# Patient Record
Sex: Female | Born: 1945 | Race: White | Hispanic: No | Marital: Married | Smoking: Former smoker
Health system: Southern US, Community
[De-identification: ages and names within clinical notes are randomized; demographics above are authoritative.]

## PROBLEM LIST (undated history)

## (undated) DIAGNOSIS — R945 Abnormal results of liver function studies: Secondary | ICD-10-CM

## (undated) DIAGNOSIS — H9190 Unspecified hearing loss, unspecified ear: Secondary | ICD-10-CM

## (undated) DIAGNOSIS — I10 Essential (primary) hypertension: Secondary | ICD-10-CM

## (undated) DIAGNOSIS — R739 Hyperglycemia, unspecified: Secondary | ICD-10-CM

## (undated) DIAGNOSIS — K449 Diaphragmatic hernia without obstruction or gangrene: Secondary | ICD-10-CM

## (undated) DIAGNOSIS — J189 Pneumonia, unspecified organism: Secondary | ICD-10-CM

## (undated) DIAGNOSIS — K635 Polyp of colon: Secondary | ICD-10-CM

## (undated) DIAGNOSIS — C801 Malignant (primary) neoplasm, unspecified: Secondary | ICD-10-CM

## (undated) DIAGNOSIS — D510 Vitamin B12 deficiency anemia due to intrinsic factor deficiency: Secondary | ICD-10-CM

## (undated) DIAGNOSIS — J449 Chronic obstructive pulmonary disease, unspecified: Secondary | ICD-10-CM

## (undated) DIAGNOSIS — Z86010 Personal history of colonic polyps: Secondary | ICD-10-CM

## (undated) DIAGNOSIS — R7989 Other specified abnormal findings of blood chemistry: Secondary | ICD-10-CM

## (undated) DIAGNOSIS — E785 Hyperlipidemia, unspecified: Secondary | ICD-10-CM

## (undated) DIAGNOSIS — K759 Inflammatory liver disease, unspecified: Secondary | ICD-10-CM

## (undated) DIAGNOSIS — H269 Unspecified cataract: Secondary | ICD-10-CM

## (undated) DIAGNOSIS — R0600 Dyspnea, unspecified: Secondary | ICD-10-CM

## (undated) HISTORY — PX: CATARACT EXTRACTION W/ INTRAOCULAR LENS  IMPLANT, BILATERAL: SHX1307

## (undated) HISTORY — DX: Abnormal results of liver function studies: R94.5

## (undated) HISTORY — DX: Hyperlipidemia, unspecified: E78.5

## (undated) HISTORY — DX: Hyperglycemia, unspecified: R73.9

## (undated) HISTORY — DX: Unspecified hearing loss, unspecified ear: H91.90

## (undated) HISTORY — PX: TOENAIL EXCISION: SUR558

## (undated) HISTORY — DX: Essential (primary) hypertension: I10

## (undated) HISTORY — DX: Vitamin B12 deficiency anemia due to intrinsic factor deficiency: D51.0

## (undated) HISTORY — DX: Diaphragmatic hernia without obstruction or gangrene: K44.9

## (undated) HISTORY — DX: Polyp of colon: K63.5

## (undated) HISTORY — DX: Unspecified cataract: H26.9

## (undated) HISTORY — DX: Other specified abnormal findings of blood chemistry: R79.89

## (undated) HISTORY — PX: COLONOSCOPY: SHX174

## (undated) HISTORY — DX: Personal history of colonic polyps: Z86.010

---

## 1966-01-16 HISTORY — PX: APPENDECTOMY: SHX54

## 1966-01-16 HISTORY — PX: OVARIAN CYST SURGERY: SHX726

## 1977-01-16 HISTORY — PX: TUBAL LIGATION: SHX77

## 2003-10-07 ENCOUNTER — Encounter: Payer: Self-pay | Admitting: Internal Medicine

## 2004-01-17 LAB — HM COLONOSCOPY

## 2004-06-06 ENCOUNTER — Ambulatory Visit: Payer: Self-pay | Admitting: Internal Medicine

## 2004-06-07 ENCOUNTER — Ambulatory Visit: Payer: Self-pay | Admitting: Internal Medicine

## 2004-06-07 ENCOUNTER — Ambulatory Visit: Payer: Self-pay | Admitting: Gastroenterology

## 2004-06-22 ENCOUNTER — Ambulatory Visit: Payer: Self-pay | Admitting: Gastroenterology

## 2004-07-04 ENCOUNTER — Encounter: Payer: Self-pay | Admitting: Internal Medicine

## 2004-07-04 ENCOUNTER — Ambulatory Visit: Payer: Self-pay | Admitting: Gastroenterology

## 2004-07-04 ENCOUNTER — Encounter (INDEPENDENT_AMBULATORY_CARE_PROVIDER_SITE_OTHER): Payer: Self-pay | Admitting: Specialist

## 2004-07-13 ENCOUNTER — Ambulatory Visit: Payer: Self-pay | Admitting: Internal Medicine

## 2004-11-14 ENCOUNTER — Other Ambulatory Visit: Admission: RE | Admit: 2004-11-14 | Discharge: 2004-11-14 | Payer: Self-pay | Admitting: Obstetrics and Gynecology

## 2004-12-20 ENCOUNTER — Ambulatory Visit: Payer: Self-pay | Admitting: Internal Medicine

## 2005-01-23 ENCOUNTER — Ambulatory Visit: Payer: Self-pay | Admitting: Internal Medicine

## 2005-07-24 ENCOUNTER — Ambulatory Visit: Payer: Self-pay | Admitting: Internal Medicine

## 2005-07-31 ENCOUNTER — Ambulatory Visit: Payer: Self-pay | Admitting: Internal Medicine

## 2005-08-04 ENCOUNTER — Ambulatory Visit: Payer: Self-pay

## 2005-08-04 ENCOUNTER — Encounter: Payer: Self-pay | Admitting: Internal Medicine

## 2005-11-10 ENCOUNTER — Ambulatory Visit: Payer: Self-pay | Admitting: Internal Medicine

## 2006-01-31 ENCOUNTER — Ambulatory Visit: Payer: Self-pay | Admitting: Internal Medicine

## 2006-01-31 LAB — CONVERTED CEMR LAB
ALT: 33 units/L (ref 0–40)
AST: 29 units/L (ref 0–37)
Albumin: 3.9 g/dL (ref 3.5–5.2)
Alkaline Phosphatase: 65 units/L (ref 39–117)
BUN: 18 mg/dL (ref 6–23)
Bilirubin, Direct: 0.1 mg/dL (ref 0.0–0.3)
CO2: 28 meq/L (ref 19–32)
Calcium: 9.5 mg/dL (ref 8.4–10.5)
Chloride: 100 meq/L (ref 96–112)
Cholesterol: 163 mg/dL (ref 0–200)
Creatinine, Ser: 0.9 mg/dL (ref 0.4–1.2)
GFR calc Af Amer: 82 mL/min
GFR calc non Af Amer: 67 mL/min
Glucose, Bld: 124 mg/dL — ABNORMAL HIGH (ref 70–99)
HDL: 40.1 mg/dL (ref >39.0)
Hgb A1c MFr Bld: 6.3 % — ABNORMAL HIGH (ref 4.6–6.0)
LDL Cholesterol: 92 mg/dL (ref 0–99)
Potassium: 3.6 meq/L (ref 3.5–5.1)
Sodium: 139 meq/L (ref 135–145)
Total Bilirubin: 0.9 mg/dL (ref 0.3–1.2)
Total CHOL/HDL Ratio: 4.1
Total Protein: 6.7 g/dL (ref 6.0–8.3)
Triglycerides: 156 mg/dL — ABNORMAL HIGH (ref 0–149)
VLDL: 31 mg/dL (ref 0–40)

## 2006-02-13 ENCOUNTER — Ambulatory Visit: Payer: Self-pay | Admitting: Pulmonary Disease

## 2006-05-02 ENCOUNTER — Ambulatory Visit: Payer: Self-pay | Admitting: Internal Medicine

## 2006-05-02 LAB — CONVERTED CEMR LAB
ALT: 32 units/L (ref 0–40)
AST: 28 units/L (ref 0–37)
Albumin: 4.2 g/dL (ref 3.5–5.2)
Alkaline Phosphatase: 65 units/L (ref 39–117)
BUN: 14 mg/dL (ref 6–23)
Bilirubin, Direct: 0.1 mg/dL (ref 0.0–0.3)
CO2: 29 meq/L (ref 19–32)
Calcium: 9.5 mg/dL (ref 8.4–10.5)
Chloride: 102 meq/L (ref 96–112)
Cholesterol: 174 mg/dL (ref 0–200)
Creatinine, Ser: 0.8 mg/dL (ref 0.4–1.2)
GFR calc Af Amer: 93 mL/min
GFR calc non Af Amer: 77 mL/min
Glucose, Bld: 102 mg/dL — ABNORMAL HIGH (ref 70–99)
HDL: 42.5 mg/dL (ref >39.0)
Hgb A1c MFr Bld: 6.2 % — ABNORMAL HIGH (ref 4.6–6.0)
LDL Cholesterol: 101 mg/dL — ABNORMAL HIGH (ref 0–99)
Potassium: 3.6 meq/L (ref 3.5–5.1)
Sodium: 143 meq/L (ref 135–145)
Total Bilirubin: 0.8 mg/dL (ref 0.3–1.2)
Total CHOL/HDL Ratio: 4.1
Total Protein: 7.3 g/dL (ref 6.0–8.3)
Triglycerides: 151 mg/dL — ABNORMAL HIGH (ref 0–149)
VLDL: 30 mg/dL (ref 0–40)

## 2006-08-09 DIAGNOSIS — E785 Hyperlipidemia, unspecified: Secondary | ICD-10-CM | POA: Insufficient documentation

## 2006-08-09 DIAGNOSIS — I10 Essential (primary) hypertension: Secondary | ICD-10-CM | POA: Insufficient documentation

## 2006-08-09 DIAGNOSIS — Z860100 Personal history of colon polyps, unspecified: Secondary | ICD-10-CM

## 2006-08-09 DIAGNOSIS — M810 Age-related osteoporosis without current pathological fracture: Secondary | ICD-10-CM | POA: Insufficient documentation

## 2006-08-09 DIAGNOSIS — D510 Vitamin B12 deficiency anemia due to intrinsic factor deficiency: Secondary | ICD-10-CM | POA: Insufficient documentation

## 2006-08-09 HISTORY — DX: Personal history of colon polyps, unspecified: Z86.0100

## 2006-08-09 HISTORY — DX: Personal history of colonic polyps: Z86.010

## 2006-08-21 DIAGNOSIS — K7689 Other specified diseases of liver: Secondary | ICD-10-CM | POA: Insufficient documentation

## 2006-09-11 ENCOUNTER — Ambulatory Visit: Payer: Self-pay | Admitting: Internal Medicine

## 2006-09-12 LAB — CONVERTED CEMR LAB
BUN: 13 mg/dL (ref 6–23)
Basophils Absolute: 0.1 10*3/uL (ref 0.0–0.1)
Basophils Relative: 0.7 % (ref 0.0–1.0)
CO2: 30 meq/L (ref 19–32)
Calcium: 9.7 mg/dL (ref 8.4–10.5)
Chloride: 100 meq/L (ref 96–112)
Cholesterol: 168 mg/dL (ref 0–200)
Creatinine, Ser: 0.8 mg/dL (ref 0.4–1.2)
Direct LDL: 103.7 mg/dL
Eosinophils Absolute: 0.2 10*3/uL (ref 0.0–0.6)
Eosinophils Relative: 2.7 % (ref 0.0–5.0)
Folate: >20 ng/mL
GFR calc Af Amer: 93 mL/min
GFR calc non Af Amer: 77 mL/min
Glucose, Bld: 109 mg/dL — ABNORMAL HIGH (ref 70–99)
HCT: 42.4 % (ref 36.0–46.0)
HDL: 34.7 mg/dL — ABNORMAL LOW (ref >39.0)
Hemoglobin: 14.5 g/dL (ref 12.0–15.0)
Hgb A1c MFr Bld: 6.3 % — ABNORMAL HIGH (ref 4.6–6.0)
Lymphocytes Relative: 24.9 % (ref 12.0–46.0)
MCHC: 34.2 g/dL (ref 30.0–36.0)
MCV: 83.9 fL (ref 78.0–100.0)
Monocytes Absolute: 0.8 10*3/uL — ABNORMAL HIGH (ref 0.2–0.7)
Monocytes Relative: 9.7 % (ref 3.0–11.0)
Neutro Abs: 5 10*3/uL (ref 1.4–7.7)
Neutrophils Relative %: 62 % (ref 43.0–77.0)
Platelets: 260 10*3/uL (ref 150–400)
Potassium: 4.5 meq/L (ref 3.5–5.1)
RBC: 5.06 M/uL (ref 3.87–5.11)
RDW: 12.8 % (ref 11.5–14.6)
Sodium: 138 meq/L (ref 135–145)
Total CHOL/HDL Ratio: 4.8
Triglycerides: 201 mg/dL (ref 0–149)
VLDL: 40 mg/dL (ref 0–40)
Vitamin B-12: 336 pg/mL (ref 211–911)
WBC: 8.1 10*3/uL (ref 4.5–10.5)

## 2006-10-26 ENCOUNTER — Ambulatory Visit: Payer: Self-pay | Admitting: Internal Medicine

## 2007-01-22 ENCOUNTER — Ambulatory Visit: Payer: Self-pay | Admitting: Internal Medicine

## 2007-01-22 LAB — CONVERTED CEMR LAB
ALT: 31 units/L (ref 0–35)
AST: 25 units/L (ref 0–37)
Alkaline Phosphatase: 63 units/L (ref 39–117)
Chloride: 103 meq/L (ref 96–112)
Cholesterol: 178 mg/dL (ref 0–200)
Creatinine, Ser: 0.9 mg/dL (ref 0.4–1.2)
Glucose, Bld: 113 mg/dL — ABNORMAL HIGH (ref 70–99)
HDL: 36.3 mg/dL — ABNORMAL LOW (ref >39.0)
LDL Cholesterol: 109 mg/dL — ABNORMAL HIGH (ref 0–99)
Sodium: 141 meq/L (ref 135–145)
Total Bilirubin: 0.9 mg/dL (ref 0.3–1.2)
Total Protein: 6.5 g/dL (ref 6.0–8.3)
Triglycerides: 164 mg/dL — ABNORMAL HIGH (ref 0–149)

## 2007-02-01 ENCOUNTER — Ambulatory Visit: Payer: Self-pay | Admitting: Internal Medicine

## 2007-05-24 ENCOUNTER — Ambulatory Visit: Payer: Self-pay | Admitting: Internal Medicine

## 2007-08-01 ENCOUNTER — Ambulatory Visit: Payer: Self-pay | Admitting: Internal Medicine

## 2007-08-01 LAB — CONVERTED CEMR LAB
AST: 27 units/L (ref 0–37)
BUN: 15 mg/dL (ref 6–23)
Chloride: 99 meq/L (ref 96–112)
Cholesterol: 169 mg/dL (ref 0–200)
Creatinine, Ser: 0.8 mg/dL (ref 0.4–1.2)
GFR calc non Af Amer: 77 mL/min
LDL Cholesterol: 103 mg/dL — ABNORMAL HIGH (ref 0–99)
Total Protein: 7 g/dL (ref 6.0–8.3)
Triglycerides: 146 mg/dL (ref 0–149)
VLDL: 29 mg/dL (ref 0–40)

## 2007-08-13 ENCOUNTER — Ambulatory Visit: Payer: Self-pay | Admitting: Internal Medicine

## 2007-10-30 ENCOUNTER — Ambulatory Visit: Payer: Self-pay | Admitting: Internal Medicine

## 2008-01-28 ENCOUNTER — Ambulatory Visit: Payer: Self-pay | Admitting: Internal Medicine

## 2008-01-28 LAB — CONVERTED CEMR LAB
ALT: 28 units/L (ref 0–35)
AST: 25 units/L (ref 0–37)
Albumin: 4 g/dL (ref 3.5–5.2)
Alkaline Phosphatase: 65 units/L (ref 39–117)
BUN: 20 mg/dL (ref 6–23)
Bilirubin, Direct: 0.1 mg/dL (ref 0.0–0.3)
CO2: 32 meq/L (ref 19–32)
Chloride: 101 meq/L (ref 96–112)
Glucose, Bld: 114 mg/dL — ABNORMAL HIGH (ref 70–99)
HDL: 39.7 mg/dL (ref >39.0)
Potassium: 4.3 meq/L (ref 3.5–5.1)
Sodium: 141 meq/L (ref 135–145)
Total Protein: 6.9 g/dL (ref 6.0–8.3)

## 2008-02-03 ENCOUNTER — Ambulatory Visit: Payer: Self-pay | Admitting: Internal Medicine

## 2008-04-27 ENCOUNTER — Ambulatory Visit: Payer: Self-pay | Admitting: Internal Medicine

## 2008-04-27 LAB — CONVERTED CEMR LAB
ALT: 25 units/L (ref 0–35)
AST: 23 units/L (ref 0–37)
Bilirubin, Direct: 0.1 mg/dL (ref 0.0–0.3)
Cholesterol: 147 mg/dL (ref 0–200)
Total Bilirubin: 0.7 mg/dL (ref 0.3–1.2)
Total Protein: 7.1 g/dL (ref 6.0–8.3)

## 2008-05-04 ENCOUNTER — Ambulatory Visit: Payer: Self-pay | Admitting: Internal Medicine

## 2008-05-21 ENCOUNTER — Ambulatory Visit: Payer: Self-pay | Admitting: Internal Medicine

## 2008-07-27 ENCOUNTER — Ambulatory Visit: Payer: Self-pay | Admitting: Internal Medicine

## 2008-07-28 LAB — CONVERTED CEMR LAB
AST: 26 units/L (ref 0–37)
Alkaline Phosphatase: 60 units/L (ref 39–117)
BUN: 16 mg/dL (ref 6–23)
Basophils Absolute: 0.1 10*3/uL (ref 0.0–0.1)
Bilirubin, Direct: 0 mg/dL (ref 0.0–0.3)
Chloride: 104 meq/L (ref 96–112)
Cholesterol: 183 mg/dL (ref 0–200)
Creatinine, Ser: 0.7 mg/dL (ref 0.4–1.2)
Direct LDL: 105.8 mg/dL
Eosinophils Absolute: 0.2 10*3/uL (ref 0.0–0.7)
Eosinophils Relative: 2.8 % (ref 0.0–5.0)
Glucose, Bld: 117 mg/dL — ABNORMAL HIGH (ref 70–99)
Hemoglobin: 14.5 g/dL (ref 12.0–15.0)
Lymphs Abs: 1.7 10*3/uL (ref 0.7–4.0)
MCHC: 34.2 g/dL (ref 30.0–36.0)
MCV: 85.1 fL (ref 78.0–100.0)
Monocytes Absolute: 0.6 10*3/uL (ref 0.1–1.0)
Neutro Abs: 4.7 10*3/uL (ref 1.4–7.7)
Potassium: 4.7 meq/L (ref 3.5–5.1)
RBC: 4.99 M/uL (ref 3.87–5.11)
Total Protein: 7.3 g/dL (ref 6.0–8.3)
VLDL: 56.2 mg/dL — ABNORMAL HIGH (ref 0.0–40.0)

## 2008-08-31 ENCOUNTER — Telehealth: Payer: Self-pay | Admitting: Internal Medicine

## 2008-11-25 ENCOUNTER — Telehealth: Payer: Self-pay | Admitting: Internal Medicine

## 2008-11-25 DIAGNOSIS — H9190 Unspecified hearing loss, unspecified ear: Secondary | ICD-10-CM

## 2008-11-25 HISTORY — DX: Unspecified hearing loss, unspecified ear: H91.90

## 2008-12-09 ENCOUNTER — Encounter (INDEPENDENT_AMBULATORY_CARE_PROVIDER_SITE_OTHER): Payer: Self-pay | Admitting: *Deleted

## 2009-01-16 HISTORY — PX: OTHER SURGICAL HISTORY: SHX169

## 2009-01-16 HISTORY — PX: RETINAL DETACHMENT SURGERY: SHX105

## 2009-01-20 ENCOUNTER — Ambulatory Visit: Payer: Self-pay | Admitting: Internal Medicine

## 2009-01-20 LAB — CONVERTED CEMR LAB
Alkaline Phosphatase: 53 units/L (ref 39–117)
BUN: 15 mg/dL (ref 6–23)
Bilirubin, Direct: 0 mg/dL (ref 0.0–0.3)
CO2: 30 meq/L (ref 19–32)
Chloride: 104 meq/L (ref 96–112)
Cholesterol: 159 mg/dL (ref 0–200)
Creatinine, Ser: 0.8 mg/dL (ref 0.4–1.2)
Glucose, Bld: 116 mg/dL — ABNORMAL HIGH (ref 70–99)
LDL Cholesterol: 84 mg/dL (ref 0–99)
Potassium: 4.5 meq/L (ref 3.5–5.1)
Total Bilirubin: 0.9 mg/dL (ref 0.3–1.2)
VLDL: 33.4 mg/dL (ref 0.0–40.0)

## 2009-01-28 ENCOUNTER — Ambulatory Visit: Payer: Self-pay | Admitting: Internal Medicine

## 2009-01-29 ENCOUNTER — Telehealth: Payer: Self-pay | Admitting: Gastroenterology

## 2009-01-29 ENCOUNTER — Ambulatory Visit: Payer: Self-pay | Admitting: Gastroenterology

## 2009-01-29 LAB — CONVERTED CEMR LAB
Basophils Relative: 0.7 % (ref 0.0–3.0)
Eosinophils Relative: 2.4 % (ref 0.0–5.0)
Folate: >20 ng/mL
Hemoglobin: 13.9 g/dL (ref 12.0–15.0)
Iron: 58 ug/dL (ref 42–145)
Lymphocytes Relative: 19.3 % (ref 12.0–46.0)
Lymphs Abs: 1.7 10*3/uL (ref 0.7–4.0)
MCHC: 32.3 g/dL (ref 30.0–36.0)
MCV: 87.7 fL (ref 78.0–100.0)
Monocytes Absolute: 0.7 10*3/uL (ref 0.1–1.0)
Monocytes Relative: 7.7 % (ref 3.0–12.0)
Neutro Abs: 6.1 10*3/uL (ref 1.4–7.7)
RBC: 4.93 M/uL (ref 3.87–5.11)

## 2009-02-02 ENCOUNTER — Encounter (INDEPENDENT_AMBULATORY_CARE_PROVIDER_SITE_OTHER): Payer: Self-pay | Admitting: *Deleted

## 2009-02-05 ENCOUNTER — Ambulatory Visit: Payer: Self-pay | Admitting: Gastroenterology

## 2009-02-05 DIAGNOSIS — K449 Diaphragmatic hernia without obstruction or gangrene: Secondary | ICD-10-CM

## 2009-02-05 HISTORY — DX: Diaphragmatic hernia without obstruction or gangrene: K44.9

## 2009-02-09 ENCOUNTER — Ambulatory Visit (HOSPITAL_COMMUNITY): Admission: RE | Admit: 2009-02-09 | Discharge: 2009-02-09 | Payer: Self-pay | Admitting: Gastroenterology

## 2009-02-12 ENCOUNTER — Encounter: Payer: Self-pay | Admitting: Gastroenterology

## 2009-06-09 ENCOUNTER — Encounter (INDEPENDENT_AMBULATORY_CARE_PROVIDER_SITE_OTHER): Payer: Self-pay | Admitting: *Deleted

## 2009-07-23 ENCOUNTER — Ambulatory Visit: Payer: Self-pay | Admitting: Internal Medicine

## 2009-07-23 LAB — CONVERTED CEMR LAB
ALT: 23 units/L (ref 0–35)
AST: 21 units/L (ref 0–37)
Albumin: 4.2 g/dL (ref 3.5–5.2)
Alkaline Phosphatase: 62 units/L (ref 39–117)
CO2: 31 meq/L (ref 19–32)
Calcium: 9.3 mg/dL (ref 8.4–10.5)
Cholesterol: 166 mg/dL (ref 0–200)
Creatinine, Ser: 0.7 mg/dL (ref 0.4–1.2)
GFR calc non Af Amer: 84.79 mL/min (ref >60)
HDL: 46.6 mg/dL (ref >39.00)
Hgb A1c MFr Bld: 6.3 % (ref 4.6–6.5)
Sodium: 143 meq/L (ref 135–145)
Total CHOL/HDL Ratio: 4
Total Protein: 7.1 g/dL (ref 6.0–8.3)
Triglycerides: 138 mg/dL (ref 0.0–149.0)

## 2009-07-28 ENCOUNTER — Ambulatory Visit: Payer: Self-pay | Admitting: Internal Medicine

## 2009-07-28 DIAGNOSIS — M79609 Pain in unspecified limb: Secondary | ICD-10-CM | POA: Insufficient documentation

## 2009-07-29 ENCOUNTER — Telehealth (INDEPENDENT_AMBULATORY_CARE_PROVIDER_SITE_OTHER): Payer: Self-pay | Admitting: *Deleted

## 2009-08-02 ENCOUNTER — Encounter: Payer: Self-pay | Admitting: Cardiovascular Disease

## 2009-08-02 ENCOUNTER — Ambulatory Visit: Payer: Self-pay

## 2009-08-02 ENCOUNTER — Encounter (HOSPITAL_COMMUNITY): Admission: RE | Admit: 2009-08-02 | Discharge: 2009-09-22 | Payer: Self-pay | Admitting: Internal Medicine

## 2009-08-02 ENCOUNTER — Ambulatory Visit: Payer: Self-pay | Admitting: Cardiovascular Disease

## 2009-08-03 ENCOUNTER — Encounter: Payer: Self-pay | Admitting: Internal Medicine

## 2009-08-09 ENCOUNTER — Telehealth: Payer: Self-pay | Admitting: *Deleted

## 2010-01-16 LAB — HM MAMMOGRAPHY

## 2010-02-09 ENCOUNTER — Ambulatory Visit
Admission: RE | Admit: 2010-02-09 | Discharge: 2010-02-09 | Payer: Self-pay | Source: Home / Self Care | Attending: Internal Medicine | Admitting: Internal Medicine

## 2010-02-09 ENCOUNTER — Other Ambulatory Visit: Payer: Self-pay | Admitting: Internal Medicine

## 2010-02-09 LAB — BASIC METABOLIC PANEL
BUN: 19 mg/dL (ref 6–23)
Creatinine, Ser: 0.7 mg/dL (ref 0.4–1.2)
GFR: 87.41 mL/min (ref >60.00)

## 2010-02-09 LAB — HEPATIC FUNCTION PANEL
AST: 20 U/L (ref 0–37)
Alkaline Phosphatase: 56 U/L (ref 39–117)
Bilirubin, Direct: 0.1 mg/dL (ref 0.0–0.3)
Total Bilirubin: 0.8 mg/dL (ref 0.3–1.2)

## 2010-02-09 LAB — LIPID PANEL
LDL Cholesterol: 79 mg/dL (ref 0–99)
VLDL: 35.2 mg/dL (ref 0.0–40.0)

## 2010-02-14 ENCOUNTER — Ambulatory Visit: Admit: 2010-02-14 | Payer: Self-pay | Admitting: Internal Medicine

## 2010-02-15 NOTE — Progress Notes (Signed)
Summary: Triage   Phone Note Call from Patient Call back at Home Phone [phone]   Caller: Patient Call For: Dr. Jarold Motto Reason for Call: Talk to Nurse Summary of Call: Pt. is calling about scheduling a ultrasound at [facility]  Initial call taken by: Karna Christmas,  January 29, 2009 11:17 AM  Follow-up for Phone Call        Left message for pt to call.  Lupita Leash Surface RN  January 29, 2009 12:55 PM  Egd and [place] scheduled.  Pt aware of appts.  Pt will stop by tomorrow for instructions re Egd.  Follow-up by: Ashok Cordia RN,  February 02, 2009 11:55 AM

## 2010-02-15 NOTE — Assessment & Plan Note (Signed)
Summary: 6 month rov/njr   Vital Signs:  Patient profile:   65 year old female Weight:      179 pounds Temp:     98.6 degrees F oral Pulse rate:   68 / minute Pulse rhythm:   regular Resp:     14 per minute BP sitting:   126 / 74  (left arm) Cuff size:   regular  Vitals Entered By: Gladis Riffle, RN (July 28, 2009 9:11 AM) CC: 6 month rov, labs done-- Is Patient Diabetic? No   Primary Care Provider:  Birdie Sons, MD  CC:  6 month rov and labs done--.  History of Present Illness:  Follow-Up Visit      This is a 65 year old woman who presents for Follow-up visit.  The patient denies chest pain and palpitations.  Since the last visit the patient notes no new problems or concerns---has continued tingling and subjective weakness of left hand (she is right handed).  The patient reports taking meds as prescribed.  When questioned about possible medication side effects, the patient notes none.    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 Take 1 Tablet By Mouth Every Other Day 2)  Crestor 20 Mg Tabs (Rosuvastatin Calcium) .Marland Kitchen.. 1 Tablet By Mouth Daily or As Directed 3)  Cyanocobalamin 1000 Mcg/ml Inj Soln (Cyanocobalamin) .Marland Kitchen.. 1033mcg/month Subcutaneously 4)  Caltrate 600+d 600-400 Mg-Unit  Tabs (Calcium Carbonate-Vitamin D) .... Once Daily 5)  Vitamin D 1000 Unit  Caps (Cholecalciferol) .... Once Daily 6)  Micardis Hct 80-25 Mg Tabs (Telmisartan-Hctz) .Marland Kitchen.. 1 By  Mouth Daily or As Directed 7)  Levsin/sl 0.125 Mg  Subl (Hyoscyamine Sulfate) .Marland Kitchen.. 1 Sl Q 4-6 Hrs Prn  Allergies (verified): 1)  ! Keflex (Cephalexin)  Past History:  Past Medical History: Last updated: 08/21/2006 Hyperlipidemia Hypertension Osteoporosis Colonic polyps, hx of pernicious anemia elevated LFTs  obstructive disease  Family History: Last updated: 01/29/2009 mother alive pernicious anemai (hx of anemia) father-decease 82 yo lung cancer/bone cancer Family History Osteoporosis hypothyroidism No FH of Colon Cancer:  Social History: Last updated: 01/29/2009 Retired  Charity fundraiser  Married  2 boys and 1 girl Former Smoker Alcohol Use - yes  2-3 per week Illicit Drug Use - no  Risk Factors: Smoking Status: quit (07/28/2009)  Past Surgical History: ovarian cyst, 1968 Appendectomy, incendental  1968 Cataract Surgery bilateral left retina surgery  Physical Exam  General:  alert and well-developed.   Head:  normocephalic and atraumatic.   Eyes:  pupils equal and pupils round.   Ears:  R ear normal and L ear normal.   Neck:  No deformities, masses, or tenderness noted. Chest Wall:  No deformities, masses, or tenderness noted. Lungs:  Normal respiratory effort, chest expands symmetrically. Lungs are clear to auscultation, no crackles or wheezes. Heart:  normal rate and regular rhythm.   Abdomen:  Bowel sounds positive,abdomen soft and non-tender without masses, organomegaly or hernias noted.  overweight Msk:  normal  ROM and no joint tenderness.   Neurologic:  cranial nerves II-XII intact and strength normal in all extremities.   Skin:  turgor normal and color normal.     Impression & Recommendations:  Problem # 1:  HYPERGLYCEMIA (ICD-790.29) labs ok Orders: Cardiolite (Cardiolite)  Problem # 2:  DISORDER, LIVER NEC (ICD-573.8) labs normal  Problem # 3:  ANEMIA, PERNICIOUS (ICD-281.0)  continue treatment Her updated medication list for this problem  includes:    Cyanocobalamin 1000 Mcg/ml Inj Soln (Cyanocobalamin) .Marland KitchenMarland KitchenMarland KitchenMarland Kitchen 1054mcg/month IM  Orders: Cardiolite (Cardiolite)  Problem # 4:  HYPERTENSION (ICD-401.9) controlled continue current medications  Her updated medication list for this problem includes:    Micardis Hct 80-25 Mg Tabs (Telmisartan-hctz) .Marland Kitchen... 1 by mouth daily or as directed  BP today: 126/74 Prior BP: 120/68 (01/29/2009)  Labs Reviewed: K+: 5.1 (07/23/2009) Creat: : 0.7 (07/23/2009)   Chol: 166 (07/23/2009)   HDL: 46.60 (07/23/2009)   LDL: 92 (07/23/2009)   TG: 138.0 (07/23/2009)  Orders: Cardiolite (Cardiolite)  Complete Medication List: 1)  Aspirin Ec 81 Mg Tbec (Aspirin) .... Take 1 tablet by mouth every other day 2)  Crestor 20 Mg Tabs (Rosuvastatin calcium) .Marland Kitchen.. 1 tablet by mouth daily or as directed 3)  Cyanocobalamin 1000 Mcg/ml Inj Soln (Cyanocobalamin) .Marland Kitchen.. 1040mcg/month subcutaneously 4)  Caltrate 600+d 600-400 Mg-unit Tabs (Calcium carbonate-vitamin d) .... Once daily 5)  Vitamin D 1000 Unit Caps (cholecalciferol)  .... Once daily 6)  Micardis Hct 80-25 Mg Tabs (Telmisartan-hctz) .Marland Kitchen.. 1 by mouth daily or as directed 7)  Levsin/sl 0.125 Mg Subl (Hyoscyamine sulfate) .Marland Kitchen.. 1 sl q 4-6 hrs prn  Other Orders: Orthopedic Referral (Ortho)  Patient Instructions: 1)  Please schedule a follow-up appointment in 6 months. 2)  labs one week prior to visit 3)  lipids---272.4 4)  lfts-995.2 5)  bmet-995.2 6)  A1C-250.02 7)

## 2010-02-15 NOTE — Assessment & Plan Note (Signed)
Summary: 6 mo rov/mm   Vital Signs:  Patient profile:   66 year old female Weight:      185 pounds Temp:     98.6 degrees F Pulse rate:   68 / minute BP sitting:   124 / 76  (left arm)  Vitals Entered By: Gladis Riffle, RN (January 28, 2009 8:08 AM)   History of Present Illness:  Follow-Up Visit      This is a 65 year old woman who presents for Follow-up visit.  The patient denies chest pain, palpitations, dizziness, syncope, edema, SOB, DOE, PND, and orthopnea.  Since the last visit the patient notes no new problems or concerns.  The patient reports taking meds as prescribed.  When questioned about possible medication side effects, the patient notes none.    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 Take 1 Tablet By Mouth Once A Week 2)  Aspirin Ec 81 Mg Tbec (Aspirin) .... Take 1 Tablet By Mouth Every Other Day 3)  Crestor 20 Mg Tabs (Rosuvastatin Calcium) .Marland Kitchen.. 1 Tablet By Mouth Daily or As Directed 4)  Cyanocobalamin 1000 Mcg/ml Inj Soln (Cyanocobalamin) .Marland Kitchen.. 1077mcg/month Subcutaneously 5)  Caltrate 600+d 600-400 Mg-Unit  Tabs (Calcium Carbonate-Vitamin D) .... Once Daily 6)  Vitamin D 1000 Unit  Caps (Cholecalciferol) .... Once Daily 7)  Cinnamon 500 Mg  Caps (Cinnamon) .... Two Times A Day--Does Not Always Remember 8)  Micardis Hct 80-25 Mg Tabs (Telmisartan-Hctz) .Marland Kitchen.. 1 By Mouth Daily or As Directed  Allergies: 1)  !  Keflex (Cephalexin)  Comments:  Nurse/Medical Assistant: 6 month rov, labs done  The patient's medications and allergies were reviewed with the patient and were updated in the Medication and Allergy Lists. Gladis Riffle, RN (January 28, 2009 8:09 AM)  Past History:  Past Medical History: Last updated: 08/21/2006 Hyperlipidemia Hypertension Osteoporosis Colonic polyps, hx of pernicious anemia elevated LFTs  obstructive disease  Past Surgical History: Last updated: 08/09/2006 ovarian cyst, 1968 Appendectomy, incendental  1968  Family History: Last updated: 09/11/2006 mother alive pernicious anemai (hx of anemia) father-decease 70 yo lung cancer/bone cancer Family History Osteoporosis hypothyroidism  Social History: Last updated: 09/11/2006 Retired Married Former Smoker  Risk Factors: Smoking Status: quit (01/28/2009)  Review of Systems       All other systems reviewed and were negative   Physical Exam  General:  Well-developed,well-nourished,in no acute distress; alert,appropriate and cooperative throughout examination Head:  normocephalic and atraumatic.   Ears:  R ear normal and L ear normal.   Nose:  no external deformity and no external erythema.   Neck:  No deformities, masses, or tenderness noted. Chest Wall:  No deformities, masses, or tenderness noted. Lungs:  normal respiratory effort and no intercostal retractions.   Heart:  Normal rate and regular rhythm. S1 and S2 normal without gallop, murmur, click, rub or other extra sounds. Abdomen:  Bowel sounds positive,abdomen soft and non-tender without masses, organomegaly or hernias noted.  overweight Msk:  No deformity or scoliosis noted of thoracic or lumbar spine.   Pulses:  R radial normal and L radial normal.   Neurologic:  cranial nerves II-XII intact and gait normal.     Impression & Recommendations:  Problem # 1:  ANEMIA, PERNICIOUS (ICD-281.0) continue B12 injections The following  medications were removed from the medication list:    Folic Acid 1 Mg Tabs (Folic acid) .Marland Kitchen... Take 1 tablet by mouth once a day Her updated medication list for this problem includes:    Cyanocobalamin 1000 Mcg/ml Inj Soln (Cyanocobalamin) .Marland KitchenMarland KitchenMarland KitchenMarland Kitchen 1043mcg/month subcutaneously  Problem # 2:  HYPERTENSION (ICD-401.9) controlled continue current medications  Her updated medication list for this problem includes:    Micardis Hct 80-25 Mg Tabs (Telmisartan-hctz) .Marland Kitchen... 1 by mouth daily or as directed  BP today: 124/76 Prior BP: 114/76 (07/27/2008)  Labs Reviewed: K+: 4.5 (01/20/2009) Creat: : 0.8 (01/20/2009)   Chol: 159 (01/20/2009)   HDL: 41.60 (01/20/2009)   LDL: 84 (01/20/2009)   TG: 167.0 (01/20/2009)  Problem # 3:  HYPERLIPIDEMIA (ICD-272.4) controlled continue current medications  Her updated medication list for this problem includes:    Crestor 20 Mg Tabs (Rosuvastatin calcium) .Marland Kitchen... 1 tablet by mouth daily or as directed  Labs Reviewed: SGOT: 28 (01/20/2009)   SGPT: 29 (01/20/2009)   HDL:41.60 (01/20/2009), 40.70 (07/27/2008)  LDL:84 (01/20/2009), 78 (04/27/2008)  Chol:159 (01/20/2009), 183 (07/27/2008)  Trig:167.0 (01/20/2009), 281.0 (07/27/2008)  Problem # 4:  HYPERGLYCEMIA (ICD-790.29) note A1C will follow occassionally  Problem # 5:  ABDOMINAL PAIN (ICD-789.00)  Orders: Gastroenterology Referral (GI)  Complete Medication List: 1)  Alendronate Sodium 70 Mg Tabs (Alendronate sodium) .... Take 1 tablet by mouth once a week 2)  Aspirin Ec 81 Mg Tbec (Aspirin) .... Take 1 tablet by mouth every other day 3)  Crestor 20 Mg Tabs (Rosuvastatin calcium) .Marland Kitchen.. 1 tablet by mouth daily or as directed 4)  Cyanocobalamin 1000 Mcg/ml Inj Soln (Cyanocobalamin) .Marland Kitchen.. 1039mcg/month subcutaneously 5)  Caltrate 600+d 600-400 Mg-unit Tabs (Calcium carbonate-vitamin d) .... Once daily 6)  Vitamin D 1000 Unit Caps (cholecalciferol)  .... Once daily 7)  Micardis Hct 80-25 Mg Tabs  (Telmisartan-hctz) .Marland Kitchen.. 1 by mouth daily or as directed 8)  Levsin/sl 0.125 Mg Subl (Hyoscyamine sulfate) .Marland Kitchen.. 1 sl q 4-6 hrs prn  Other Orders: Pneumococcal Vaccine ([address]) Admin 1st Vaccine (60454)  Patient Instructions: 1)  Please schedule a follow-up appointment in 6 months. 2)  labs one week prior to visit 3)  lipids---272.4 4)  lfts-995.2 5)  bmet-995.2 6)  A1C-250.02 7)       Immunizations Administered:  Pneumonia Vaccine:    Vaccine Type: Pneumovax (Medicare)    Site: left deltoid    Mfr: Merck    Dose: 0.5 ml    Route: IM    Given by: Gladis Riffle, RN    Exp. Date: 02/11/2010    Lot #: [id number]    Immunizations Administered:  Pneumonia Vaccine:    Vaccine Type: Pneumovax (Medicare)    Site: left deltoid    Mfr: Merck    Dose: 0.5 ml    Route: IM    Given by: Gladis Riffle, RN    Exp. Date: 02/11/2010    Lot #: [id number]

## 2010-02-15 NOTE — Progress Notes (Signed)
Summary: Nuclear Pre-Procedure  Phone Note Outgoing Call Call back at [facility] Phone [phone]   Call placed by: Stanton Kidney, EMT-P,  July 29, 2009 4:00 PM Action Taken: Phone Call Completed Summary of Call: Left message with information on [employer] Information Sheet (see scanned document for details).     Nuclear Med Background Indications for Stress Test: Evaluation for Ischemia   History: Myocardial Perfusion Study  History Comments: '07 MPS: EF=74%, NL  Symptoms: SOB    Nuclear Pre-Procedure Cardiac Risk Factors: History of Smoking, Hypertension, Lipids Height (in): 64

## 2010-02-15 NOTE — Letter (Signed)
Summary: EGD Instructions  [facility]  [address] [place], [place] [address]   Phone: [phone]  Fax: [phone]       Cassandra James    1945/07/22    MRN: [id number]       Procedure Day Dorna Bloom: Firday, 02/05/09     Arrival Time: 2:30     Procedure Time: 3:30     Location of Procedure:                    _ X _ [facility] (4th Floor)     PREPARATION FOR ENDOSCOPY   On 02/05/09 THE DAY OF THE PROCEDURE:  1.   No solid foods, milk or milk products are allowed after midnight the night before your procedure.  2.   Do not drink anything colored red or purple.  Avoid juices with pulp.  No orange juice.  3.  You may drink clear liquids until 1:30 pm, which is 2 hours before your procedure.                                                                                                CLEAR LIQUIDS INCLUDE: Water Jello Ice Popsicles Tea (sugar ok, no milk/cream) Powdered fruit flavored drinks Coffee (sugar ok, no milk/cream) Gatorade Juice: apple, white grape, white cranberry  Lemonade Clear bullion, consomm, broth Carbonated beverages (any kind) Strained chicken noodle soup Hard Candy   MEDICATION INSTRUCTIONS  Unless otherwise instructed, you should take regular prescription medications with a small sip of water as early as possible the morning of your procedure.                     OTHER INSTRUCTIONS  You will need a responsible adult at least 67 years of age to accompany you and drive you home.   This person must remain in the waiting room during your procedure.  Wear loose fitting clothing that is easily removed.  Leave jewelry and other valuables at home.  However, you may wish to bring a book to read or an iPod/MP3 player to listen to music as you wait for your procedure to start.  Remove all body piercing jewelry and leave at home.  Total time from sign-in until discharge is approximately 2-3 hours.  You  should go home directly after your procedure and rest.  You can resume normal activities the day after your procedure.  The day of your procedure you should not:   Drive   Make legal decisions   Operate machinery   Drink alcohol   Return to work  You will receive specific instructions about eating, activities and medications before you leave.    The above instructions have been reviewed and explained to me by   _______________________    I fully understand and can verbalize these instructions _____________________________ Date _________

## 2010-02-15 NOTE — Progress Notes (Signed)
Summary: resend rx to a different pharmacy  Phone Note Call from Patient Call back at Home Phone [phone]   Caller: Patient---live call Summary of Call: [facility] is out of b12 vax. [facility]. Initial call taken by: Warnell Forester,  August 09, 2009 4:48 PM    Prescriptions: CYANOCOBALAMIN 1000 MCG/ML INJ SOLN (CYANOCOBALAMIN) 1043mcg/month subcutaneously  #12 Millilite x 0   Entered by:   Kern Reap CMA (AAMA)   Authorized by:   Birdie Sons MD   Signed by:   Kern Reap CMA (AAMA) on 08/09/2009   Method used:   Electronically to        [facility]  [place] [address] (retail)       [address]       [place], [place]  [address]       Ph: [phone] or [phone]       Fax: [phone]   RxID:   [id number]

## 2010-02-15 NOTE — Miscellaneous (Signed)
Summary: h pylori  Clinical Lists Changes  Problems: Added new problem of HIATAL HERNIA (ICD-553.3) Orders: Added new Test order of TLB-H Pylori Screen Gastric Biopsy (83013-CLOTEST) - Signed

## 2010-02-15 NOTE — Assessment & Plan Note (Signed)
Summary: CHRONIC ABD PAIN/YF    History of Present Illness Visit Type: Initial Consult Primary GI MD: Sheryn Bison MD FACP FAGA Primary Provider: Birdie Sons, MD Requesting Provider: Birdie Sons, MD Chief Complaint: severe abdominal pain upper abdomen  History of Present Illness:   65 year old white female with 5 years of recurrent intermittent left upper quadrant pain precipitated by movement which will last 15-20 minutes in duration this followed by one day of abdominal tenderness. Otherwise no precipitating or alleviating elements were relationship to gastrointestinal symptomatology. She does have chronic B12 deficiency her mother had pernicious anemia. She is on B12 replacement shots subcutaneously. She specifically denies dyspepsia, reflux symptoms, dysphagia, fibular complaints, or any history of known gallbladder or liver disease. Primary care followup has been unremarkable except for mild osteopenia, hypertension, and hyperlipidemia. Colonoscopy in the last several years was normal except for diverticulosis. She has regular bowel movements without melena or hematochezia. She denies anorexia, weight loss, or specific food intolerances. Review of systems otherwise today is noncontributory for any cardiovascular, pulmonary, genitourinary, gynecologic problems. She is retired Charity fundraiser. She has had previous appendectomy.   GI Review of Systems    Reports abdominal pain and  bloating.     Location of  Abdominal pain: upper abdomen.    Denies acid reflux, belching, chest pain, dysphagia with liquids, dysphagia with solids, heartburn, loss of appetite, nausea, vomiting, vomiting blood, weight loss, and  weight gain.        Denies anal fissure, black tarry stools, change in bowel habit, constipation, diarrhea, diverticulosis, fecal incontinence, heme positive stool, hemorrhoids, irritable bowel syndrome, jaundice, light color stool, liver problems, rectal bleeding, and  rectal pain.    Current  Medications (verified): 1)  Alendronate Sodium 70 Mg Tabs (Alendronate Sodium) .... Take 1 Tablet By Mouth Once A Week 2)  Aspirin Ec 81 Mg Tbec (Aspirin) .... Take 1 Tablet By Mouth Every Other Day 3)  Crestor 20 Mg Tabs (Rosuvastatin Calcium) .Marland Kitchen.. 1 Tablet By Mouth Daily or As Directed 4)  Cyanocobalamin 1000 Mcg/ml Inj Soln (Cyanocobalamin) .Marland Kitchen.. 1058mcg/month Subcutaneously 5)  Caltrate 600+d 600-400 Mg-Unit  Tabs (Calcium Carbonate-Vitamin D) .... Once Daily 6)  Vitamin D 1000 Unit  Caps (Cholecalciferol) .... Once Daily 7)  Micardis Hct 80-25 Mg Tabs (Telmisartan-Hctz) .Marland Kitchen.. 1 By Mouth Daily or As Directed  Allergies (verified): 1)  ! Keflex (Cephalexin)  Past History:  Past medical, surgical, family and social histories (including risk factors) reviewed for relevance to current acute and chronic problems.  Past Medical History: Reviewed history from 08/21/2006 and no changes required. Hyperlipidemia Hypertension Osteoporosis Colonic polyps, hx of pernicious anemia elevated LFTs  obstructive disease  Past Surgical History: ovarian cyst, 1968 Appendectomy, incendental  1968 Cataract Surgery  Family History: Reviewed history from 09/11/2006 and no changes required. mother alive pernicious anemai (hx of anemia) father-decease 24 yo lung cancer/bone cancer Family History Osteoporosis hypothyroidism No FH of Colon Cancer:  Social History: Reviewed history from 09/11/2006 and no changes required. Retired  Charity fundraiser  Married  2 boys and 1 girl Former Smoker Alcohol Use - yes  2-3 per week Illicit Drug Use - no Drug Use:  no  Review of Systems  The patient denies allergy/sinus, anemia, anxiety-new, arthritis/joint pain, back pain, blood in urine, breast changes/lumps, change in vision, confusion, cough, coughing up blood, depression-new, fainting, fatigue, fever, headaches-new, hearing problems, heart murmur, heart rhythm changes, itching, menstrual pain, muscle pains/cramps,  night sweats, nosebleeds, pregnancy symptoms, shortness of breath, skin rash, sleeping problems,  sore throat, swelling of feet/legs, swollen lymph glands, thirst - excessive , urination - excessive , urination changes/pain, urine leakage, vision changes, and voice change.    Vital Signs:  Patient profile:   65 year old female Height:      64 inches Weight:      185.13 pounds BMI:     31.89 Pulse rate:   68 / minute Pulse rhythm:   regular BP sitting:   120 / 68  (left arm)  Vitals Entered By: Milford Cage NCMA (January 29, 2009 9:35 AM)  Physical Exam  General:  Well developed, well nourished, no acute distress.healthy appearing and obese.   Head:  Normocephalic and atraumatic. Eyes:  PERRLA, no icterus.exam deferred to patient's ophthalmologist.   Mouth:  No deformity or lesions, dentition normal. Neck:  Supple; no masses or thyromegaly. Lungs:  Clear throughout to auscultation. Heart:  Regular rate and rhythm; no murmurs, rubs,  or bruits. Extremities:  No clubbing, cyanosis, edema or deformities noted. Neurologic:  Alert and  oriented x4;  grossly normal neurologically. Cervical Nodes:  No significant cervical adenopathy. Inguinal Nodes:  No significant inguinal adenopathy. Psych:  Alert and cooperative. Normal mood and affect.   Impression & Recommendations:  Problem # 1:  ABDOMINAL PAIN (ICD-789.00) Assessment Unchanged I suspect her abdominal pain is musculoskeletal in nature. I have scheduled ultrasound and endoscopy to complete her workup and will also check screening lab tests. I could not appreciate organomegaly on exam and her symptoms certainly do not sound consistent with recurrent pancreatitis. We need to exclude chronic atrophic gastritis and pernicious anemia in her case. If so, there is increased incidence of intestinal metaplasia in gastric carcinoma.I have given her some p.r.n. Levsin to use as needed for abdominal spasms. 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 replacement as per primary care  Patient Instructions: 1)  Copy sent to : Dr. Birdie Sons 2)  Please continue current medications.  3)  Conscious Sedation brochure given.  4)  Upper Endoscopy brochure given. 5)  upper abdominal ultrasound exam 6)  Labs ordered.  7)  Pt will call back to schedule EGD and Ultrasound.  States she will need to look at her calender and call us back. 8)  Levsin Rx sent to pharmacy. 9)  The medication list was reviewed and reconciled.  All changed / newly prescribed medications were explained.  A complete medication list was provided to the patient / caregiver. Prescriptions: LEVSIN/SL 0.125 MG  SUBL (HYOSCYAMINE SULFATE) 1 sl q 4-6 hrs prn  #40 x 3   Entered by:   Ashok Cordia RN   Authorized by:   Mardella Layman MD [employer] [facility]   Signed by:   Ashok Cordia RN on 01/29/2009   Method used:   Electronically to        [facility] [address] [phone]* (retail)       [address]       [place], [place]  [address]  Ph: [phone] or [phone]       Fax: [phone]   RxID:   [id number]   Appended Document: CHRONIC ABD PAIN/YF Abdominal exam WNL on office visit.Marland KitchenMarland Kitchen  Appended Document: CHRONIC ABD PAIN/YF    Clinical Lists Changes  Orders: Added new Test order of EGD (EGD) -  Signed Added new Test order of Ultrasound Abdomen (UAS) - Signed

## 2010-02-15 NOTE — Letter (Signed)
Summary: Colonoscopy Date Change Letter  [facility]  [address] [place], [place] [address]   Phone: [phone]  Fax: [phone]      Jun 09, 2009 MRN: [id number]   [facility] [address] [place], [place]  [address]   Dear Ms. Mustard,   Previously you were recommended to have a repeat colonoscopy around this time. Your chart was recently reviewed by Dr. Sheryn Bison of [facility]. Follow up colonoscopy is now recommended in June 2016. This revised recommendation is based on current, nationally recognized guidelines for colorectal cancer screening and polyp surveillance. These guidelines are endorsed by the American Cancer Society, [employer] on Colorectal Cancer as well as numerous other major medical organizations.  Please understand that our recommendation assumes that you do not have any new symptoms such as bleeding, a change in bowel habits, anemia, or significant abdominal discomfort. If you do have any concerning GI symptoms or want to discuss the guideline recommendations, please call to arrange an office visit at your earliest convenience. Otherwise we will keep you in our reminder system and contact you 1-2 months prior to the date listed above to schedule your next colonoscopy.  Thank you,    Vania Rea. Jarold Motto, M.D.  [facility] [phone]

## 2010-02-15 NOTE — Letter (Signed)
Summary: Patient [facility] Biopsy Results   Gastroenterology  [address] [place], [place] [address]   Phone: [phone]  Fax: [phone]        February 12, 2009 MRN: [id number]    [facility] [address] [place], [place]  [address]    Dear Ms. Henrie,  I am pleased to inform you that the biopsies taken during your recent endoscopic examination did not show any evidence of cancer upon pathologic examination.  Additional information/recommendations:  __No further action is needed at this time.  Please follow-up with      your primary care physician for your other healthcare needs.  __ Please call [phone] to schedule a return visit to review      your condition.  xx__ Continue with the treatment plan as outlined on the day of your      exam.  __ You should have a repeat endoscopic examination for this problem              in _ months/years.   Please call us if you are having persistent problems or have questions about your condition that have not been fully answered at this time.  Sincerely,  Mardella Layman MD [employer]  This letter has been electronically signed by your physician.  Appended Document: Patient Notice-Endo Biopsy Results Letter mailed 1.31.11

## 2010-02-15 NOTE — Procedures (Signed)
Summary: Upper Endoscopy  Patient: Cassandra James Note: All result statuses are Final unless otherwise noted.  Tests: (1) Upper Endoscopy (EGD)   EGD Upper Endoscopy       DONE     [facility]     [address]     [place], [place]  [address]           ENDOSCOPY PROCEDURE REPORT           PATIENT:  Cassandra, James  MR#:  [id number]     BIRTHDATE:  1945/02/18, 63 yrs. old  GENDER:  female           ENDOSCOPIST:  Vania Rea. Jarold Motto, MD, [facility]     Referred by:           PROCEDURE DATE:  02/05/2009     PROCEDURE:  EGD with biopsy     ASA CLASS:  Class II     INDICATIONS:  abdominal pain, left upper quadr           MEDICATIONS:   Fentanyl 50 mcg IV, Versed 7 mg IV     TOPICAL ANESTHETIC:  Exactacain Spray           DESCRIPTION OF PROCEDURE:   After the risks benefits and     alternatives of the procedure were thoroughly explained, informed     consent was obtained.  The LB GIF-H180 [id number] endoscope was     introduced through the mouth and advanced to the second portion of     the duodenum, without limitations.  The instrument was slowly     withdrawn as the mucosa was fully examined.     <<PROCEDUREIMAGES>>           A hiatal hernia was found. 3cm prolapsing HH  Normal GE junction     was noted.  Normal duodenal folds were noted.  The stomach was     entered and closely examined. The antrum, angularis, and lesser     curvature were well visualized, including a retroflexed view of     the cardia and fundus. The stomach wall was normally distensable.     The scope passed easily through the pylorus into the duodenum. clo     bx. done    Retroflexed views revealed a hiatal hernia.    The     scope was then withdrawn from the patient and the procedure     completed.           COMPLICATIONS:  None           ENDOSCOPIC IMPRESSION:     1) Hiatal hernia     2) Normal GE junction     3) Normal duodenal folds     4) Normal stomach     5) A hiatal hernia  RECOMMENDATIONS:     1) continue current medications     ultrasound exam pending.           REPEAT EXAM:  No           ______________________________     Vania Rea. Jarold Motto, MD, [facility] Graham           CC:  Lindley Magnus, MD           n.     Rosalie DoctorVania Rea. Javonna Balli at 02/05/2009 03:50 PM           Marcello Fennel, [id number]  Note: An exclamation mark (!) indicates a result that was not dispersed  into the flowsheet. Document Creation Date: 02/05/2009 3:51 PM _______________________________________________________________________  (1) Order result status: Final Collection or observation date-time: 02/05/2009 15:43 Requested date-time:  Receipt date-time:  Reported date-time:  Referring Physician:   Ordering Physician: Sheryn Bison [phone]) Specimen Source:  Source: Launa Grill Order Number: [id number] Lab site:   Appended Document: Upper Endoscopy no biopsies done.Marland KitchenMarland Kitchen

## 2010-02-15 NOTE — Assessment & Plan Note (Signed)
Summary: Cardiology Nuclear Testing  Nuclear Med Background Indications for Stress Test: Evaluation for Ischemia   History: Myocardial Perfusion Study  History Comments: '07 MPS: EF=74%, NL  Symptoms: DOE, SOB    Nuclear Pre-Procedure Cardiac Risk Factors: History of Smoking, Hypertension, Lipids Caffeine/Decaff Intake: None NPO After: 10:30 PM Lungs: clear IV 0.9% NS with Angio Cath: 20g     IV Site: (R) AC IV Started by: Stanton Kidney EMT-P Chest Size (in) 40     Cup Size D     Height (in): 64 Weight (lb): 179 BMI: 30.84  Nuclear Med Study 1 or 2 day study:  1 day     Stress Test Type:  Stress Reading MD:  Charlton Haws, MD     Referring MD:  B.Swords Resting Radionuclide:  Technetium 64m Tetrofosmin     Resting Radionuclide Dose:  11.0 mCi  Stress Radionuclide:  Technetium 14m Tetrofosmin     Stress Radionuclide Dose:  33.0 mCi   Stress Protocol Exercise Time (min):  6:30 min     Max HR:  179 bpm     Predicted Max HR:  155 bpm  Max Systolic BP: 188 mm Hg     Percent Max HR:  115.48 %     METS: 7.20 Rate Pressure Product:  27253    Stress Test Technologist:  Milana Na EMT-P     Nuclear Technologist:  Burna Mortimer Deal RT-N  Rest Procedure  Myocardial perfusion imaging was performed at rest 45 minutes following the intravenous administration of Myoview Technetium 83m Tetrofosmin.  Stress Procedure  The patient exercised for 6:30. The patient stopped due to sob and denied any chest pain.  There were no significant ST-T wave changes, rare pvcs, and a short run of pat..  Myoview was injected at peak exercise and myocardial perfusion imaging was performed after a brief delay.  QPS Raw Data Images:  Normal; no motion artifact; normal heart/lung ratio. Stress Images:  NI: Uniform and normal uptake of tracer in all myocardial segments. Rest Images:  Normal homogeneous uptake in all areas of the myocardium. Subtraction (SDS):  Normal Transient Ischemic Dilatation:  .93   (Normal <1.22)  Lung/Heart Ratio:  .31  (Normal <0.45)  Quantitative Gated Spect Images QGS EDV:  61 ml QGS ESV:  16 ml QGS EF:  73 % QGS cine images:  normal  Findings Normal nuclear study      Overall Impression  Exercise Capacity: Fair exercise capacity. BP Response: Normal blood pressure response. Clinical Symptoms: Dyspnea ECG Impression: No significant ST segment change suggestive of ischemia. Overall Impression: Normal stress nuclear study.  Appended Document: Cardiology Nuclear Testing call patient. stress test normal  Appended Document: Cardiology Nuclear Testing Patient informed.

## 2010-02-18 NOTE — Consult Note (Signed)
Summary: [facility]   Imported By: Maryln Gottron 08/19/2009 12:49:54  _____________________________________________________________________  External Attachment:    Type:   Image     Comment:   External Document

## 2010-03-08 ENCOUNTER — Encounter: Payer: Self-pay | Admitting: Internal Medicine

## 2010-03-09 ENCOUNTER — Ambulatory Visit (INDEPENDENT_AMBULATORY_CARE_PROVIDER_SITE_OTHER): Payer: Medicare Other | Admitting: Internal Medicine

## 2010-03-09 ENCOUNTER — Encounter: Payer: Self-pay | Admitting: Internal Medicine

## 2010-03-09 DIAGNOSIS — I10 Essential (primary) hypertension: Secondary | ICD-10-CM

## 2010-03-09 DIAGNOSIS — D510 Vitamin B12 deficiency anemia due to intrinsic factor deficiency: Secondary | ICD-10-CM

## 2010-03-09 DIAGNOSIS — E785 Hyperlipidemia, unspecified: Secondary | ICD-10-CM

## 2010-03-09 DIAGNOSIS — R7309 Other abnormal glucose: Secondary | ICD-10-CM

## 2010-03-09 MED ORDER — CYANOCOBALAMIN 1000 MCG/ML IJ SOLN: 1000.0000 ug | INTRAMUSCULAR | Status: DC

## 2010-03-09 NOTE — Assessment & Plan Note (Signed)
Adequate control. Continue current medicaions.

## 2010-03-09 NOTE — Assessment & Plan Note (Signed)
Hemoglobin A1c is well controlled. Blood sugar moderately elevated. Discussed need for diet, exercise, weight loss.

## 2010-03-09 NOTE — Progress Notes (Signed)
  Subjective:    Patient ID: Marcello Fennel, female    DOB: 26-Nov-1945, 65 y.o.   MRN: [id number]  HPI   patient comes in for  followup of multiple medical problems. 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 In addition she has hyperlipidemia, hypertension and hyperglycemia. She does not monitor blood sugars at home. She does not monitor her blood pressure at home. She is tolerating medications without difficulty. She reports a 6 pound weight loss over the past year.  Past Medical History  Diagnosis Date  . Hyperlipidemia   . Hypertension   . Osteoporosis   . Colon polyps     history of  . Pernicious anemia   . Elevated LFTs     obstructive disease   Past Surgical History  Procedure Date  . Ovarian cyst surgery 1968  . Appendectomy 1968    incendental  . Cataract surgery     Bilateral  . Retinal detachment surgery     left    reports that she has quit smoking. She does not have any smokeless tobacco history on file. She reports that she drinks alcohol. She reports that she does not use illicit drugs. family history includes Cancer in her father; Hypothyroidism in her father; Osteoporosis in her father; and Pernicious anemia in her mother.    Allergies  Allergen Reactions  . Cephalexin     REACTION: anaphylacic shock     Review of Systems  patient denies chest pain, shortness of breath, orthopnea. Denies lower extremity edema, abdominal pain, change in appetite, change in bowel movements. Patient denies rashes, musculoskeletal complaints. No other specific complaints in a complete review of systems.      Objective:   Physical Exam  Well-developed well-nourished female in no acute distress. HEENT exam atraumatic, normocephalic, extraocular muscles are intact. Neck is supple. No jugular venous distention no thyromegaly. Chest clear to auscultation without increased work of breathing. Cardiac exam S1 and S2 are regular. Abdominal  exam active bowel sounds, soft, nontender. Extremities no edema. Neurologic exam she is alert without any motor sensory deficits. Gait is normal.        Assessment & Plan:

## 2010-03-09 NOTE — Assessment & Plan Note (Signed)
Adequate control. Continue current medications. 

## 2010-03-09 NOTE — Assessment & Plan Note (Signed)
Home injections of B12 every month.

## 2010-09-07 ENCOUNTER — Other Ambulatory Visit: Payer: Self-pay | Admitting: Internal Medicine

## 2010-09-07 ENCOUNTER — Encounter: Payer: Self-pay | Admitting: Internal Medicine

## 2010-09-07 ENCOUNTER — Ambulatory Visit (INDEPENDENT_AMBULATORY_CARE_PROVIDER_SITE_OTHER): Payer: Medicare Other | Admitting: Internal Medicine

## 2010-09-07 DIAGNOSIS — E785 Hyperlipidemia, unspecified: Secondary | ICD-10-CM

## 2010-09-07 DIAGNOSIS — K449 Diaphragmatic hernia without obstruction or gangrene: Secondary | ICD-10-CM

## 2010-09-07 DIAGNOSIS — R7309 Other abnormal glucose: Secondary | ICD-10-CM

## 2010-09-07 DIAGNOSIS — K7689 Other specified diseases of liver: Secondary | ICD-10-CM

## 2010-09-07 DIAGNOSIS — I10 Essential (primary) hypertension: Secondary | ICD-10-CM

## 2010-09-07 LAB — BASIC METABOLIC PANEL
BUN: 18 mg/dL (ref 6–23)
Calcium: 9.2 mg/dL (ref 8.4–10.5)
Creatinine, Ser: 0.6 mg/dL (ref 0.4–1.2)
GFR: 98.36 mL/min (ref >60.00)
Glucose, Bld: 110 mg/dL — ABNORMAL HIGH (ref 70–99)

## 2010-09-07 LAB — CBC WITH DIFFERENTIAL/PLATELET
Basophils Absolute: 0 10*3/uL (ref 0.0–0.1)
Basophils Relative: 0.6 % (ref 0.0–3.0)
Eosinophils Absolute: 0.2 10*3/uL (ref 0.0–0.7)
Lymphocytes Relative: 26.2 % (ref 12.0–46.0)
MCHC: 33 g/dL (ref 30.0–36.0)
Neutrophils Relative %: 64 % (ref 43.0–77.0)
RBC: 5.11 Mil/uL (ref 3.87–5.11)
RDW: 13.4 % (ref 11.5–14.6)

## 2010-09-07 LAB — HEMOGLOBIN A1C: Hgb A1c MFr Bld: 6.3 % (ref 4.6–6.5)

## 2010-09-07 LAB — HEPATIC FUNCTION PANEL
AST: 19 U/L (ref 0–37)
Albumin: 4.3 g/dL (ref 3.5–5.2)

## 2010-09-07 LAB — LIPID PANEL
HDL: 46.4 mg/dL (ref >39.00)
Triglycerides: 177 mg/dL — ABNORMAL HIGH (ref 0.0–149.0)

## 2010-09-07 MED ORDER — ROSUVASTATIN CALCIUM 40 MG PO TABS: ORAL_TABLET | ORAL | Status: DC

## 2010-09-07 MED ORDER — LOSARTAN POTASSIUM-HCTZ 100-25 MG PO TABS: 1.0000 | ORAL_TABLET | Freq: Every day | ORAL | Status: DC

## 2010-09-08 NOTE — Progress Notes (Signed)
Quick Note:  Call patient labs are ok ______

## 2010-09-15 NOTE — Progress Notes (Signed)
Subjective:    Cassandra James is a 65 y.o. female who presents for Medicare Initial preventive examination.  Preventive Screening-Counseling & Management  Tobacco History  Smoking status  . Former Smoker  . Quit date: 01/17/2000  Smokeless tobacco  . Not on file     Problems Prior to Visit 1.   Current Problems (verified) Patient Active Problem List  Diagnoses  . HYPERLIPIDEMIA--tolerating meds  .   .   . HYPERTENSION---tolerating meds  .   .   .   . HYPERGLYCEMIA---needs f/u  . COLONIC POLYPS, HX OF    Medications Prior to Visit Current Outpatient Prescriptions on File Prior to Visit  Medication Sig Dispense Refill  . aspirin 81 MG tablet Take by mouth. 1/2 tab qod       . Calcium Carbonate-Vitamin D (CALTRATE 600+D) 600-400 MG-UNIT per tablet Take 1 tablet by mouth daily.        . Cholecalciferol (VITAMIN D) 1000 UNITS capsule Take 1,000 Units by mouth daily.        . cyanocobalamin (,VITAMIN B-12,) 1000 MCG/ML injection Inject 1 mL (1,000 mcg total) into the muscle every 30 (thirty) days.  10 mL  3  . hyoscyamine (LEVSIN) 0.125 MG/ML solution Take by mouth every 4 (four) hours as needed. 1 sl q 4-6 hrs prn         Current Medications (verified) Current Outpatient Prescriptions  Medication Sig Dispense Refill  . aspirin 81 MG tablet Take by mouth. 1/2 tab qod       . Calcium Carbonate-Vitamin D (CALTRATE 600+D) 600-400 MG-UNIT per tablet Take 1 tablet by mouth daily.        . Cholecalciferol (VITAMIN D) 1000 UNITS capsule Take 1,000 Units by mouth daily.        . cyanocobalamin (,VITAMIN B-12,) 1000 MCG/ML injection Inject 1 mL (1,000 mcg total) into the muscle every 30 (thirty) days.  10 mL  3  . hyoscyamine (LEVSIN) 0.125 MG/ML solution Take by mouth every 4 (four) hours as needed. 1 sl q 4-6 hrs prn       . rosuvastatin (CRESTOR) 40 MG tablet 1 daily or as directed  90 tablet  3  . CRESTOR 20 MG tablet TAKE 1 TABLET BY MOUTH EVERY DAY OR AS DIRECTED  90  tablet  0  . losartan-hydrochlorothiazide (HYZAAR) 100-25 MG per tablet Take 1 tablet by mouth daily.  90 tablet  3     Allergies (verified) Cephalexin   PAST HISTORY  Family History Family History  Problem Relation Age of Onset  . Pernicious anemia Mother   . Cancer Father     lung/ bone  . Osteoporosis Father   . Hypothyroidism Father     Social History History  Substance Use Topics  . Smoking status: Former Smoker    Quit date: 01/17/2000  . Smokeless tobacco: Not on file  . Alcohol Use: 0.0 oz/week    2-3 drink(s) per week     Are there smokers in your home (other than you)? No  Risk Factors Current exercise habits: The patient does not participate in regular exercise at present.  Dietary issues discussed: does not follow a diet   Cardiac risk factors: advanced age (older than 18 for men, 67 for women).  Depression Screen (Note: if answer to either of the following is "Yes", a more complete depression screening is indicated)   Over the past 2 weeks, have you felt down, depressed or hopeless? No  Activities of Daily  Living In your present state of health, do you have any difficulty performing the following activities?:  Driving? No Managing money?  No Feeding yourself? No Getting from bed to chair? No Climbing a flight of stairs? No Preparing food and eating?: No In the past year have you fallen or had a near fall?:No  Hearing Difficulties: No   Do you feel that you have a problem with memory? No Cognitive Testing  Alert? Yes  Normal Appearance?Yes  Oriented to person? Yes  Place Displays appropriate judgment?Yes  Immunization History  Administered Date(s) Administered  . Influenza Whole 10/26/2006, 10/30/2007  . Pneumococcal Polysaccharide 01/28/2009  . Td 05/04/2008  . Zoster 05/24/2007    Screening Tests Health Maintenance  Topic Date Due  . Influenza Vaccine  10/17/2010  . Mammogram  01/17/2012  . Colonoscopy  01/16/2014  . Tetanus/tdap   05/05/2018  . Pneumococcal Polysaccharide Vaccine Age 17 And Over  Completed  . Zostavax  Completed    All answers were reviewed with the patient and necessary referrals were made:  Judie Petit, MD   09/15/2010   History reviewed: allergies, current medications, past family history, past medical history, past social history, past surgical history and problem list  Review of Systems  patient denies chest pain, shortness of breath, orthopnea. Denies lower extremity edema, abdominal pain, change in appetite, change in bowel movements. Patient denies rashes, musculoskeletal complaints. No other specific complaints in a complete review of systems.       Objective:   Well-developed well-nourished female in no acute distress. HEENT exam atraumatic, normocephalic, extraocular muscles are intact. Neck is supple. No jugular venous distention no thyromegaly. Chest clear to auscultation without increased work of breathing. Cardiac exam S1 and S2 are regular. Abdominal exam active bowel sounds, soft, nontender. Extremities no edema. Neurologic exam she is alert without any motor sensory deficits. Gait is normal.   Body mass index is 30.04 kg/(m^2). BP 138/84  Pulse 64  Temp 98.2 F (36.8 C)  Resp 16  Ht 5\' 4"  (1.626 m)  Wt 175 lb (79.379 kg)  BMI 30.04 kg/m2   Assessment:     Well visit     Plan:     During the course of the visit the patient was educated and counseled about appropriate screening and preventive services including:      Diet review for nutrition referral? Yes ____  Not Indicated __x__   Patient Instructions (the written plan) was given to the patient.  Medicare Attestation I have personally reviewed: The patient's medical and social history Their use of alcohol, tobacco or illicit drugs Their current medications and supplements The patient's functional ability including ADLs,fall risks, home safety risks, cognitive, and hearing and visual impairment Diet  and physical activities Evidence for depression or mood disorders  The patient's weight, height, BMI, and visual acuity have been recorded in the chart.  I have made referrals, counseling, and provided education to the patient based on review of the above and I have provided the patient with a written personalized care plan for preventive services.     Judie Petit, MD   09/15/2010

## 2010-12-02 ENCOUNTER — Other Ambulatory Visit: Payer: Self-pay | Admitting: Internal Medicine

## 2011-02-09 DIAGNOSIS — Z124 Encounter for screening for malignant neoplasm of cervix: Secondary | ICD-10-CM | POA: Diagnosis not present

## 2011-02-09 DIAGNOSIS — Z1231 Encounter for screening mammogram for malignant neoplasm of breast: Secondary | ICD-10-CM | POA: Diagnosis not present

## 2011-03-05 DIAGNOSIS — R062 Wheezing: Secondary | ICD-10-CM | POA: Diagnosis not present

## 2011-03-05 DIAGNOSIS — J40 Bronchitis, not specified as acute or chronic: Secondary | ICD-10-CM | POA: Diagnosis not present

## 2011-03-11 ENCOUNTER — Other Ambulatory Visit: Payer: Self-pay | Admitting: Internal Medicine

## 2011-05-17 ENCOUNTER — Ambulatory Visit (INDEPENDENT_AMBULATORY_CARE_PROVIDER_SITE_OTHER): Payer: Medicare Other | Admitting: Internal Medicine

## 2011-05-17 ENCOUNTER — Encounter: Payer: Self-pay | Admitting: Internal Medicine

## 2011-05-17 VITALS — BP 124/82 | HR 80 | Temp 97.9°F | Wt 181.0 lb

## 2011-05-17 DIAGNOSIS — E785 Hyperlipidemia, unspecified: Secondary | ICD-10-CM | POA: Diagnosis not present

## 2011-05-17 DIAGNOSIS — R7309 Other abnormal glucose: Secondary | ICD-10-CM

## 2011-05-17 DIAGNOSIS — I10 Essential (primary) hypertension: Secondary | ICD-10-CM

## 2011-05-17 DIAGNOSIS — IMO0001 Reserved for inherently not codable concepts without codable children: Secondary | ICD-10-CM

## 2011-05-17 DIAGNOSIS — D510 Vitamin B12 deficiency anemia due to intrinsic factor deficiency: Secondary | ICD-10-CM

## 2011-05-17 LAB — HEPATIC FUNCTION PANEL
ALT: 22 U/L (ref 0–35)
Albumin: 4.2 g/dL (ref 3.5–5.2)
Total Protein: 6.9 g/dL (ref 6.0–8.3)

## 2011-05-17 LAB — HEMOGLOBIN A1C: Hgb A1c MFr Bld: 6.2 % (ref 4.6–6.5)

## 2011-05-17 LAB — LIPID PANEL
Cholesterol: 134 mg/dL (ref 0–200)
HDL: 45 mg/dL (ref >39.00)
VLDL: 39.2 mg/dL (ref 0.0–40.0)

## 2011-05-17 NOTE — Assessment & Plan Note (Signed)
Check labs today.

## 2011-05-17 NOTE — Assessment & Plan Note (Signed)
Well controlled Continue meds 

## 2011-05-17 NOTE — Progress Notes (Signed)
Patient ID: Cassandra James, female   DOB: Nov 02, 1945, 66 y.o.   MRN: [id number]  htn--tolerating meds  Lipids- tolerating meds  Anemia--- needs f/u  Past Medical History  Diagnosis Date  . Hyperlipidemia   . Hypertension   . Osteoporosis   . Colon polyps     history of  . Pernicious anemia   . Elevated LFTs     obstructive disease    History   Social History  . Marital Status: Married    Spouse Name: N/A    Number of Children: N/A  . Years of Education: N/A   Occupational History  . Not on file.   Social History Main Topics  . Smoking status: Former Smoker    Quit date: 01/17/2000  . Smokeless tobacco: Not on file  . Alcohol Use: 0.0 oz/week    2-3 drink(s) per week  . Drug Use: No  . Sexually Active: Not on file   Other Topics Concern  . Not on file   Social History Narrative  . No narrative on file    Past Surgical History  Procedure Date  . Ovarian cyst surgery 1968  . Appendectomy 1968    incendental  . Cataract surgery     Bilateral  . Retinal detachment surgery     left    Family History  Problem Relation Age of Onset  . Pernicious anemia Mother   . Cancer Father     lung/ bone  . Osteoporosis Father   . Hypothyroidism Father     Allergies  Allergen Reactions  . Cephalexin     REACTION: anaphylacic shock    Current Outpatient Prescriptions on File Prior to Visit  Medication Sig Dispense Refill  . aspirin 81 MG tablet Take by mouth. 1/2 tab qod       . Calcium Carbonate-Vitamin D (CALTRATE 600+D) 600-400 MG-UNIT per tablet Take 1 tablet by mouth. Every other week      . Cholecalciferol (VITAMIN D) 1000 UNITS capsule Take 1,000 Units by mouth daily.       . cyanocobalamin (,VITAMIN B-12,) 1000 MCG/ML injection INJECT 1 ML INTO THE MUSCLE EVERY 30 DAYS  10 mL  2  . hyoscyamine (CYSTOSPAZ) 0.15 MG tablet Take 0.15 mg by mouth every 6 (six) months.      . hyoscyamine (LEVSIN) 0.125 MG/ML solution Take by mouth every 4 (four) hours as  needed. 1 sl q 4-6 hrs prn       . losartan-hydrochlorothiazide (HYZAAR) 100-25 MG per tablet Take 1 tablet by mouth daily.  90 tablet  3  . PROAIR HFA 108 (90 BASE) MCG/ACT inhaler Inhale 2 puffs into the lungs as needed.      Marland Kitchen DISCONTD: rosuvastatin (CRESTOR) 40 MG tablet 1 daily or as directed  90 tablet  3  . DISCONTD: CRESTOR 20 MG tablet TAKE 1 TABLET BY MOUTH EVERY DAY OR AS DIRECTED  90 tablet  0     patient denies chest pain, shortness of breath, orthopnea. Denies lower extremity edema, abdominal pain, change in appetite, change in bowel movements. Patient denies rashes, musculoskeletal complaints. No other specific complaints in a complete review of systems.   BP 124/82  Pulse 80  Temp(Src) 97.9 F (36.6 C) (Oral)  Wt 181 lb (82.101 kg)  Well-developed well-nourished female in no acute distress. HEENT exam atraumatic, normocephalic, extraocular muscles are intact. Neck is supple. No jugular venous distention no thyromegaly. Chest clear to auscultation without increased work of breathing. Cardiac exam S1  and S2 are regular. Abdominal exam active bowel sounds, soft, nontender. Extremities no edema.

## 2011-05-17 NOTE — Assessment & Plan Note (Signed)
Tolerating meds Check labs today 

## 2011-06-03 ENCOUNTER — Other Ambulatory Visit: Payer: Self-pay | Admitting: Internal Medicine

## 2011-06-08 ENCOUNTER — Telehealth: Payer: Self-pay | Admitting: Internal Medicine

## 2011-06-08 MED ORDER — ROSUVASTATIN CALCIUM 40 MG PO TABS: 40.0000 mg | ORAL_TABLET | Freq: Every day | ORAL | Status: DC

## 2011-06-08 NOTE — Telephone Encounter (Signed)
Pt needed a refill on her crestor.  Sent in electronically to [facility].  Pt aware

## 2011-06-08 NOTE — Telephone Encounter (Signed)
Left message for pt to call back  °

## 2011-06-08 NOTE — Telephone Encounter (Signed)
Pt would like Cassandra James to return her call concerning chole and crestor

## 2011-06-15 ENCOUNTER — Other Ambulatory Visit: Payer: Self-pay | Admitting: *Deleted

## 2011-06-15 MED ORDER — ROSUVASTATIN CALCIUM 20 MG PO TABS: 20.0000 mg | ORAL_TABLET | Freq: Every day | ORAL | Status: DC

## 2011-06-16 ENCOUNTER — Other Ambulatory Visit: Payer: Self-pay | Admitting: *Deleted

## 2011-07-13 DIAGNOSIS — L570 Actinic keratosis: Secondary | ICD-10-CM | POA: Diagnosis not present

## 2011-07-13 DIAGNOSIS — L821 Other seborrheic keratosis: Secondary | ICD-10-CM | POA: Diagnosis not present

## 2011-07-14 DIAGNOSIS — H43399 Other vitreous opacities, unspecified eye: Secondary | ICD-10-CM | POA: Diagnosis not present

## 2011-07-14 DIAGNOSIS — H5200 Hypermetropia, unspecified eye: Secondary | ICD-10-CM | POA: Diagnosis not present

## 2011-07-14 DIAGNOSIS — H52229 Regular astigmatism, unspecified eye: Secondary | ICD-10-CM | POA: Diagnosis not present

## 2011-07-14 DIAGNOSIS — H43819 Vitreous degeneration, unspecified eye: Secondary | ICD-10-CM | POA: Diagnosis not present

## 2011-10-19 DIAGNOSIS — Z23 Encounter for immunization: Secondary | ICD-10-CM | POA: Diagnosis not present

## 2011-11-17 ENCOUNTER — Ambulatory Visit: Payer: Medicare Other | Admitting: Internal Medicine

## 2011-11-20 ENCOUNTER — Ambulatory Visit (INDEPENDENT_AMBULATORY_CARE_PROVIDER_SITE_OTHER): Payer: Medicare Other | Admitting: Internal Medicine

## 2011-11-20 ENCOUNTER — Encounter: Payer: Self-pay | Admitting: Internal Medicine

## 2011-11-20 VITALS — BP 124/78 | HR 54 | Temp 98.1°F | Wt 176.0 lb

## 2011-11-20 DIAGNOSIS — E8881 Metabolic syndrome: Secondary | ICD-10-CM

## 2011-11-20 DIAGNOSIS — E785 Hyperlipidemia, unspecified: Secondary | ICD-10-CM | POA: Diagnosis not present

## 2011-11-20 DIAGNOSIS — R7309 Other abnormal glucose: Secondary | ICD-10-CM | POA: Diagnosis not present

## 2011-11-20 DIAGNOSIS — R739 Hyperglycemia, unspecified: Secondary | ICD-10-CM

## 2011-11-20 DIAGNOSIS — I10 Essential (primary) hypertension: Secondary | ICD-10-CM | POA: Diagnosis not present

## 2011-11-20 LAB — BASIC METABOLIC PANEL
BUN: 15 mg/dL (ref 6–23)
Calcium: 8.8 mg/dL (ref 8.4–10.5)
GFR: 112.02 mL/min (ref >60.00)
Glucose, Bld: 112 mg/dL — ABNORMAL HIGH (ref 70–99)

## 2011-11-20 NOTE — Assessment & Plan Note (Signed)
Check labs today.

## 2011-11-20 NOTE — Assessment & Plan Note (Signed)
Fair control Continue same meds 

## 2011-11-20 NOTE — Assessment & Plan Note (Signed)
Previously well controlled °

## 2011-11-20 NOTE — Progress Notes (Signed)
Patient ID: Cassandra James, female   DOB: [birth year], 66 y.o.   MRN: [id number] htn-- home bps fluctuate 110/70-142/88. She is a Engineer, civil (consulting) and checks at work.  Lipids-- tolerating crestor  Anemia-b12 at home  Past Medical History  Diagnosis Date  . Hyperlipidemia   . Hypertension   . Osteoporosis   . Colon polyps     history of  . Pernicious anemia   . Elevated LFTs     obstructive disease    History   Social History  . Marital Status: Married    Spouse Name: N/A    Number of Children: N/A  . Years of Education: N/A   Occupational History  . Not on file.   Social History Main Topics  . Smoking status: Former Smoker    Quit date: 01/17/2000  . Smokeless tobacco: Not on file  . Alcohol Use: 0.0 oz/week    2-3 drink(s) per week  . Drug Use: No  . Sexually Active: Not on file   Other Topics Concern  . Not on file   Social History Narrative  . No narrative on file    Past Surgical History  Procedure Date  . Ovarian cyst surgery 1968  . Appendectomy 1968    incendental  . Cataract surgery     Bilateral  . Retinal detachment surgery     left    Family History  Problem Relation Age of Onset  . Pernicious anemia Mother   . Cancer Father     lung/ bone  . Osteoporosis Father   . Hypothyroidism Father     Allergies  Allergen Reactions  . Cephalexin     REACTION: anaphylacic shock    Current Outpatient Prescriptions on File Prior to Visit  Medication Sig Dispense Refill  . aspirin 81 MG tablet Take by mouth. 1/2 tab qod       . Calcium Carbonate-Vitamin D (CALTRATE 600+D) 600-400 MG-UNIT per tablet Take 1 tablet by mouth. Every other week      . Cholecalciferol (VITAMIN D) 1000 UNITS capsule Take 1,000 Units by mouth daily.       . cyanocobalamin (,VITAMIN B-12,) 1000 MCG/ML injection INJECT 1 ML INTO THE MUSCLE EVERY 30 DAYS  10 mL  2  . hyoscyamine (LEVSIN) 0.125 MG/ML solution Take by mouth every 4 (four) hours as needed. 1 sl q 4-6 hrs prn       .  Multiple Vitamin (MULTIVITAMIN) tablet Take 1 tablet by mouth daily.      Marland Kitchen PROAIR HFA 108 (90 BASE) MCG/ACT inhaler Inhale 2 puffs into the lungs as needed.      . rosuvastatin (CRESTOR) 20 MG tablet Take 1 tablet (20 mg total) by mouth daily.  30 tablet  5  . [DISCONTINUED] losartan-hydrochlorothiazide (HYZAAR) 100-25 MG per tablet Take 1 tablet by mouth daily.  90 tablet  3     patient denies chest pain, shortness of breath, orthopnea. Denies lower extremity edema, abdominal pain, change in appetite, change in bowel movements. Patient denies rashes, musculoskeletal complaints. No other specific complaints in a complete review of systems.   BP 124/78  Pulse 54  Temp 98.1 F (36.7 C) (Oral)  Wt 176 lb (79.833 kg)  Well-developed well-nourished female in no acute distress. HEENT exam atraumatic, normocephalic, extraocular muscles are intact. Neck is supple. No jugular venous distention no thyromegaly. Chest clear to auscultation without increased work of breathing. Cardiac exam S1 and S2 are regular. Abdominal exam active bowel sounds, soft, nontender. Extremities  no edema. Neurologic exam she is alert without any motor sensory deficits. Gait is normal.

## 2011-11-23 ENCOUNTER — Other Ambulatory Visit: Payer: Self-pay | Admitting: *Deleted

## 2011-11-23 MED ORDER — POTASSIUM CHLORIDE CRYS ER 20 MEQ PO TBCR: 20.0000 meq | EXTENDED_RELEASE_TABLET | Freq: Every day | ORAL | Status: DC

## 2011-12-07 ENCOUNTER — Other Ambulatory Visit: Payer: Self-pay | Admitting: Internal Medicine

## 2012-02-17 LAB — HM MAMMOGRAPHY

## 2012-02-20 ENCOUNTER — Other Ambulatory Visit: Payer: Self-pay | Admitting: Internal Medicine

## 2012-02-23 DIAGNOSIS — Z124 Encounter for screening for malignant neoplasm of cervix: Secondary | ICD-10-CM | POA: Diagnosis not present

## 2012-02-23 DIAGNOSIS — Z1231 Encounter for screening mammogram for malignant neoplasm of breast: Secondary | ICD-10-CM | POA: Diagnosis not present

## 2012-05-10 ENCOUNTER — Other Ambulatory Visit: Payer: Self-pay | Admitting: Internal Medicine

## 2012-05-13 ENCOUNTER — Other Ambulatory Visit: Payer: Medicare Other

## 2012-05-20 ENCOUNTER — Ambulatory Visit: Payer: Medicare Other | Admitting: Internal Medicine

## 2012-06-03 ENCOUNTER — Other Ambulatory Visit: Payer: Medicare Other

## 2012-06-05 ENCOUNTER — Ambulatory Visit: Payer: Medicare Other | Admitting: Internal Medicine

## 2012-06-07 ENCOUNTER — Ambulatory Visit: Payer: Medicare Other | Admitting: Internal Medicine

## 2012-06-13 ENCOUNTER — Ambulatory Visit (INDEPENDENT_AMBULATORY_CARE_PROVIDER_SITE_OTHER): Payer: Medicare Other | Admitting: Family Medicine

## 2012-06-13 ENCOUNTER — Encounter: Payer: Self-pay | Admitting: Family Medicine

## 2012-06-13 VITALS — BP 130/78 | HR 78 | Temp 98.3°F | Wt 178.0 lb

## 2012-06-13 DIAGNOSIS — J209 Acute bronchitis, unspecified: Secondary | ICD-10-CM

## 2012-06-13 MED ORDER — HYDROCODONE-HOMATROPINE 5-1.5 MG/5ML PO SYRP: 5.0000 mL | ORAL_SOLUTION | Freq: Three times a day (TID) | ORAL | Status: DC | PRN

## 2012-06-13 MED ORDER — ALBUTEROL SULFATE HFA 108 (90 BASE) MCG/ACT IN AERS: 2.0000 | INHALATION_SPRAY | Freq: Four times a day (QID) | RESPIRATORY_TRACT | Status: DC | PRN

## 2012-06-13 MED ORDER — PREDNISONE 20 MG PO TABS: 40.0000 mg | ORAL_TABLET | Freq: Every day | ORAL | Status: DC

## 2012-06-13 MED ORDER — AZITHROMYCIN 250 MG PO TABS: ORAL_TABLET | ORAL | Status: DC

## 2012-06-13 NOTE — Patient Instructions (Signed)
-  As we discussed, we have prescribe new medications for you at this appointment. We discussed the common and serious potential adverse effects of this medication and you can review these and more with the pharmacist when you pick up your medication.  Please follow the instructions for use carefully and notify us immediately if you have any problems taking this medication.

## 2012-06-13 NOTE — Progress Notes (Signed)
Chief Complaint  Patient presents with  . Cough    congestion, tightness in chest, hoarseness x 10 days     HPI:  Acute visit for cough and congestion: -started: about 10 days ago -symptoms:nasal congestion, sore throat, cough - is terrible, hoarseness. Mild SOB -denies:fever, NVD, tooth pain, strep or mono exposure -has tried: musinex, sudafed, coricidin, robitussin -sick contacts: none known -Hx of: allergies, no chronic lung problems   ROS: See pertinent positives and negatives per HPI.  Past Medical History  Diagnosis Date  . Hyperlipidemia   . Hypertension   . Osteoporosis   . Colon polyps     history of  . Pernicious anemia   . Elevated LFTs     obstructive disease    Family History  Problem Relation Age of Onset  . Pernicious anemia Mother   . Cancer Father     lung/ bone  . Osteoporosis Father   . Hypothyroidism Father     History   Social History  . Marital Status: Married    Spouse Name: N/A    Number of Children: N/A  . Years of Education: N/A   Social History Main Topics  . Smoking status: Former Smoker    Quit date: 01/17/2000  . Smokeless tobacco: None  . Alcohol Use: 0.0 oz/week    2-3 drink(s) per week  . Drug Use: No  . Sexually Active: None   Other Topics Concern  . None   Social History Narrative  . None    Current outpatient prescriptions:albuterol (PROVENTIL HFA;VENTOLIN HFA) 108 (90 BASE) MCG/ACT inhaler, Inhale 2 puffs into the lungs every 6 (six) hours as needed for wheezing., Disp: 1 Inhaler, Rfl: 2;  aspirin 81 MG tablet, Take by mouth. 1/2 tab qod , Disp: , Rfl: ;  azithromycin (ZITHROMAX) 250 MG tablet, 2 tabs on first day, then one daily for 4 days, Disp: 6 tablet, Rfl: 0 Calcium Carbonate-Vitamin D (CALTRATE 600+D) 600-400 MG-UNIT per tablet, Take 1 tablet by mouth. Every other week, Disp: , Rfl: ;  Cholecalciferol (VITAMIN D) 1000 UNITS capsule, Take 1,000 Units by mouth daily. , Disp: , Rfl: ;  cyanocobalamin (,VITAMIN  B-12,) 1000 MCG/ML injection, INJECT 1 ML INTO THE MUSCLE EVERY 30 DAYS, Disp: 10 mL, Rfl: 0 HYDROcodone-homatropine (HYCODAN) 5-1.5 MG/5ML syrup, Take 5 mLs by mouth every 8 (eight) hours as needed for cough., Disp: 120 mL, Rfl: 0;  hyoscyamine (LEVSIN SL) 0.125 MG SL tablet, TAKE 1 TABLET UNDER THE TONGUE AS DIRECTED, Disp: 40 tablet, Rfl: 0;  hyoscyamine (LEVSIN) 0.125 MG/ML solution, Take by mouth every 4 (four) hours as needed. 1 sl q 4-6 hrs prn , Disp: , Rfl:  losartan-hydrochlorothiazide (HYZAAR) 100-25 MG per tablet, TAKE 1 TABLET BY MOUTH EVERY DAY, Disp: 90 tablet, Rfl: 0;  Multiple Vitamin (MULTIVITAMIN) tablet, Take 1 tablet by mouth daily., Disp: , Rfl: ;  potassium chloride SA (K-DUR,KLOR-CON) 20 MEQ tablet, Take 1 tablet (20 mEq total) by mouth daily., Disp: 90 tablet, Rfl: 3;  predniSONE (DELTASONE) 20 MG tablet, Take 2 tablets (40 mg total) by mouth daily., Disp: 10 tablet, Rfl: 0 rosuvastatin (CRESTOR) 20 MG tablet, Take 1 tablet (20 mg total) by mouth daily., Disp: 30 tablet, Rfl: 5  EXAM:  Filed Vitals:   06/13/12 0958  BP: 130/78  Pulse: 78  Temp: 98.3 F (36.8 C)  O2 94% on RA  Body mass index is 30.54 kg/(m^2).  GENERAL: vitals reviewed and listed above, alert, oriented, appears well hydrated and  in no acute distress  HEENT: atraumatic, conjunttiva clear, no obvious abnormalities on inspection of external nose and ears, normal appearance of ear canals and TMs, clear nasal congestion, mild post oropharyngeal erythema with PND, no tonsillar edema or exudate, no sinus TTP  NECK: no obvious masses on inspection  LUNGS: few scattered exp wheezes  CV: HRRR, no peripheral edema  MS: moves all extremities without noticeable abnormality  PSYCH: pleasant and cooperative, no obvious depression or anxiety  ASSESSMENT AND PLAN:  Discussed the following assessment and plan:  Acute bronchitis - Plan: azithromycin (ZITHROMAX) 250 MG tablet, predniSONE (DELTASONE) 20 MG  tablet, albuterol (PROVENTIL HFA;VENTOLIN HFA) 108 (90 BASE) MCG/ACT inhaler, HYDROcodone-homatropine (HYCODAN) 5-1.5 MG/5ML syrup -likely viral, but with worsening symptoms after ten days will do zpak too - prednisone, alb and cough medication. Risks of meds and return precautions discussed. -Patient advised to return or notify a doctor immediately if symptoms worsen or persist or new concerns arise.  There are no Patient Instructions on file for this visit.   Kriste Basque R.

## 2012-06-18 ENCOUNTER — Other Ambulatory Visit: Payer: Medicare Other

## 2012-06-19 ENCOUNTER — Ambulatory Visit: Payer: Medicare Other | Admitting: Internal Medicine

## 2012-06-24 ENCOUNTER — Ambulatory Visit: Payer: Medicare Other | Admitting: Internal Medicine

## 2012-08-07 ENCOUNTER — Other Ambulatory Visit (INDEPENDENT_AMBULATORY_CARE_PROVIDER_SITE_OTHER): Payer: Medicare Other

## 2012-08-07 DIAGNOSIS — E876 Hypokalemia: Secondary | ICD-10-CM

## 2012-08-07 DIAGNOSIS — R7309 Other abnormal glucose: Secondary | ICD-10-CM

## 2012-08-07 DIAGNOSIS — R739 Hyperglycemia, unspecified: Secondary | ICD-10-CM

## 2012-08-07 LAB — BASIC METABOLIC PANEL
CO2: 31 mEq/L (ref 19–32)
Chloride: 103 mEq/L (ref 96–112)
Creatinine, Ser: 0.7 mg/dL (ref 0.4–1.2)
Potassium: 4.1 mEq/L (ref 3.5–5.1)

## 2012-08-07 LAB — HEMOGLOBIN A1C: Hgb A1c MFr Bld: 6.4 % (ref 4.6–6.5)

## 2012-08-14 ENCOUNTER — Ambulatory Visit: Payer: Medicare Other | Admitting: Internal Medicine

## 2012-08-14 ENCOUNTER — Ambulatory Visit (INDEPENDENT_AMBULATORY_CARE_PROVIDER_SITE_OTHER): Payer: Medicare Other | Admitting: Internal Medicine

## 2012-08-14 ENCOUNTER — Encounter: Payer: Self-pay | Admitting: Internal Medicine

## 2012-08-14 VITALS — BP 134/82 | HR 64 | Temp 98.1°F | Wt 173.0 lb

## 2012-08-14 DIAGNOSIS — E785 Hyperlipidemia, unspecified: Secondary | ICD-10-CM | POA: Diagnosis not present

## 2012-08-14 DIAGNOSIS — R7309 Other abnormal glucose: Secondary | ICD-10-CM | POA: Diagnosis not present

## 2012-08-14 DIAGNOSIS — I10 Essential (primary) hypertension: Secondary | ICD-10-CM | POA: Diagnosis not present

## 2012-08-14 DIAGNOSIS — R739 Hyperglycemia, unspecified: Secondary | ICD-10-CM

## 2012-08-14 MED ORDER — VALSARTAN-HYDROCHLOROTHIAZIDE 320-25 MG PO TABS: 1.0000 | ORAL_TABLET | Freq: Every day | ORAL | Status: DC

## 2012-08-14 NOTE — Progress Notes (Signed)
htn- tolerating meds but she is concerned that losartan not as good as micardis. Discussed generic meds with her and husband.  Lipids- crestor- tolerating well.   Rare wheeze- she almost never uses albuterol  Past Medical History  Diagnosis Date  . Hyperlipidemia   . Hypertension   . Osteoporosis   . Colon polyps     history of  . Pernicious anemia   . Elevated LFTs     obstructive disease    History   Social History  . Marital Status: Married    Spouse Name: N/A    Number of Children: N/A  . Years of Education: N/A   Occupational History  . Not on file.   Social History Main Topics  . Smoking status: Former Smoker    Quit date: 01/17/2000  . Smokeless tobacco: Not on file  . Alcohol Use: 0.0 oz/week    2-3 drink(s) per week  . Drug Use: No  . Sexually Active: Not on file   Other Topics Concern  . Not on file   Social History Narrative  . No narrative on file    Past Surgical History  Procedure Laterality Date  . Ovarian cyst surgery  1968  . Appendectomy  1968    incendental  . Cataract surgery      Bilateral  . Retinal detachment surgery      left    Family History  Problem Relation Age of Onset  . Pernicious anemia Mother   . Cancer Father     lung/ bone  . Osteoporosis Father   . Hypothyroidism Father     Allergies  Allergen Reactions  . Cephalexin     REACTION: anaphylacic shock  . Codeine Nausea And Vomiting    Current Outpatient Prescriptions on File Prior to Visit  Medication Sig Dispense Refill  . albuterol (PROVENTIL HFA;VENTOLIN HFA) 108 (90 BASE) MCG/ACT inhaler Inhale 2 puffs into the lungs every 6 (six) hours as needed for wheezing.  1 Inhaler  2  . aspirin 81 MG tablet Take by mouth. 1/2 tab qod       . Cholecalciferol (VITAMIN D) 1000 UNITS capsule Take 1,000 Units by mouth daily.       . cyanocobalamin (,VITAMIN B-12,) 1000 MCG/ML injection INJECT 1 ML INTO THE MUSCLE EVERY 30 DAYS  10 mL  0  . Multiple Vitamin  (MULTIVITAMIN) tablet Take 1 tablet by mouth every other day.       . potassium chloride SA (K-DUR,KLOR-CON) 20 MEQ tablet Take 1 tablet (20 mEq total) by mouth daily.  90 tablet  3   No current facility-administered medications on file prior to visit.     patient denies chest pain, shortness of breath, orthopnea. Denies lower extremity edema, abdominal pain, change in appetite, change in bowel movements. Patient denies rashes, musculoskeletal complaints. No other specific complaints in a complete review of systems.   BP 134/82  Pulse 64  Temp(Src) 98.1 F (36.7 C) (Oral)  Wt 173 lb (78.472 kg)  BMI 29.68 kg/m2   Well-developed well-nourished female in no acute distress. HEENT exam atraumatic, normocephalic, extraocular muscles are intact. Neck is supple. No jugular venous distention no thyromegaly. Chest clear to auscultation without increased work of breathing. Cardiac exam S1 and S2 are regular. Abdominal exam active bowel sounds, soft, nontender. Extremities no edema. Neurologic exam she is alert without any motor sensory deficits. Gait is normal.

## 2012-08-15 NOTE — Assessment & Plan Note (Signed)
Continue to monitor Weight loss

## 2012-08-15 NOTE — Assessment & Plan Note (Signed)
Lipid Panel     Component Value Date/Time   CHOL 134 05/17/2011 0927   TRIG 196.0* 05/17/2011 0927   HDL 45.00 05/17/2011 0927   CHOLHDL 3 05/17/2011 0927   VLDL 39.2 05/17/2011 0927   LDLCALC 50 05/17/2011 0927    Will need to f/u  In the next 6 months

## 2012-10-23 DIAGNOSIS — Z23 Encounter for immunization: Secondary | ICD-10-CM | POA: Diagnosis not present

## 2012-11-17 ENCOUNTER — Other Ambulatory Visit: Payer: Self-pay | Admitting: Internal Medicine

## 2012-11-21 ENCOUNTER — Other Ambulatory Visit: Payer: Self-pay

## 2012-11-25 ENCOUNTER — Other Ambulatory Visit: Payer: Self-pay | Admitting: Obstetrics and Gynecology

## 2012-11-25 DIAGNOSIS — N6001 Solitary cyst of right breast: Secondary | ICD-10-CM

## 2012-11-25 DIAGNOSIS — N6009 Solitary cyst of unspecified breast: Secondary | ICD-10-CM | POA: Diagnosis not present

## 2012-12-05 ENCOUNTER — Ambulatory Visit
Admission: RE | Admit: 2012-12-05 | Discharge: 2012-12-05 | Disposition: A | Discharge status: Home / Self Care | Payer: Medicare Other | Source: Ambulatory Visit | Attending: Obstetrics and Gynecology | Admitting: Obstetrics and Gynecology

## 2012-12-05 DIAGNOSIS — N6459 Other signs and symptoms in breast: Secondary | ICD-10-CM | POA: Diagnosis not present

## 2012-12-05 DIAGNOSIS — N6001 Solitary cyst of right breast: Secondary | ICD-10-CM

## 2012-12-13 ENCOUNTER — Other Ambulatory Visit: Payer: Medicare Other

## 2012-12-23 ENCOUNTER — Other Ambulatory Visit (INDEPENDENT_AMBULATORY_CARE_PROVIDER_SITE_OTHER): Payer: Medicare Other

## 2012-12-23 DIAGNOSIS — E785 Hyperlipidemia, unspecified: Secondary | ICD-10-CM

## 2012-12-23 DIAGNOSIS — I10 Essential (primary) hypertension: Secondary | ICD-10-CM | POA: Diagnosis not present

## 2012-12-23 DIAGNOSIS — R7309 Other abnormal glucose: Secondary | ICD-10-CM | POA: Diagnosis not present

## 2012-12-23 LAB — BASIC METABOLIC PANEL
BUN: 18 mg/dL (ref 6–23)
CO2: 31 mEq/L (ref 19–32)
Calcium: 9 mg/dL (ref 8.4–10.5)
Chloride: 99 mEq/L (ref 96–112)
Creatinine, Ser: 0.6 mg/dL (ref 0.4–1.2)
Glucose, Bld: 111 mg/dL — ABNORMAL HIGH (ref 70–99)

## 2012-12-23 LAB — HEPATIC FUNCTION PANEL
ALT: 23 U/L (ref 0–35)
AST: 20 U/L (ref 0–37)
Albumin: 4.1 g/dL (ref 3.5–5.2)
Total Bilirubin: 0.7 mg/dL (ref 0.3–1.2)
Total Protein: 7.3 g/dL (ref 6.0–8.3)

## 2012-12-23 LAB — LIPID PANEL
Cholesterol: 161 mg/dL (ref 0–200)
LDL Cholesterol: 84 mg/dL (ref 0–99)
Total CHOL/HDL Ratio: 3
Triglycerides: 154 mg/dL — ABNORMAL HIGH (ref 0.0–149.0)

## 2012-12-23 LAB — HEMOGLOBIN A1C: Hgb A1c MFr Bld: 6.4 % (ref 4.6–6.5)

## 2012-12-29 NOTE — Assessment & Plan Note (Signed)
Controlled Continue same meds 

## 2012-12-29 NOTE — Progress Notes (Signed)
Patient here for f/u of hyperglycemia, htn, hyperlipidemia (treated).  She also has a hx of b12 deficiency (home b12 injection).  Has developed cough for past 10 days. Wheezing at night.   Past Medical History  Diagnosis Date  . Hyperlipidemia   . Hypertension   . Osteoporosis   . Colon polyps     history of  . Pernicious anemia   . Elevated LFTs     obstructive disease    History   Social History  . Marital Status: Married    Spouse Name: N/A    Number of Children: N/A  . Years of Education: N/A   Occupational History  . Not on file.   Social History Main Topics  . Smoking status: Former Smoker    Quit date: 01/17/2000  . Smokeless tobacco: Not on file  . Alcohol Use: 0.0 oz/week    2-3 drink(s) per week  . Drug Use: No  . Sexual Activity: Not on file   Other Topics Concern  . Not on file   Social History Narrative  . No narrative on file    Past Surgical History  Procedure Laterality Date  . Ovarian cyst surgery  1968  . Appendectomy  1968    incendental  . Cataract surgery      Bilateral  . Retinal detachment surgery      left    Family History  Problem Relation Age of Onset  . Pernicious anemia Mother   . Cancer Father     lung/ bone  . Osteoporosis Father   . Hypothyroidism Father     Allergies  Allergen Reactions  . Cephalexin     REACTION: anaphylacic shock  . Codeine Nausea And Vomiting    Current Outpatient Prescriptions on File Prior to Visit  Medication Sig Dispense Refill  . albuterol (PROVENTIL HFA;VENTOLIN HFA) 108 (90 BASE) MCG/ACT inhaler Inhale 2 puffs into the lungs every 6 (six) hours as needed for wheezing.  1 Inhaler  2  . aspirin 81 MG tablet Take by mouth. 1/2 tab qod       . Cholecalciferol (VITAMIN D) 1000 UNITS capsule Take 1,000 Units by mouth daily.       . CRESTOR 20 MG tablet Take 10 mg by mouth daily.      . cyanocobalamin (,VITAMIN B-12,) 1000 MCG/ML injection INJECT 1 ML INTO THE MUSCLE EVERY 30 DAYS  10 mL   0  . Multiple Vitamin (MULTIVITAMIN) tablet Take 1 tablet by mouth every other day.       . potassium chloride SA (K-DUR,KLOR-CON) 20 MEQ tablet TAKE 1 TABLET BY MOUTH EVERY DAY  90 tablet  0  . valsartan-hydrochlorothiazide (DIOVAN-HCT) 320-25 MG per tablet Take 1 tablet by mouth daily.  90 tablet  3   No current facility-administered medications on file prior to visit.     patient denies chest pain, shortness of breath, orthopnea. Denies lower extremity edema, abdominal pain, change in appetite, change in bowel movements. Patient denies rashes, musculoskeletal complaints. No other specific complaints in a complete review of systems.   .reviewed vitals  Well-developed well-nourished female in no acute distress. HEENT exam atraumatic, normocephalic, extraocular muscles are intact. Neck is supple. No jugular venous distention no thyromegaly. Chest bilateral wheezing without increased work of breathing. Cardiac exam S1 and S2 are regular. Abdominal exam active bowel sounds, soft, nontender. Extremities no edema. Neurologic exam she is alert without any motor sensory deficits. Gait is normal.  A/p New cough- she does  have some wheezing. Albuterol prn dulera 100 for 2 weeks I think her sxs will resolve quickly  HYPERTENSION Adequate control Continue same meds  HYPERGLYCEMIA Lab Results  Component Value Date   HGBA1C 6.4 12/23/2012   Controlled. Discussed need for weight loss and regular exercise  HYPERLIPIDEMIA Controlled Continue same meds

## 2012-12-29 NOTE — Assessment & Plan Note (Signed)
Adequate control Continue same meds 

## 2012-12-29 NOTE — Assessment & Plan Note (Signed)
Lab Results  Component Value Date   HGBA1C 6.4 12/23/2012   Controlled. Discussed need for weight loss and regular exercise

## 2012-12-30 ENCOUNTER — Ambulatory Visit (INDEPENDENT_AMBULATORY_CARE_PROVIDER_SITE_OTHER): Payer: Medicare Other | Admitting: Internal Medicine

## 2012-12-30 ENCOUNTER — Encounter: Payer: Self-pay | Admitting: Internal Medicine

## 2012-12-30 VITALS — BP 132/82 | HR 80 | Temp 98.2°F | Wt 178.0 lb

## 2012-12-30 DIAGNOSIS — E785 Hyperlipidemia, unspecified: Secondary | ICD-10-CM | POA: Diagnosis not present

## 2012-12-30 DIAGNOSIS — R059 Cough, unspecified: Secondary | ICD-10-CM

## 2012-12-30 DIAGNOSIS — R7309 Other abnormal glucose: Secondary | ICD-10-CM | POA: Diagnosis not present

## 2012-12-30 DIAGNOSIS — R05 Cough: Secondary | ICD-10-CM

## 2012-12-30 DIAGNOSIS — I10 Essential (primary) hypertension: Secondary | ICD-10-CM

## 2012-12-30 NOTE — Progress Notes (Signed)
Pre visit review using our clinic review tool, if applicable. No additional management support is needed unless otherwise documented below in the visit note. 

## 2013-02-13 ENCOUNTER — Other Ambulatory Visit: Payer: Self-pay | Admitting: Internal Medicine

## 2013-02-18 ENCOUNTER — Other Ambulatory Visit: Payer: Self-pay | Admitting: Internal Medicine

## 2013-02-25 DIAGNOSIS — Z130 Encounter for screening for diseases of the blood and blood-forming organs and certain disorders involving the immune mechanism: Secondary | ICD-10-CM | POA: Diagnosis not present

## 2013-02-25 DIAGNOSIS — Z1231 Encounter for screening mammogram for malignant neoplasm of breast: Secondary | ICD-10-CM | POA: Diagnosis not present

## 2013-02-25 DIAGNOSIS — Z01419 Encounter for gynecological examination (general) (routine) without abnormal findings: Secondary | ICD-10-CM | POA: Diagnosis not present

## 2013-03-17 DIAGNOSIS — R1032 Left lower quadrant pain: Secondary | ICD-10-CM | POA: Diagnosis not present

## 2013-03-18 DIAGNOSIS — H43819 Vitreous degeneration, unspecified eye: Secondary | ICD-10-CM | POA: Diagnosis not present

## 2013-03-27 DIAGNOSIS — G5600 Carpal tunnel syndrome, unspecified upper limb: Secondary | ICD-10-CM | POA: Diagnosis not present

## 2013-04-11 ENCOUNTER — Telehealth: Payer: Self-pay | Admitting: Internal Medicine

## 2013-04-11 NOTE — Telephone Encounter (Signed)
Pt is needing a referral for a GI doctor.

## 2013-04-15 NOTE — Telephone Encounter (Signed)
Pt is having RLQ pain.  Dr Matthew Saras did an u/s and said that her ovaries were ok and told pt she should see a GI dr.  Rockey Situ pt she should be seen first and evaluated before we do referral.  Appt scheduled with Padonda on 04/16/13 at 10:30

## 2013-04-16 ENCOUNTER — Ambulatory Visit (INDEPENDENT_AMBULATORY_CARE_PROVIDER_SITE_OTHER): Payer: Medicare Other | Admitting: Family

## 2013-04-16 ENCOUNTER — Encounter: Payer: Self-pay | Admitting: Family

## 2013-04-16 ENCOUNTER — Encounter: Payer: Self-pay | Admitting: Gastroenterology

## 2013-04-16 VITALS — BP 128/84 | HR 77 | Temp 98.5°F | Ht 64.0 in | Wt 181.0 lb

## 2013-04-16 DIAGNOSIS — K5730 Diverticulosis of large intestine without perforation or abscess without bleeding: Secondary | ICD-10-CM

## 2013-04-16 DIAGNOSIS — R1032 Left lower quadrant pain: Secondary | ICD-10-CM

## 2013-04-16 DIAGNOSIS — M79609 Pain in unspecified limb: Secondary | ICD-10-CM

## 2013-04-16 DIAGNOSIS — M79675 Pain in left toe(s): Secondary | ICD-10-CM

## 2013-04-16 NOTE — Progress Notes (Signed)
Subjective:    Patient ID: Cassandra James, female    DOB: Jan 08, 1946, 68 y.o.   MRN: [id number]  HPI  68 year old white female, nonsmoker then today with left lower quadrant pain x1 month. Reports having the 32nd warm, stinging sensation in her lower abdomen that occurs about twice a week. Denies any changes in her bowel movement, and no black stools, bloody stools, constipation or diarrhea. It is not associated with activity or food. She had an ultrasound done back gynecology that was normal. She has a history of diverticulosis and colon polyps. Last colonoscopy 2006.  Review of Systems  Constitutional: Negative.   HENT: Negative.   Respiratory: Negative.   Cardiovascular: Negative.   Gastrointestinal: Positive for abdominal pain. Negative for nausea, diarrhea and constipation.       Left lower quadrant  Genitourinary: Negative.   Musculoskeletal: Positive for arthralgias.       Left great toe pain  Skin: Negative.   Allergic/Immunologic: Negative.   Neurological: Negative.   Psychiatric/Behavioral: Negative.    Past Medical History  Diagnosis Date  . Hyperlipidemia   . Hypertension   . Osteoporosis   . Colon polyps     history of  . Pernicious anemia   . Elevated LFTs     obstructive disease    History   Social History  . Marital Status: Married    Spouse Name: N/A    Number of Children: N/A  . Years of Education: N/A   Occupational History  . Not on file.   Social History Main Topics  . Smoking status: Former Smoker    Quit date: 01/17/2000  . Smokeless tobacco: Not on file  . Alcohol Use: 0.0 oz/week    2-3 drink(s) per week  . Drug Use: No  . Sexual Activity: Not on file   Other Topics Concern  . Not on file   Social History Narrative  . No narrative on file    Past Surgical History  Procedure Laterality Date  . Ovarian cyst surgery  1968  . Appendectomy  1968    incendental  . Cataract surgery      Bilateral  . Retinal detachment surgery       left    Family History  Problem Relation Age of Onset  . Pernicious anemia Mother   . Cancer Father     lung/ bone  . Osteoporosis Father   . Hypothyroidism Father     Allergies  Allergen Reactions  . Cephalexin     REACTION: anaphylacic shock  . Codeine Nausea And Vomiting    Current Outpatient Prescriptions on File Prior to Visit  Medication Sig Dispense Refill  . albuterol (PROVENTIL HFA;VENTOLIN HFA) 108 (90 BASE) MCG/ACT inhaler Inhale 2 puffs into the lungs every 6 (six) hours as needed for wheezing.  1 Inhaler  2  . aspirin 81 MG tablet Take by mouth. 1/2 tab qod       . Cholecalciferol (VITAMIN D) 1000 UNITS capsule Take 1,000 Units by mouth daily.       . CRESTOR 20 MG tablet Take 10 mg by mouth daily.      . cyanocobalamin (,VITAMIN B-12,) 1000 MCG/ML injection INJECT 1 ML INTO THE MUSCLE EVERY 30 DAYS  10 mL  1  . Multiple Vitamin (MULTIVITAMIN) tablet Take 1 tablet by mouth every other day.       . potassium chloride SA (K-DUR,KLOR-CON) 20 MEQ tablet TAKE 1 TABLET BY MOUTH EVERY DAY  90 tablet  1  . valsartan-hydrochlorothiazide (DIOVAN-HCT) 320-25 MG per tablet Take 1 tablet by mouth daily.  90 tablet  3   No current facility-administered medications on file prior to visit.    BP 128/84  Pulse 77  Temp(Src) 98.5 F (36.9 C) (Oral)  Ht 5\' 4"  (1.626 m)  Wt 181 lb (82.101 kg)  BMI 31.05 kg/m2  SpO2 99%chart     Objective:   Physical Exam  Constitutional: She is oriented to person, place, and time. She appears well-developed and well-nourished.  HENT:  Right Ear: External ear normal.  Left Ear: External ear normal.  Nose: Nose normal.  Mouth/Throat: Oropharynx is clear and moist.  Neck: Normal range of motion. Neck supple.  Cardiovascular: Normal rate, regular rhythm and normal heart sounds.   Pulmonary/Chest: Effort normal and breath sounds normal.  Abdominal: Soft. Bowel sounds are normal. There is no tenderness. There is no rebound and no  guarding.  Musculoskeletal: She exhibits tenderness. She exhibits no edema.  Left great toe: Tenderness to palpation of the nail bed. Pedal pulse 2/2.  Neurological: She is alert and oriented to person, place, and time.  Skin: Skin is warm and dry.  Psychiatric: She has a normal mood and affect.          Assessment & Plan:  Cassandra James was seen today for no specified reason.  Diagnoses and associated orders for this visit:  Abdominal pain, left lower quadrant - Ambulatory referral to Gastroenterology  Diverticulosis of colon without hemorrhage  Pain of left great toe - DG Toe Great Left; Future   Cll the office with any questions or concerns. Recheck as scheduled, and as needed.

## 2013-04-16 NOTE — Patient Instructions (Signed)
Abdominal Pain, Adult °Many things can cause abdominal pain. Usually, abdominal pain is not caused by a disease and will improve without treatment. It can often be observed and treated at home. Your health care provider will do a physical exam and possibly order blood tests and X-rays to help determine the seriousness of your pain. However, in many cases, more time must pass before a clear cause of the pain can be found. Before that point, your health care provider may not know if you need more testing or further treatment. °HOME CARE INSTRUCTIONS  °Monitor your abdominal pain for any changes. The following actions may help to alleviate any discomfort you are experiencing: °· Only take over-the-counter or prescription medicines as directed by your health care provider. °· Do not take laxatives unless directed to do so by your health care provider. °· Try a clear liquid diet (broth, tea, or water) as directed by your health care provider. Slowly move to a bland diet as tolerated. °SEEK MEDICAL CARE IF: °· You have unexplained abdominal pain. °· You have abdominal pain associated with nausea or diarrhea. °· You have pain when you urinate or have a bowel movement. °· You experience abdominal pain that wakes you in the night. °· You have abdominal pain that is worsened or improved by eating food. °· You have abdominal pain that is worsened with eating fatty foods. °SEEK IMMEDIATE MEDICAL CARE IF:  °· Your pain does not go away within 2 hours. °· You have a fever. °· You keep throwing up (vomiting). °· Your pain is felt only in portions of the abdomen, such as the right side or the left lower portion of the abdomen. °· You pass bloody or black tarry stools. °MAKE SURE YOU: °· Understand these instructions.   °· Will watch your condition.   °· Will get help right away if you are not doing well or get worse.   °Document Released: 10/12/2004 Document Revised: 10/23/2012 Document Reviewed: 09/11/2012 °[employer]® Patient  Information ©2014 [employer]. ° °

## 2013-04-16 NOTE — Progress Notes (Signed)
Pre visit review using our clinic review tool, if applicable. No additional management support is needed unless otherwise documented below in the visit note. 

## 2013-04-21 ENCOUNTER — Other Ambulatory Visit: Payer: Self-pay | Admitting: Internal Medicine

## 2013-04-24 DIAGNOSIS — G5600 Carpal tunnel syndrome, unspecified upper limb: Secondary | ICD-10-CM | POA: Diagnosis not present

## 2013-04-28 DIAGNOSIS — L919 Hypertrophic disorder of the skin, unspecified: Secondary | ICD-10-CM | POA: Diagnosis not present

## 2013-04-28 DIAGNOSIS — L909 Atrophic disorder of skin, unspecified: Secondary | ICD-10-CM | POA: Diagnosis not present

## 2013-04-28 DIAGNOSIS — L821 Other seborrheic keratosis: Secondary | ICD-10-CM | POA: Diagnosis not present

## 2013-04-28 DIAGNOSIS — L719 Rosacea, unspecified: Secondary | ICD-10-CM | POA: Diagnosis not present

## 2013-04-28 DIAGNOSIS — L570 Actinic keratosis: Secondary | ICD-10-CM | POA: Diagnosis not present

## 2013-04-28 DIAGNOSIS — L820 Inflamed seborrheic keratosis: Secondary | ICD-10-CM | POA: Diagnosis not present

## 2013-05-07 ENCOUNTER — Telehealth: Payer: Self-pay | Admitting: Internal Medicine

## 2013-05-07 NOTE — Telephone Encounter (Signed)
ok 

## 2013-05-07 NOTE — Telephone Encounter (Signed)
Pt and husband are pt's of dr swords and would like to switch to you. Is that OK? Ok w/ dr swords? (iN OCT)

## 2013-06-06 ENCOUNTER — Ambulatory Visit (INDEPENDENT_AMBULATORY_CARE_PROVIDER_SITE_OTHER): Payer: Medicare Other | Admitting: Gastroenterology

## 2013-06-06 ENCOUNTER — Encounter: Payer: Self-pay | Admitting: Gastroenterology

## 2013-06-06 VITALS — BP 110/68 | HR 69 | Ht 63.0 in | Wt 182.0 lb

## 2013-06-06 DIAGNOSIS — R1032 Left lower quadrant pain: Secondary | ICD-10-CM

## 2013-06-06 DIAGNOSIS — Z1211 Encounter for screening for malignant neoplasm of colon: Secondary | ICD-10-CM | POA: Diagnosis not present

## 2013-06-06 MED ORDER — HYOSCYAMINE SULFATE 0.125 MG SL SUBL: 0.2500 mg | SUBLINGUAL_TABLET | SUBLINGUAL | Status: DC | PRN

## 2013-06-06 NOTE — Assessment & Plan Note (Signed)
Plan colonoscopy 2016 

## 2013-06-06 NOTE — Progress Notes (Signed)
_                                                                                                                History of Present Illness: Pleasant 68 year old white female with history of diverticulosis referred for evaluation of abdominal pain.  For several weeks she has had bleeding burning left lower quadrant pain that may last only minutes at a time.  It is unrelated to move her bowels or movement.  She denies change in bowel habits or rectal bleeding.  Colonoscopy in 2006 demonstrated diverticulosis and hyperplastic polyps.  Prior ultrasound demonstrated hepatic steatosis.    Past Medical History  Diagnosis Date  . Hyperlipidemia   . Hypertension   . Osteoporosis   . Colon polyps     history of  . Pernicious anemia   . Elevated LFTs     obstructive disease   Past Surgical History  Procedure Laterality Date  . Ovarian cyst surgery  1968  . Appendectomy  1968    incendental  . Cataract surgery      Bilateral  . Retinal detachment surgery      left   family history includes Cancer in her father; Hypothyroidism in her father; Osteoporosis in her father; Pernicious anemia in her mother. Current Outpatient Prescriptions  Medication Sig Dispense Refill  . albuterol (PROVENTIL HFA;VENTOLIN HFA) 108 (90 BASE) MCG/ACT inhaler Inhale 2 puffs into the lungs every 6 (six) hours as needed for wheezing.  1 Inhaler  2  . aspirin 81 MG tablet Take by mouth. 1/2 tab qod       . Cholecalciferol (VITAMIN D) 1000 UNITS capsule Take 1,000 Units by mouth daily.       . CRESTOR 20 MG tablet Take 10 mg by mouth daily.      . cyanocobalamin (,VITAMIN B-12,) 1000 MCG/ML injection INJECT 1 ML INTO THE MUSCLE EVERY 30 DAYS  10 mL  1  . Multiple Vitamin (MULTIVITAMIN) tablet Take 1 tablet by mouth every other day.       . potassium chloride SA (K-DUR,KLOR-CON) 20 MEQ tablet TAKE 1 TABLET BY MOUTH EVERY DAY  90 tablet  1  . valsartan-hydrochlorothiazide (DIOVAN-HCT) 320-25 MG per  tablet Take 1 tablet by mouth daily.  90 tablet  3   No current facility-administered medications for this visit.   Allergies as of 06/06/2013 - Review Complete 06/06/2013  Allergen Reaction Noted  . Cephalexin    . Codeine Nausea And Vomiting 08/14/2012    reports that she quit smoking about 13 years ago. She does not have any smokeless tobacco history on file. She reports that she drinks alcohol. She reports that she does not use illicit drugs.     Review of Systems: Pertinent positive and negative review of systems were noted in the above HPI section. All other review of systems were otherwise negative.  Vital signs were reviewed in today's medical record Physical Exam: General: Well developed , well nourished, no acute distress Skin: anicteric Head: Normocephalic and  atraumatic Eyes:  sclerae anicteric, EOMI Ears: Normal auditory acuity Mouth: No deformity or lesions Neck: Supple, no masses or thyromegaly Lungs: Clear throughout to auscultation Heart: Regular rate and rhythm; no murmurs, rubs or bruits Abdomen: Soft, non tender and non distended. No masses, hepatosplenomegaly or hernias noted. Normal Bowel sounds Rectal:deferred Musculoskeletal: Symmetrical with no gross deformities  Skin: No lesions on visible extremities Pulses:  Normal pulses noted Extremities: No clubbing, cyanosis, edema or deformities noted Neurological: Alert oriented x 4, grossly nonfocal Cervical Nodes:  No significant cervical adenopathy Inguinal Nodes: No significant inguinal adenopathy Psychological:  Alert and cooperative. Normal mood and affect  See Assessment and Plan under Problem List

## 2013-06-06 NOTE — Assessment & Plan Note (Signed)
Several week history of very intermittent, transient left lower quadrant pain lasting only minutes at a time.  Doubt serious underlying GI pathology.  Recommendations #1 hyomax sublingual as needed #2 patient was carefully instructed to contact me if symptoms worsen or she develops rectal bleeding or change of bowel habits #3 colonoscopy 2016

## 2013-06-06 NOTE — Patient Instructions (Signed)
We have sent  medications to your pharmacy for you to pick up at your convenience. You will need a colonoscopy in 1 year. CC: Phoebe Sharps MD

## 2013-06-30 ENCOUNTER — Encounter: Payer: Self-pay | Admitting: Internal Medicine

## 2013-06-30 ENCOUNTER — Ambulatory Visit (INDEPENDENT_AMBULATORY_CARE_PROVIDER_SITE_OTHER): Payer: Medicare Other | Admitting: Internal Medicine

## 2013-06-30 VITALS — BP 124/62 | HR 72 | Temp 98.2°F | Ht 63.0 in | Wt 182.0 lb

## 2013-06-30 DIAGNOSIS — I10 Essential (primary) hypertension: Secondary | ICD-10-CM

## 2013-06-30 DIAGNOSIS — R7309 Other abnormal glucose: Secondary | ICD-10-CM | POA: Diagnosis not present

## 2013-06-30 DIAGNOSIS — R739 Hyperglycemia, unspecified: Secondary | ICD-10-CM

## 2013-06-30 LAB — HEMOGLOBIN A1C: Hgb A1c MFr Bld: 6.3 % (ref 4.6–6.5)

## 2013-06-30 NOTE — Progress Notes (Signed)
htn- tolerating meds without difficulty  Lipids Lipid Panel     Component Value Date/Time   CHOL 161 12/23/2012 0810   TRIG 154.0* 12/23/2012 0810   HDL 46.30 12/23/2012 0810   CHOLHDL 3 12/23/2012 0810   VLDL 30.8 12/23/2012 0810   LDLCALC 84 12/23/2012 0810   Hyperglycemia- Last a1c= 6.4%  She has not tried to diet or follow an exercise plan.  Reviewed problem list, allergies, medications.  BP 124/62  Pulse 72  Temp(Src) 98.2 F (36.8 C) (Oral)  Ht 5\' 3"  (1.6 m)  Wt 182 lb (82.555 kg)  BMI 32.25 kg/m2  well-developed well-nourished female in no acute distress. HEENT exam atraumatic, normocephalic, neck supple without jugular venous distention. Chest clear to auscultation cardiac exam S1-S2 are regular. Abdominal exam overweight with bowel sounds, soft and nontender. Extremities no edema. Neurologic exam is alert with a normal gait.  HYPERGLYCEMIA Discussed need for regular exercise. She should follow a low-fat diet. She should follow a low-calorie diet. She should exercise daily.  HYPERTENSION BP: 124/62 mmHg  Fair control. Continue same meds.

## 2013-06-30 NOTE — Progress Notes (Signed)
Pre visit review using our clinic review tool, if applicable. No additional management support is needed unless otherwise documented below in the visit note. 

## 2013-07-04 NOTE — Assessment & Plan Note (Signed)
Discussed need for regular exercise. She should follow a low-fat diet. She should follow a low-calorie diet. She should exercise daily.

## 2013-07-04 NOTE — Assessment & Plan Note (Signed)
BP: 124/62 mmHg  Fair control. Continue same meds.

## 2013-08-15 ENCOUNTER — Other Ambulatory Visit: Payer: Self-pay | Admitting: Family Medicine

## 2013-08-15 ENCOUNTER — Other Ambulatory Visit: Payer: Self-pay | Admitting: Internal Medicine

## 2013-08-15 NOTE — Telephone Encounter (Signed)
Patient has not been seen by Dr Maudie Mercury.

## 2013-09-11 ENCOUNTER — Encounter: Payer: Self-pay | Admitting: Gastroenterology

## 2013-10-27 ENCOUNTER — Ambulatory Visit: Payer: Medicare Other | Admitting: Family Medicine

## 2013-11-10 ENCOUNTER — Encounter: Payer: Self-pay | Admitting: Family Medicine

## 2013-11-10 ENCOUNTER — Ambulatory Visit (INDEPENDENT_AMBULATORY_CARE_PROVIDER_SITE_OTHER): Payer: Medicare Other | Admitting: Family Medicine

## 2013-11-10 VITALS — BP 116/80 | HR 58 | Temp 97.8°F | Ht 63.0 in | Wt 180.3 lb

## 2013-11-10 DIAGNOSIS — Z23 Encounter for immunization: Secondary | ICD-10-CM | POA: Diagnosis not present

## 2013-11-10 DIAGNOSIS — R739 Hyperglycemia, unspecified: Secondary | ICD-10-CM | POA: Diagnosis not present

## 2013-11-10 DIAGNOSIS — I10 Essential (primary) hypertension: Secondary | ICD-10-CM

## 2013-11-10 DIAGNOSIS — M1990 Unspecified osteoarthritis, unspecified site: Secondary | ICD-10-CM | POA: Insufficient documentation

## 2013-11-10 DIAGNOSIS — E538 Deficiency of other specified B group vitamins: Secondary | ICD-10-CM

## 2013-11-10 DIAGNOSIS — E119 Type 2 diabetes mellitus without complications: Secondary | ICD-10-CM

## 2013-11-10 HISTORY — DX: Hyperglycemia, unspecified: R73.9

## 2013-11-10 NOTE — Patient Instructions (Signed)
BEFORE YOU LEAVE: -prevnar 13 -flu shot - high dose per her preference -schedule you annual wellness exam in about 4-6 months  We recommend the following healthy lifestyle measures: - eat a healthy diet consisting of lots of vegetables, fruits, beans, nuts, seeds, healthy meats such as white chicken and fish and whole grains.  - avoid fried foods, fast food, processed foods, sodas, red meet and other fattening foods.  - get a least 150 minutes of aerobic exercise per week.

## 2013-11-10 NOTE — Progress Notes (Signed)
Pre visit review using our clinic review tool, if applicable. No additional management support is needed unless otherwise documented below in the visit note. 

## 2013-11-10 NOTE — Addendum Note (Signed)
Addended by: Lahoma Crocker A on: 11/10/2013 12:12 PM   Modules accepted: Orders

## 2013-11-10 NOTE — Progress Notes (Signed)
No chief complaint on file.   HPI:  Cassandra James is here to establish care.  Last PCP and physical:  Has the following chronic problems and concerns today:  Patient Active Problem List   Diagnosis Date Noted  . Hyperglycemia 11/10/2013  . Osteoarthritis 11/10/2013  . B12 deficiency 11/10/2013  . HYPERLIPIDEMIA 08/09/2006  . ANEMIA, PERNICIOUS 08/09/2006  . Essential hypertension 08/09/2006  . OSTEOPOROSIS 08/09/2006   HTN: -meds: valsartan-hctz 320-25 -denies: CP, SOB, DOEs, swelling, palpitations  HLD: -meds: crestor -denies: leg cramps, cog impariment  IBS: -managed by Dr. Deatra Ina -intermittent LLQ very brief, rare episodes -uses levsin for this  Hyperglycemia/Prediabetes: -working diet  -no regular exercise -denies: polyuria, polydipsia, vision changes  OA: -mild, knees, hips, fingers -worse after sitting -better after moving -chronic  Vit B12: -hx of b12 def -on b12 inj chronically - does injections at home  Osteoporosis: -completed fosamax in 2009 -reports all dexas normal since -sees Dr. Matthew Saras for this and female exams    ROS negative for unless reported above: fevers, unintentional weight loss, hearing or vision loss, chest pain, palpitations, struggling to breath, hemoptysis, melena, hematochezia, hematuria, falls, loc, si, thoughts of self harm  Past Medical History  Diagnosis Date  . Hyperlipidemia   . Hypertension   . Osteoporosis   . Colon polyps     history of  . Pernicious anemia   . Elevated LFTs     obstructive disease  . HIATAL HERNIA 02/05/2009    Qualifier: Diagnosis of  By: Harriet Pho    . DECREASED HEARING 11/25/2008    Qualifier: Diagnosis of  By: Scherrie Gerlach    . COLONIC POLYPS, HX OF 08/09/2006    Qualifier: Diagnosis of  By: Scherrie Gerlach    . Hyperglycemia 11/10/2013    Past Surgical History  Procedure Laterality Date  . Ovarian cyst surgery  1968  . Appendectomy  1968    incendental  . Cataract  surgery      Bilateral  . Retinal detachment surgery      left    Family History  Problem Relation Age of Onset  . Pernicious anemia Mother   . Cancer Father     lung/ bone  . Osteoporosis Father   . Hypothyroidism Father     History   Social History  . Marital Status: Married    Spouse Name: N/A    Number of Children: N/A  . Years of Education: N/A   Social History Main Topics  . Smoking status: Former Smoker    Quit date: 01/17/2000  . Smokeless tobacco: None  . Alcohol Use: 0.0 oz/week    2-3 drink(s) per week  . Drug Use: No  . Sexual Activity: None   Other Topics Concern  . 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 1/2 tab qod , Disp: , Rfl: ;  Cholecalciferol (VITAMIN D) 1000 UNITS capsule, Take 1,000 Units by mouth daily. , Disp: , Rfl: ;  CRESTOR 20 MG tablet, TAKE 1 TABLET BY MOUTH EVERY DAY, Disp: 90 tablet, Rfl: 3;  cyanocobalamin (,VITAMIN B-12,) 1000 MCG/ML injection, INJECT 1 ML INTO THE MUSCLE EVERY 30 DAYS, Disp: 10 mL, Rfl: 1 hyoscyamine (LEVSIN SL) 0.125 MG SL tablet, Place 2 tablets (0.25 mg total) under the tongue every 4 (four) hours as needed., Disp: 30 tablet, Rfl: 0;  potassium chloride SA (K-DUR,KLOR-CON) 20 MEQ tablet, TAKE 1 TABLET BY MOUTH EVERY DAY, Disp: 90 tablet, Rfl: 3;  PROAIR HFA 108 (90 BASE) MCG/ACT inhaler, INHALE 2 PUFFS INTO THE LUNGS EVERY 6 HOURS AS NEEDED FOR WHEEZING, Disp: 8.5 g, Rfl: 5 valsartan-hydrochlorothiazide (DIOVAN-HCT) 320-25 MG per tablet, TAKE 1 TABLET BY MOUTH EVERY DAY, Disp: 90 tablet, Rfl: 3  EXAM:  Filed Vitals:   11/10/13 1124  BP: 116/80  Pulse: 58  Temp: 97.8 F (36.6 C)    Body mass index is  31.95 kg/(m^2).  GENERAL: vitals reviewed and listed above, alert, oriented, appears well hydrated and in no acute distress  HEENT: atraumatic, conjunttiva clear, no obvious abnormalities on inspection of external nose and ears  NECK: no obvious masses on inspection  LUNGS: clear to auscultation bilaterally, no wheezes, rales or rhonchi, good air movement  CV: HRRR, no peripheral edema  MS: moves all extremities without noticeable abnormality  PSYCH: pleasant and cooperative, no obvious depression or anxiety  ASSESSMENT AND PLAN:  Discussed the following assessment and plan:  Type 2 diabetes mellitus without complication  Hyperglycemia  Osteoarthritis, unspecified osteoarthritis type, unspecified site  Essential hypertension  B12 deficiency  -We reviewed the PMH, PSH, FH, SH, Meds and Allergies. -We provided refills for any medications we will prescribe as needed. -We addressed current concerns per orders and patient instructions. -We have asked for records for pertinent exams, studies, vaccines and notes from previous providers. -We have advised patient to follow up per instructions below. -discussed chronic jt pain - sounds like OA, offered rheum referral and advised regular exercise, she reports symptoms mild and she prefers not workup now and will work on exercise  -Patient advised to return or notify a doctor immediately if symptoms worsen or persist or new concerns arise.  Patient Instructions  BEFORE YOU LEAVE: -prevnar 13 -flu shot - high dose per her preference -schedule you annual wellness exam in about 4-6 months  We recommend the following healthy lifestyle measures: - eat a healthy diet consisting of lots of vegetables, fruits, beans, nuts, seeds, healthy meats such as white chicken and fish and whole grains.  - avoid fried foods, fast food, processed foods, sodas, red meet and other fattening foods.  - get a least 150 minutes of aerobic exercise per week.         Colin Benton R.

## 2013-11-11 ENCOUNTER — Telehealth: Payer: Self-pay | Admitting: Family Medicine

## 2013-11-11 NOTE — Telephone Encounter (Signed)
emmi emailed °

## 2014-01-16 HISTORY — PX: TRAPEZIUM RESECTION: SHX2576

## 2014-01-16 HISTORY — PX: HAND TENDON SURGERY: SHX663

## 2014-01-16 HISTORY — PX: CARPAL TUNNEL RELEASE: SHX101

## 2014-01-22 DIAGNOSIS — M180 Bilateral primary osteoarthritis of first carpometacarpal joints: Secondary | ICD-10-CM | POA: Diagnosis not present

## 2014-01-22 DIAGNOSIS — G5601 Carpal tunnel syndrome, right upper limb: Secondary | ICD-10-CM | POA: Diagnosis not present

## 2014-01-22 DIAGNOSIS — G5602 Carpal tunnel syndrome, left upper limb: Secondary | ICD-10-CM | POA: Diagnosis not present

## 2014-01-26 DIAGNOSIS — L821 Other seborrheic keratosis: Secondary | ICD-10-CM | POA: Diagnosis not present

## 2014-01-26 DIAGNOSIS — L570 Actinic keratosis: Secondary | ICD-10-CM | POA: Diagnosis not present

## 2014-02-04 DIAGNOSIS — M654 Radial styloid tenosynovitis [de Quervain]: Secondary | ICD-10-CM | POA: Diagnosis not present

## 2014-02-04 DIAGNOSIS — M180 Bilateral primary osteoarthritis of first carpometacarpal joints: Secondary | ICD-10-CM | POA: Diagnosis not present

## 2014-02-04 DIAGNOSIS — G5602 Carpal tunnel syndrome, left upper limb: Secondary | ICD-10-CM | POA: Diagnosis not present

## 2014-02-04 DIAGNOSIS — G8918 Other acute postprocedural pain: Secondary | ICD-10-CM | POA: Diagnosis not present

## 2014-02-04 DIAGNOSIS — M129 Arthropathy, unspecified: Secondary | ICD-10-CM | POA: Diagnosis not present

## 2014-02-09 DIAGNOSIS — M180 Bilateral primary osteoarthritis of first carpometacarpal joints: Secondary | ICD-10-CM | POA: Diagnosis not present

## 2014-02-12 ENCOUNTER — Telehealth: Payer: Self-pay | Admitting: Family Medicine

## 2014-02-12 NOTE — Telephone Encounter (Signed)
Pt request refill cyanocobalamin (,VITAMIN B-12,) 1000 MCG/ML injection Pt would like a paper rx to take to the pharm of her choice. (going to price check) 12 ea/ 1 ml vials

## 2014-02-13 NOTE — Telephone Encounter (Signed)
Pt stop by office and it aware rx is not ready for pick up and that dr kim is out of office until Monday. Please call pt when rx is ready for pick up

## 2014-02-16 NOTE — Telephone Encounter (Signed)
Ok to rx

## 2014-02-17 MED ORDER — CYANOCOBALAMIN 1000 MCG/ML IJ SOLN: 1000.0000 ug | INTRAMUSCULAR | Status: DC

## 2014-02-17 NOTE — Telephone Encounter (Signed)
Rx done and the pt is aware this was left at the front desk for her to pick up.

## 2014-02-26 ENCOUNTER — Other Ambulatory Visit: Payer: Self-pay | Admitting: Obstetrics and Gynecology

## 2014-02-26 DIAGNOSIS — Z124 Encounter for screening for malignant neoplasm of cervix: Secondary | ICD-10-CM | POA: Diagnosis not present

## 2014-02-26 DIAGNOSIS — Z1231 Encounter for screening mammogram for malignant neoplasm of breast: Secondary | ICD-10-CM | POA: Diagnosis not present

## 2014-02-26 DIAGNOSIS — Z6831 Body mass index (BMI) 31.0-31.9, adult: Secondary | ICD-10-CM | POA: Diagnosis not present

## 2014-02-27 ENCOUNTER — Other Ambulatory Visit (HOSPITAL_COMMUNITY): Payer: Self-pay | Admitting: Obstetrics and Gynecology

## 2014-02-27 DIAGNOSIS — R1032 Left lower quadrant pain: Secondary | ICD-10-CM

## 2014-02-27 LAB — CYTOLOGY - PAP

## 2014-03-04 ENCOUNTER — Ambulatory Visit (HOSPITAL_COMMUNITY): Payer: Medicare Other

## 2014-03-05 ENCOUNTER — Other Ambulatory Visit (HOSPITAL_COMMUNITY)
Admission: AD | Admit: 2014-03-05 | Discharge: 2014-03-05 | Disposition: A | Discharge status: Home / Self Care | Payer: Medicare Other | Source: Ambulatory Visit | Attending: Obstetrics and Gynecology | Admitting: Obstetrics and Gynecology

## 2014-03-05 DIAGNOSIS — R109 Unspecified abdominal pain: Secondary | ICD-10-CM | POA: Insufficient documentation

## 2014-03-05 LAB — CREATININE, SERUM
Creatinine, Ser: 0.82 mg/dL (ref 0.50–1.10)
GFR calc Af Amer: 82 mL/min — ABNORMAL LOW (ref >90)
GFR calc non Af Amer: 71 mL/min — ABNORMAL LOW (ref >90)

## 2014-03-05 LAB — BUN: BUN: 19 mg/dL (ref 6–23)

## 2014-03-09 ENCOUNTER — Ambulatory Visit (HOSPITAL_COMMUNITY)
Admission: RE | Admit: 2014-03-09 | Discharge: 2014-03-09 | Disposition: A | Discharge status: Home / Self Care | Payer: Medicare Other | Source: Ambulatory Visit | Attending: Obstetrics and Gynecology | Admitting: Obstetrics and Gynecology

## 2014-03-09 DIAGNOSIS — R1032 Left lower quadrant pain: Secondary | ICD-10-CM | POA: Diagnosis not present

## 2014-03-09 DIAGNOSIS — M5137 Other intervertebral disc degeneration, lumbosacral region: Secondary | ICD-10-CM | POA: Insufficient documentation

## 2014-03-09 DIAGNOSIS — I708 Atherosclerosis of other arteries: Secondary | ICD-10-CM | POA: Insufficient documentation

## 2014-03-09 DIAGNOSIS — G8929 Other chronic pain: Secondary | ICD-10-CM | POA: Insufficient documentation

## 2014-03-09 MED ORDER — IOHEXOL 300 MG/ML  SOLN
100.0000 mL | Freq: Once | INTRAMUSCULAR | Status: AC | PRN
  Administered 2014-03-09: 100 mL via INTRAVENOUS

## 2014-03-13 ENCOUNTER — Ambulatory Visit (INDEPENDENT_AMBULATORY_CARE_PROVIDER_SITE_OTHER): Payer: Medicare Other | Admitting: Family Medicine

## 2014-03-13 ENCOUNTER — Encounter: Payer: Self-pay | Admitting: Family Medicine

## 2014-03-13 VITALS — BP 124/80 | HR 56 | Temp 97.1°F | Ht 63.25 in | Wt 181.4 lb

## 2014-03-13 DIAGNOSIS — D510 Vitamin B12 deficiency anemia due to intrinsic factor deficiency: Secondary | ICD-10-CM

## 2014-03-13 DIAGNOSIS — Z0000 Encounter for general adult medical examination without abnormal findings: Secondary | ICD-10-CM

## 2014-03-13 DIAGNOSIS — E785 Hyperlipidemia, unspecified: Secondary | ICD-10-CM | POA: Diagnosis not present

## 2014-03-13 DIAGNOSIS — I10 Essential (primary) hypertension: Secondary | ICD-10-CM

## 2014-03-13 DIAGNOSIS — M810 Age-related osteoporosis without current pathological fracture: Secondary | ICD-10-CM | POA: Diagnosis not present

## 2014-03-13 DIAGNOSIS — M1990 Unspecified osteoarthritis, unspecified site: Secondary | ICD-10-CM

## 2014-03-13 DIAGNOSIS — R739 Hyperglycemia, unspecified: Secondary | ICD-10-CM

## 2014-03-13 LAB — LIPID PANEL
CHOL/HDL RATIO: 3
CHOLESTEROL: 139 mg/dL (ref 0–200)
HDL: 49.1 mg/dL (ref >39.00)
LDL CALC: 63 mg/dL (ref 0–99)
NonHDL: 89.9
TRIGLYCERIDES: 136 mg/dL (ref 0.0–149.0)
VLDL: 27.2 mg/dL (ref 0.0–40.0)

## 2014-03-13 LAB — HEMOGLOBIN A1C: HEMOGLOBIN A1C: 6.4 % (ref 4.6–6.5)

## 2014-03-13 NOTE — Progress Notes (Signed)
HCPOA: spouse, [phone]

## 2014-03-13 NOTE — Progress Notes (Signed)
Medicare Annual Preventive Care Visit  (initial annual wellness or annual wellness exam) She sees Dr. Matthew Saras for her gyn exams. Had her gyn exam last month and had CT scan and was ok for pelvic pain. She had renal function tests there.   Concerns and/or follow up today:  HTN: -meds: valsartan-hctz 320-25 -denies: CP, SOB, DOEs, swelling, palpitations  HLD: -meds: crestor -denies: leg cramps, cog impariment  IBS: -managed by Dr. Deatra Ina -intermittent LLQ very brief, rare episodes -uses levsin for this  Hyperglycemia -working diet  -no regular exercise -denies: polyuria, polydipsia, vision changes  OA: -mild, knees, hips, fingers -worse after sitting -better after moving -recent wrist surgery with Dr. Burney Gauze - reports she is doing well -chronic  Vit B12: -hx of b12 def -on b12 inj chronically - does injections at home  Osteoporosis: -completed fosamax in 2009 -reports all dexas normal since -sees Dr. Matthew Saras for this and female exams   ROS: negative for report of fevers, unintentional weight loss, vision changes, vision loss, hearing loss or change, chest pain, sob, hemoptysis, melena, hematochezia, hematuria, genital discharge or lesions, falls, bleeding or bruising, loc, thoughts of suicide or self harm, memory loss  1.) Patient-completed health risk assessment  - completed and reviewed, see scanned documentation  2.) Review of Medical History: -PMH, PSH, Family History and current specialty and care providers reviewed and updated and listed below  - see scanned in document in chart and below  Past Medical History  Diagnosis Date  . Hyperlipidemia   . Hypertension   . Osteoporosis   . Colon polyps     history of  . Pernicious anemia   . Elevated LFTs     obstructive disease  . HIATAL HERNIA 02/05/2009    Qualifier: Diagnosis of  By: Harriet Pho    . DECREASED HEARING 11/25/2008    Qualifier: Diagnosis of  By: Scherrie Gerlach    . COLONIC POLYPS,  HX OF 08/09/2006    Qualifier: Diagnosis of  By: Scherrie Gerlach    . Hyperglycemia 11/10/2013    Past Surgical History  Procedure Laterality Date  . Ovarian cyst surgery  1968  . Appendectomy  1968    incendental  . Cataract surgery      Bilateral  . Retinal detachment surgery      left    History   Social History  . Marital Status: Married    Spouse Name: N/A  . Number of Children: N/A  . Years of Education: N/A   Occupational History  . Not on file.   Social History Main Topics  . Smoking status: Former Smoker    Quit date: 01/17/2000  . Smokeless tobacco: Not on file  . Alcohol Use: 0.0 oz/week    2-3 drink(s) per week  . Drug Use: No  . Sexual Activity: Not on file   Other Topics Concern  . 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 NO  History of falling? NO  Any trouble  with IADLs - using a phone, using transportation, grocery shopping, preparing meals, doing housework, doing laundry, taking medications and managing money? NO  Advance Directives? YES  See summary of recommendations in Patient Instructions below.  4.) Physical Exam Filed Vitals:   03/13/14 0809  BP: 124/80  Pulse: 56  Temp: 97.1 F (36.2 C)   Estimated body mass index is 31.86 kg/(m^2) as calculated from the following:   Height as of this encounter: 5' 3.25" (1.607 m).   Weight as of this encounter: 181 lb 6.4 oz (82.283 kg).  EKG (optional): deferred  General: alert, appear well hydrated and in no acute distress  HEENT: visual acuity grossly intact  CV: HRRR  Lungs: CTA bilaterally  FOOT exam: see  chart  Psych: pleasant and cooperative, no obvious depression or anxiety  Cognition: grossly intact  See patient instructions for recommendations.  Education and counseling regarding the above review of health provided with a plan for the following: -see scanned patient completed form for further details -fall prevention strategies discussed  -healthy lifestyle discussed -importance and resources for completing advanced directives discussed -see patient instructions below for any other recommendations provided  4)The following written screening schedule of preventive measures were reviewed with assessment and plan made per below, orders and patient instructions:      AAA screening n/a     Alcohol screening done     Obesity Screening and counseling done     STI screening n/a     Tobacco Screening done       Pneumococcal (PPSV23 -one dose after 64, one before if risk factors), influenza yearly and hepatitis B vaccines (if high risk - end stage renal disease, IV drugs, homosexual men, live in home for mentally retarded, hemophilia receiving factors) ASSESSMENT/PLAN: done      Screening mammograph (yearly if >40) ASSESSMENT/PLAN: done      Screening Pap smear/pelvic exam (q2 years) ASSESSMENT/PLAN: n/a      Prostate cancer screening ASSESSMENT/PLAN: n/a      Colorectal cancer screening (FOBT yearly or flex sig q4y or colonoscopy q10y or barium enema q4y) ASSESSMENT/PLAN: done      Diabetes outpatient self-management training services ASSESSMENT/PLAN: done      Bone mass measurements(covered q2y if indicated - estrogen def, osteoporosis, hyperparathyroid, vertebral abnormalities, osteoporosis or steroids) ASSESSMENT/PLAN: sees gyn for thiss      Screening for glaucoma(q1y if high risk - diabetes, FH, AA and > 70 or hispanic and > 2) ASSESSMENT/PLAN: sees optho      Medical nutritional therapy for individuals with diabetes or renal disease ASSESSMENT/PLAN: n/a       Cardiovascular screening blood tests (lipids q5y) ASSESSMENT/PLAN: doing today      Diabetes screening tests ASSESSMENT/PLAN: doing today   7.) Summary: -risk factors and conditions per above assessment were discussed and treatment, recommendations and referrals were offered per documentation above and orders and patient instructions.  Medicare annual wellness visit, subsequent  Essential hypertension  Hyperlipemia - Plan: Lipid panel  Hyperglycemia - Plan: Hemoglobin A1c  Osteoarthritis, unspecified osteoarthritis type, unspecified site  ANEMIA, PERNICIOUS  Osteoporosis  There are no Patient Instructions on file for this visit.

## 2014-03-13 NOTE — Progress Notes (Signed)
Pre visit review using our clinic review tool, if applicable. No additional management support is needed unless otherwise documented below in the visit note. 

## 2014-03-16 ENCOUNTER — Telehealth: Payer: Self-pay | Admitting: Family Medicine

## 2014-03-16 NOTE — Telephone Encounter (Signed)
emmi emailed °

## 2014-03-17 DIAGNOSIS — M25642 Stiffness of left hand, not elsewhere classified: Secondary | ICD-10-CM | POA: Diagnosis not present

## 2014-03-17 DIAGNOSIS — M79642 Pain in left hand: Secondary | ICD-10-CM | POA: Diagnosis not present

## 2014-03-17 DIAGNOSIS — G5602 Carpal tunnel syndrome, left upper limb: Secondary | ICD-10-CM | POA: Diagnosis not present

## 2014-03-17 DIAGNOSIS — M1812 Unilateral primary osteoarthritis of first carpometacarpal joint, left hand: Secondary | ICD-10-CM | POA: Diagnosis not present

## 2014-03-20 DIAGNOSIS — H04123 Dry eye syndrome of bilateral lacrimal glands: Secondary | ICD-10-CM | POA: Diagnosis not present

## 2014-03-20 DIAGNOSIS — H40012 Open angle with borderline findings, low risk, left eye: Secondary | ICD-10-CM | POA: Diagnosis not present

## 2014-03-20 DIAGNOSIS — H43813 Vitreous degeneration, bilateral: Secondary | ICD-10-CM | POA: Diagnosis not present

## 2014-04-02 ENCOUNTER — Ambulatory Visit (INDEPENDENT_AMBULATORY_CARE_PROVIDER_SITE_OTHER): Payer: Medicare Other | Admitting: Podiatry

## 2014-04-02 ENCOUNTER — Encounter: Payer: Self-pay | Admitting: Podiatry

## 2014-04-02 VITALS — BP 123/63 | HR 72 | Resp 16

## 2014-04-02 DIAGNOSIS — L600 Ingrowing nail: Secondary | ICD-10-CM | POA: Diagnosis not present

## 2014-04-02 MED ORDER — NEOMYCIN-POLYMYXIN-HC 1 % OT SOLN: OTIC | Status: DC

## 2014-04-02 NOTE — Patient Instructions (Addendum)

## 2014-04-02 NOTE — Progress Notes (Signed)
   Subjective:    Patient ID: Cassandra James, female    DOB: April 22, 1945, 68 y.o.   MRN: [id number]  HPI Comments: "I have toenail problems"  Patient c/o tender 1st toes bilateral, both borders, right over left, for about 6 months. The areas get swollen and red occasionally. She tried to trim out when sore.  Toe Pain       Review of Systems  HENT: Positive for hearing loss.   Respiratory: Positive for apnea and chest tightness.   Hematological: Bruises/bleeds easily.  All other systems reviewed and are negative.      Objective:   Physical Exam: I have reviewed her past medical history medications allergy surgery social history and review of systems. Pulses are strongly palpable bilateral. Neurologic sensorium is intact percent one C monofilament. Deep tendon reflexes are intact bilateral and muscle strength +5 over 5 dorsiflexion plantar flexors and inverters and everters all intrinsic musculature is intact. Orthopedic evaluation demonstrates all joints distal to the ankle for range of motion without crepitation. Cutaneous evaluation demonstrates supple well-hydrated cutis with exception of sharp incurvated nail margins along the tibiofibular border hallux right. Onychocryptosis is noted bilaterally but right greater than left.        Assessment & Plan:  Assessment: Ingrown nail paronychia onychocryptosis hallux right greater than left.  Plan: Total nail avulsion with matrixectomy hallux right. Tolerated procedure well after local anesthetic was administered the area was prepped and draped his normal sterile fashion the nail was avulsed. 3 applications of phenol were applied 30 seconds each and then neutralized with isopropyl alcohol. She was to soaking twice daily for which she was given both oral and written home-going instructions as well as a prescription for Cortisporin Otic.

## 2014-04-09 ENCOUNTER — Ambulatory Visit (INDEPENDENT_AMBULATORY_CARE_PROVIDER_SITE_OTHER): Payer: Medicare Other | Admitting: Podiatry

## 2014-04-09 DIAGNOSIS — M79642 Pain in left hand: Secondary | ICD-10-CM | POA: Diagnosis not present

## 2014-04-09 DIAGNOSIS — L600 Ingrowing nail: Secondary | ICD-10-CM

## 2014-04-09 DIAGNOSIS — M1812 Unilateral primary osteoarthritis of first carpometacarpal joint, left hand: Secondary | ICD-10-CM | POA: Diagnosis not present

## 2014-04-09 DIAGNOSIS — M25642 Stiffness of left hand, not elsewhere classified: Secondary | ICD-10-CM | POA: Diagnosis not present

## 2014-04-09 DIAGNOSIS — G5602 Carpal tunnel syndrome, left upper limb: Secondary | ICD-10-CM | POA: Diagnosis not present

## 2014-04-09 NOTE — Progress Notes (Signed)
She presents today for a follow-up of her matrixectomy hallux right. She states this seems to be doing good and I continue to soak.  Objective: Vital signs are stable she is alert and oriented 3. Total nail avulsion with matrixectomy appears to be healing nicely. There is no granulation tissue no signs of infection. Epithelialization is occurring.  Assessment: Well-healed surgical toe hallux right.  Plan: Continue to cover in the daily and leave open at night. Discontinue Betadine start with Epsom salts and water soaks.

## 2014-04-09 NOTE — Patient Instructions (Signed)

## 2014-04-16 ENCOUNTER — Encounter: Payer: Self-pay | Admitting: Gastroenterology

## 2014-04-29 DIAGNOSIS — L570 Actinic keratosis: Secondary | ICD-10-CM | POA: Diagnosis not present

## 2014-04-29 DIAGNOSIS — L821 Other seborrheic keratosis: Secondary | ICD-10-CM | POA: Diagnosis not present

## 2014-04-29 DIAGNOSIS — D1801 Hemangioma of skin and subcutaneous tissue: Secondary | ICD-10-CM | POA: Diagnosis not present

## 2014-04-29 DIAGNOSIS — D485 Neoplasm of uncertain behavior of skin: Secondary | ICD-10-CM | POA: Diagnosis not present

## 2014-05-07 ENCOUNTER — Ambulatory Visit (INDEPENDENT_AMBULATORY_CARE_PROVIDER_SITE_OTHER): Payer: Medicare Other | Admitting: Podiatry

## 2014-05-07 ENCOUNTER — Encounter: Payer: Self-pay | Admitting: Podiatry

## 2014-05-07 DIAGNOSIS — L03031 Cellulitis of right toe: Secondary | ICD-10-CM | POA: Diagnosis not present

## 2014-05-07 DIAGNOSIS — L03041 Acute lymphangitis of right toe: Secondary | ICD-10-CM

## 2014-05-07 DIAGNOSIS — L03011 Cellulitis of right finger: Secondary | ICD-10-CM

## 2014-05-07 MED ORDER — DOXYCYCLINE HYCLATE 100 MG PO TABS: 100.0000 mg | ORAL_TABLET | Freq: Two times a day (BID) | ORAL | Status: DC

## 2014-05-07 MED ORDER — MUPIROCIN 2 % EX OINT
TOPICAL_OINTMENT | CUTANEOUS | Status: DC
Start: 2014-05-07 — End: 2014-07-15

## 2014-05-07 NOTE — Progress Notes (Signed)
She presents today after a total nail avulsion matrixectomy hallux right she states that the toe was red and swollen yesterday to the level of the metatarsophalangeal joint. She states that today looks much better. She denies fever chills nausea vomiting muscle aches and pains and states that she continues to soak the toe regular basis.  Objective: Vital signs are stable she's alert and oriented 3 hallux right foot demonstrates well-healing granular base with epithelialization occurring. Mild erythema to the level of DIP joint of that hallux. No purulence no malodor no signs of deep infection.  Assessment: Possible superficial infection that is allowing this to heal slowly.  Plan: Started her on doxycycline and Bactroban ointment will follow up with her in 2-3 weeks. She will continue all other conservative therapies.

## 2014-05-11 ENCOUNTER — Encounter: Payer: Self-pay | Admitting: Gastroenterology

## 2014-05-21 DIAGNOSIS — G5602 Carpal tunnel syndrome, left upper limb: Secondary | ICD-10-CM | POA: Diagnosis not present

## 2014-05-21 DIAGNOSIS — G5601 Carpal tunnel syndrome, right upper limb: Secondary | ICD-10-CM | POA: Diagnosis not present

## 2014-05-21 DIAGNOSIS — M1812 Unilateral primary osteoarthritis of first carpometacarpal joint, left hand: Secondary | ICD-10-CM | POA: Diagnosis not present

## 2014-06-09 ENCOUNTER — Encounter: Payer: Self-pay | Admitting: Podiatry

## 2014-06-09 ENCOUNTER — Ambulatory Visit (INDEPENDENT_AMBULATORY_CARE_PROVIDER_SITE_OTHER): Payer: Medicare Other | Admitting: Podiatry

## 2014-06-09 VITALS — BP 122/75 | HR 62 | Resp 12

## 2014-06-09 DIAGNOSIS — L03031 Cellulitis of right toe: Secondary | ICD-10-CM | POA: Diagnosis not present

## 2014-06-09 DIAGNOSIS — L03011 Cellulitis of right finger: Secondary | ICD-10-CM

## 2014-06-09 MED ORDER — DOXYCYCLINE HYCLATE 100 MG PO TABS: 100.0000 mg | ORAL_TABLET | Freq: Two times a day (BID) | ORAL | Status: DC

## 2014-06-09 NOTE — Progress Notes (Signed)
She presents today for follow-up of matrixectomy hallux right 2 months. She states it is still losing instability mildly tender. She has not been soaking.  Objective: Vital signs are stable she is alert and oriented 3. There is some mild fibrin deposition overlying the nail bed with granulation tissue does not demonstrate any type of infection.  Assessment: Slowly healing wound hallux right.  Land: Discussed etiology pathology conservative versus surgical therapies. Suggested she talk to her primary about slow wound healing. I also suggested that she start soaking and one cup of Epson salts and one quarter warm water. She can also clean the fibrin from the wound at least once or twice a week with hydrogen peroxide and a Q-tip. I will follow-up with her in 1 month if needed.

## 2014-06-30 ENCOUNTER — Ambulatory Visit: Payer: Medicare Other | Admitting: Podiatry

## 2014-07-02 ENCOUNTER — Encounter: Payer: Self-pay | Admitting: Family Medicine

## 2014-07-02 ENCOUNTER — Ambulatory Visit (INDEPENDENT_AMBULATORY_CARE_PROVIDER_SITE_OTHER): Payer: Medicare Other | Admitting: Family Medicine

## 2014-07-02 VITALS — BP 116/74 | HR 74 | Temp 98.2°F | Ht 63.25 in | Wt 181.1 lb

## 2014-07-02 DIAGNOSIS — J309 Allergic rhinitis, unspecified: Secondary | ICD-10-CM | POA: Diagnosis not present

## 2014-07-02 DIAGNOSIS — H9192 Unspecified hearing loss, left ear: Secondary | ICD-10-CM | POA: Diagnosis not present

## 2014-07-02 DIAGNOSIS — H40012 Open angle with borderline findings, low risk, left eye: Secondary | ICD-10-CM | POA: Diagnosis not present

## 2014-07-02 NOTE — Patient Instructions (Addendum)
Please schedule routing follow up in 1-2 months  Please have your eye doctor fax your diabetic eye exam results today  Please get colonoscopy as scheduled  Use afrin twice daily for 3 days  Use flonase 2 sprays each nostril daily for 21 days  If these treatments do not work please see your audiologist or ear, nose and throat doctor

## 2014-07-02 NOTE — Progress Notes (Signed)
Pre visit review using our clinic review tool, if applicable. No additional management support is needed unless otherwise documented below in the visit note. 

## 2014-07-02 NOTE — Progress Notes (Signed)
HPI:  Acute visit for:  Hearing loss- L ear: -started yesterday -feels full and some hearing loss on L -denies: pain, drainage, popping, clicking -she has has some allergy issues that are mild - nasal congestion -wears hearing aides bilat, reports they are old  HM: -eye exam -needs labs in 1 month, colonoscopy due   ROS: See pertinent positives and negatives per HPI.  Past Medical History  Diagnosis Date  . Hyperlipidemia   . Hypertension   . Osteoporosis   . Colon polyps     history of  . Pernicious anemia   . Elevated LFTs     obstructive disease  . HIATAL HERNIA 02/05/2009    Qualifier: Diagnosis of  By: Harriet Pho    . DECREASED HEARING 11/25/2008    Qualifier: Diagnosis of  By: Scherrie Gerlach    . COLONIC POLYPS, HX OF 08/09/2006    Qualifier: Diagnosis of  By: Scherrie Gerlach    . Hyperglycemia 11/10/2013    Past Surgical History  Procedure Laterality Date  . Ovarian cyst surgery  1968  . Appendectomy  1968    incendental  . Cataract surgery      Bilateral  . Retinal detachment surgery      left  . Hand tendon surgery  01/16/2014    Dr Burney Gauze  . Toenail excision Right     Dr Patrick Jupiter great toenail excision    Family History  Problem Relation Age of Onset  . Pernicious anemia Mother   . Cancer Father     lung/ bone  . Osteoporosis Father   . Hypothyroidism Father     History   Social History  . Marital Status: Married    Spouse Name: N/A  . Number of Children: N/A  . Years of Education: N/A   Social History Main Topics  . Smoking status: Former Smoker    Quit date: 01/17/2000  . Smokeless tobacco: Not on file  . Alcohol Use: 0.0 oz/week    2-3 drink(s) per week  . Drug Use: No  . Sexual Activity: Not on file   Other Topics Concern  . 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  aspirin 81 MG tablet, Take by mouth. 1/2 tab qod , Disp: , Rfl:  .  Cholecalciferol (VITAMIN D) 1000 UNITS capsule, Take 1,000 Units by mouth daily. , Disp: , Rfl:  .  CRESTOR 20 MG tablet, TAKE 1 TABLET BY MOUTH EVERY DAY, Disp: 90 tablet, Rfl: 3 .  cyanocobalamin (,VITAMIN B-12,) 1000 MCG/ML injection, Inject 1 mL (1,000 mcg total) into the muscle every 30 (thirty) days., Disp: 1 mL, Rfl: 12 .  doxycycline (VIBRA-TABS) 100 MG tablet, Take 1 tablet (100 mg total) by mouth 2 (two) times daily., Disp: 20 tablet, Rfl: 1 .  hyoscyamine (LEVSIN SL) 0.125 MG SL tablet, Place 2 tablets (0.25 mg total) under the tongue every 4 (four) hours as needed., Disp: 30 tablet, Rfl: 0 .  mupirocin ointment (BACTROBAN) 2 %, Apply to wound after soaking BID, Disp: 30 g, Rfl: 1 .  NEOMYCIN-POLYMYXIN-HYDROCORTISONE (CORTISPORIN)  1 % SOLN otic solution, Apply 1-2 drops to toe BID after soaking, Disp: 10 mL, Rfl: 1 .  potassium chloride SA (K-DUR,KLOR-CON) 20 MEQ tablet, TAKE 1 TABLET BY MOUTH EVERY DAY, Disp: 90 tablet, Rfl: 3 .  PROAIR HFA 108 (90 BASE) MCG/ACT inhaler, INHALE 2 PUFFS INTO THE LUNGS EVERY 6 HOURS AS NEEDED FOR WHEEZING, Disp: 8.5 g, Rfl: 5 .  valsartan-hydrochlorothiazide (DIOVAN-HCT) 320-25 MG per tablet, TAKE 1 TABLET BY MOUTH EVERY DAY, Disp: 90 tablet, Rfl: 3  EXAM:  Filed Vitals:   07/02/14 0913  BP: 116/74  Pulse: 74  Temp: 98.2 F (36.8 C)    Body mass index is 31.81 kg/(m^2).  GENERAL: vitals reviewed and listed above, alert, oriented, appears well hydrated and in no acute distress  HEENT: atraumatic, conjunttiva clear, no obvious abnormalities on inspection of external nose and ears, normal appearance of ear canals and TMs with clear effusion L, clear nasal congestion, mild post oropharyngeal erythema with PND, no tonsillar edema or exudate, no sinus  TTP  NECK: no obvious masses on inspection  LUNGS: clear to auscultation bilaterally, no wheezes, rales or rhonchi, good air movement  CV: HRRR, no peripheral edema  MS: moves all extremities without noticeable abnormality  PSYCH: pleasant and cooperative, no obvious depression or anxiety  ASSESSMENT AND PLAN:  Discussed the following assessment and plan:  Hearing loss, left  Allergic rhinitis, unspecified allergic rhinitis type  -possibly eustachian tube dysfunction vs hearing aid malfunction most likely  -tx per instructions with follow up if not responding -Patient advised to return or notify a doctor immediately if symptoms worsen or persist or new concerns arise.  Patient Instructions  Please schedule routing follow up in 1-2 months  Please have your eye doctor fax your diabetic eye exam results today  Please get colonoscopy as scheduled  Use afrin twice daily for 3 days  Use flonase 2 sprays each nostril daily for 21 days  If these treatments do not work please see your audiologist or ear, nose and throat doctor     Colin Benton R.

## 2014-07-15 ENCOUNTER — Ambulatory Visit (AMBULATORY_SURGERY_CENTER): Payer: Self-pay | Admitting: *Deleted

## 2014-07-15 VITALS — Ht 63.5 in | Wt 182.4 lb

## 2014-07-15 DIAGNOSIS — Z1211 Encounter for screening for malignant neoplasm of colon: Secondary | ICD-10-CM

## 2014-07-15 MED ORDER — NA SULFATE-K SULFATE-MG SULF 17.5-3.13-1.6 GM/177ML PO SOLN
1.0000 | Freq: Once | ORAL | Status: DC
Start: 2014-07-15 — End: 2014-07-29

## 2014-07-15 NOTE — Progress Notes (Signed)
No egg or soy allergy No issues with past sedation No home 02 use No diet pills emmi video

## 2014-07-29 ENCOUNTER — Ambulatory Visit (AMBULATORY_SURGERY_CENTER): Payer: Medicare Other | Admitting: Gastroenterology

## 2014-07-29 ENCOUNTER — Encounter: Payer: Self-pay | Admitting: Gastroenterology

## 2014-07-29 VITALS — BP 143/72 | HR 52 | Temp 97.9°F | Resp 19 | Ht 63.5 in | Wt 182.0 lb

## 2014-07-29 DIAGNOSIS — K5730 Diverticulosis of large intestine without perforation or abscess without bleeding: Secondary | ICD-10-CM

## 2014-07-29 DIAGNOSIS — Z1211 Encounter for screening for malignant neoplasm of colon: Secondary | ICD-10-CM | POA: Diagnosis not present

## 2014-07-29 DIAGNOSIS — E119 Type 2 diabetes mellitus without complications: Secondary | ICD-10-CM | POA: Diagnosis not present

## 2014-07-29 DIAGNOSIS — I10 Essential (primary) hypertension: Secondary | ICD-10-CM | POA: Diagnosis not present

## 2014-07-29 MED ORDER — SODIUM CHLORIDE 0.9 % IV SOLN: 500.0000 mL | INTRAVENOUS | Status: DC

## 2014-07-29 NOTE — Op Note (Signed)
[facility]  [address] [place], [id number]   COLONOSCOPY PROCEDURE REPORT  PATIENT: Cassandra, James  MR#: [id number] BIRTHDATE: 07-30-1945 , 61  yrs. old GENDER: female ENDOSCOPIST: Inda Castle, MD REFERRED VC:[id number] Kim, D.O. PROCEDURE DATE:  07/29/2014 PROCEDURE:   Colonoscopy, screening First Screening Colonoscopy - Avg.  risk and is 50 yrs.  old or older - No.  Prior Negative Screening - Now for repeat screening. 10 or more years since last screening  History of Adenoma - Now for follow-up colonoscopy & has been > or = to 3 yrs.  N/A  Polyps removed today? No Recommend repeat exam, <10 yrs? No ASA CLASS:   Class II INDICATIONS:Colorectal Neoplasm Risk Assessment for this procedure is average risk. MEDICATIONS: Monitored anesthesia care and Propofol 200 mg IV  DESCRIPTION OF PROCEDURE:   After the risks benefits and alternatives of the procedure were thoroughly explained, informed consent was obtained.  The digital rectal exam revealed no abnormalities of the rectum.   The LB CF-H180AL Loaner [id number] endoscope was introduced through the anus and advanced to the cecum, which was identified by both the appendix and ileocecal valve. No adverse events experienced.   The quality of the prep was (Suprep was used) excellent.  The instrument was then slowly withdrawn as the colon was fully examined. Estimated blood loss is zero unless otherwise noted in this procedure report.      COLON FINDINGS: There was moderate diverticulosis noted in the descending colon and sigmoid colon with associated colonic narrowing and muscular hypertrophy.   The examination was otherwise normal.  Retroflexed views revealed no abnormalities. The time to cecum = 7.9 Withdrawal time = 7:15.   The scope was withdrawn and the procedure completed. COMPLICATIONS: There were no immediate complications.  ENDOSCOPIC IMPRESSION: 1.   There was moderate diverticulosis noted in the  descending colon and sigmoid colon 2.   The examination was otherwise normal  RECOMMENDATIONS: Continue current colorectal screening recommendations for "routine risk" patients with a repeat colonoscopy in 10 years.  eSigned:  Inda Castle, MD 07/29/2014 10:51 AM   cc:

## 2014-07-29 NOTE — Patient Instructions (Signed)
Please see Procedure report for findings and recommendations  YOU HAD AN ENDOSCOPIC PROCEDURE TODAY AT THE [facility]:   Refer to the procedure report that was given to you for any specific questions about what was found during the examination.  If the procedure report does not answer your questions, please call your gastroenterologist to clarify.  If you requested that your care partner not be given the details of your procedure findings, then the procedure report has been included in a sealed envelope for you to review at your convenience later.  YOU SHOULD EXPECT: Some feelings of bloating in the abdomen. Passage of more gas than usual.  Walking can help get rid of the air that was put into your GI tract during the procedure and reduce the bloating. If you had a lower endoscopy (such as a colonoscopy or flexible sigmoidoscopy) you may notice spotting of blood in your stool or on the toilet paper. If you underwent a bowel prep for your procedure, you may not have a normal bowel movement for a few days.  Please Note:  You might notice some irritation and congestion in your nose or some drainage.  This is from the oxygen used during your procedure.  There is no need for concern and it should clear up in a day or so.  SYMPTOMS TO REPORT IMMEDIATELY:   Following lower endoscopy (colonoscopy or flexible sigmoidoscopy):  Excessive amounts of blood in the stool  Significant tenderness or worsening of abdominal pains  Swelling of the abdomen that is new, acute  Fever of 100F or higher   Following upper endoscopy (EGD)  Vomiting of blood or coffee ground material  New chest pain or pain under the shoulder blades  Painful or persistently difficult swallowing  New shortness of breath  Fever of 100F or higher  Black, tarry-looking stools  For urgent or emergent issues, a gastroenterologist can be reached at any hour by calling [phone].   DIET: Your first meal following the  procedure should be a small meal and then it is ok to progress to your normal diet. Heavy or fried foods are harder to digest and may make you feel nauseous or bloated.  Likewise, meals heavy in dairy and vegetables can increase bloating.  Drink plenty of fluids but you should avoid alcoholic beverages for 24 hours.  ACTIVITY:  You should plan to take it easy for the rest of today and you should NOT DRIVE or use heavy machinery until tomorrow (because of the sedation medicines used during the test).    FOLLOW UP: Our staff will call the number listed on your records the next business day following your procedure to check on you and address any questions or concerns that you may have regarding the information given to you following your procedure. If we do not reach you, we will leave a message.  However, if you are feeling well and you are not experiencing any problems, there is no need to return our call.  We will assume that you have returned to your regular daily activities without incident.  If any biopsies were taken you will be contacted by phone or by letter within the next 1-3 weeks.  Please call us at [phone] if you have not heard about the biopsies in 3 weeks.    SIGNATURES/CONFIDENTIALITY: You and/or your care partner have signed paperwork which will be entered into your electronic medical record.  These signatures attest to the fact that that the information   above on your After Visit Summary has been reviewed and is understood.  Full responsibility of the confidentiality of this discharge information lies with you and/or your care-partner.  Please follow all discharge instructions given to you by the recovery room nurse. If you have any questions or problems after discharge please call one of the numbers listed above. You will receive a phone call in the am to see how you are doing and answer any questions you may have. Thank you for choosing [facility] for your health  care needs. 

## 2014-07-29 NOTE — Progress Notes (Signed)
Report to PACU, RN, vss, BBS= Clear.  

## 2014-07-30 ENCOUNTER — Telehealth: Payer: Self-pay | Admitting: *Deleted

## 2014-07-30 NOTE — Telephone Encounter (Signed)
  Follow up Call-  Call back number 07/29/2014  Post procedure Call Back phone  # [phone]  Permission to leave phone message Yes     Patient questions:  Do you have a fever, pain , or abdominal swelling? No. Pain Score  0 *  Have you tolerated food without any problems? Yes.    Have you been able to return to your normal activities? Yes.    Do you have any questions about your discharge instructions: Diet   No. Medications  No. Follow up visit  No.  Do you have questions or concerns about your Care? No.  Actions: * If pain score is 4 or above: No action needed, pain <4.

## 2014-08-10 DIAGNOSIS — G5601 Carpal tunnel syndrome, right upper limb: Secondary | ICD-10-CM | POA: Diagnosis not present

## 2014-08-24 ENCOUNTER — Other Ambulatory Visit: Payer: Self-pay | Admitting: Internal Medicine

## 2014-08-31 ENCOUNTER — Encounter: Payer: Self-pay | Admitting: Family Medicine

## 2014-08-31 ENCOUNTER — Ambulatory Visit (INDEPENDENT_AMBULATORY_CARE_PROVIDER_SITE_OTHER): Payer: Medicare Other | Admitting: Family Medicine

## 2014-08-31 VITALS — BP 98/70 | HR 74 | Temp 98.3°F | Ht 63.25 in | Wt 179.1 lb

## 2014-08-31 DIAGNOSIS — E538 Deficiency of other specified B group vitamins: Secondary | ICD-10-CM

## 2014-08-31 DIAGNOSIS — I10 Essential (primary) hypertension: Secondary | ICD-10-CM | POA: Diagnosis not present

## 2014-08-31 DIAGNOSIS — E785 Hyperlipidemia, unspecified: Secondary | ICD-10-CM | POA: Diagnosis not present

## 2014-08-31 DIAGNOSIS — R739 Hyperglycemia, unspecified: Secondary | ICD-10-CM

## 2014-08-31 NOTE — Patient Instructions (Signed)
BEFORE YOU LEAVE: -schedule physical in Feb 2017  We recommend the following healthy lifestyle measures: - eat a healthy diet consisting of small portions of vegetables, fruits, beans, nuts, seeds, healthy meats such as white chicken and fish and whole grains.  - avoid fried foods, fast food, processed foods, sodas, red meet and other fattening foods.  - get a least 150 minutes of aerobic exercise per week.

## 2014-08-31 NOTE — Progress Notes (Signed)
HPI:  HTN: -meds: valsartan-hctz 320-25 -denies: CP, SOB, DOEs, swelling, palpitations  HLD: -meds: crestor -denies: leg cramps, cog impariment  IBS: -managed by Dr. Deatra Ina -intermittent LLQ very brief, rare episodes -uses levsin for this  Hyperglycemia/Obesity: -working on diet -no regular exercise - walks some and works in the garden -denies: polyuria, polydipsia, vision changes  OA: -chronic -mild, knees, hips, fingers -worse after sitting -better after moving  Vit B12: -hx of b12 def -on b12 inj chronically - does injections at home  Osteoporosis: -completed fosamax in 2009 -reports all dexas normal since -sees Dr. Matthew Saras for this and female exams  ROS: See pertinent positives and negatives per HPI.  Past Medical History  Diagnosis Date  . Hyperlipidemia   . Hypertension   . Osteoporosis     was osteopenia-last bone scan all clear   . Colon polyps     history of  . Pernicious anemia   . Elevated LFTs     obstructive disease  . HIATAL HERNIA 02/05/2009    Qualifier: Diagnosis of  By: Harriet Pho    . DECREASED HEARING 11/25/2008    Qualifier: Diagnosis of  By: Scherrie Gerlach    . COLONIC POLYPS, HX OF 08/09/2006    Qualifier: Diagnosis of  By: Scherrie Gerlach    . Hyperglycemia 11/10/2013  . Cataract     Past Surgical History  Procedure Laterality Date  . Ovarian cyst surgery  1968  . Appendectomy  1968    incendental  . Cataract surgery  2011    Bilateral  . Retinal detachment surgery  2011    left  . Hand tendon surgery  01/16/2014    Dr Burney Gauze  . Toenail excision Right     Dr Patrick Jupiter great toenail excision  . Tubal ligation  1979  . Trapezium resection Left 2016    left wrist   . Carpal tunnel release Left 2016  . Colonoscopy      Family History  Problem Relation Age of Onset  . Pernicious anemia Mother   . Cancer Father     lung/ bone  . Osteoporosis Father   . Hypothyroidism Father   . Colon cancer Neg Hx   .  Colon polyps Neg Hx   . Rectal cancer Neg Hx   . Stomach cancer Neg Hx     Social History   Social History  . Marital Status: Married    Spouse Name: N/A  . Number of Children: N/A  . Years of Education: N/A   Social History Main Topics  . Smoking status: Former Smoker    Quit date: 01/17/2000  . Smokeless tobacco: Never Used  . Alcohol Use: 1.2 - 1.8 oz/week    2-3 Standard drinks or equivalent per week     Comment: 3 times a week   . Drug Use: No  . Sexual Activity: Not Asked   Other Topics Concern  . 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  aspirin 81 MG tablet, Take by mouth. 1/2 tab qod , Disp: , Rfl:  .  Cholecalciferol (  VITAMIN D) 1000 UNITS capsule, Take 1,000 Units by mouth daily. , Disp: , Rfl:  .  CRESTOR 20 MG tablet, TAKE 1 TABLET BY MOUTH EVERY DAY, Disp: 90 tablet, Rfl: 3 .  cyanocobalamin (,VITAMIN B-12,) 1000 MCG/ML injection, Inject 1 mL (1,000 mcg total) into the muscle every 30 (thirty) days., Disp: 1 mL, Rfl: 12 .  hyoscyamine (LEVSIN SL) 0.125 MG SL tablet, Place 2 tablets (0.25 mg total) under the tongue every 4 (four) hours as needed., Disp: 30 tablet, Rfl: 0 .  potassium chloride SA (K-DUR,KLOR-CON) 20 MEQ tablet, TAKE 1 TABLET BY MOUTH EVERY DAY, Disp: 90 tablet, Rfl: 0 .  PROAIR HFA 108 (90 BASE) MCG/ACT inhaler, INHALE 2 PUFFS INTO THE LUNGS EVERY 6 HOURS AS NEEDED FOR WHEEZING, Disp: 8.5 g, Rfl: 5 .  valsartan-hydrochlorothiazide (DIOVAN-HCT) 320-25 MG per tablet, TAKE 1 TABLET BY MOUTH EVERY DAY, Disp: 90 tablet, Rfl: 0  EXAM:  Filed Vitals:   08/31/14 0849  BP: 98/70  Pulse: 74  Temp: 98.3 F (36.8 C)    Body mass index is 31.46 kg/(m^2).  GENERAL: vitals  reviewed and listed above, alert, oriented, appears well hydrated and in no acute distress  HEENT: atraumatic, conjunttiva clear, no obvious abnormalities on inspection of external nose and ears  NECK: no obvious masses on inspection  LUNGS: clear to auscultation bilaterally, no wheezes, rales or rhonchi, good air movement  CV: HRRR, no peripheral edema  MS: moves all extremities without noticeable abnormality  PSYCH: pleasant and cooperative, no obvious depression or anxiety  ASSESSMENT AND PLAN:  Discussed the following assessment and plan:  Hyperglycemia  B12 deficiency  Hyperlipemia  Essential hypertension  -Patient advised to return or notify a doctor immediately if symptoms worsen or persist or new concerns arise.  Patient Instructions  BEFORE YOU LEAVE: -schedule physical in Feb 2017  We recommend the following healthy lifestyle measures: - eat a healthy diet consisting of small portions of vegetables, fruits, beans, nuts, seeds, healthy meats such as white chicken and fish and whole grains.  - avoid fried foods, fast food, processed foods, sodas, red meet and other fattening foods.  - get a least 150 minutes of aerobic exercise per week.         Colin Benton R.

## 2014-08-31 NOTE — Progress Notes (Signed)
Pre visit review using our clinic review tool, if applicable. No additional management support is needed unless otherwise documented below in the visit note. 

## 2014-09-11 ENCOUNTER — Ambulatory Visit: Payer: Medicare Other | Admitting: Family Medicine

## 2014-09-22 ENCOUNTER — Telehealth: Payer: Self-pay | Admitting: Family Medicine

## 2014-09-23 ENCOUNTER — Ambulatory Visit (INDEPENDENT_AMBULATORY_CARE_PROVIDER_SITE_OTHER): Payer: Medicare Other

## 2014-09-23 DIAGNOSIS — Z23 Encounter for immunization: Secondary | ICD-10-CM | POA: Diagnosis not present

## 2014-10-08 ENCOUNTER — Telehealth: Payer: Self-pay | Admitting: Family Medicine

## 2014-10-08 DIAGNOSIS — B028 Zoster with other complications: Secondary | ICD-10-CM | POA: Diagnosis not present

## 2014-10-08 NOTE — Telephone Encounter (Signed)
I called the pt and informed her of the message below and I offered her an appt but she stated she can only do an AM appt on Friday.  I checked each providers schedule here for tomorrow morning and there are no openings and offered an afternoon appt.  She stated she cannot do this and will go to an urgent care.

## 2014-10-08 NOTE — Telephone Encounter (Signed)
If thinks she has shingles needs to see a doctor ASAP as treatment works best when initiated within 3 days of symptoms starting. I advise she be seen tomorrow or if out of town she go to an ucc.

## 2014-10-08 NOTE — Telephone Encounter (Signed)
Pt had shingle vaccine and thinks she may have shingles. Pt can not come in today nor Friday. Pt states the earliest she can come in Monday morning. Can I use acute slot?

## 2014-10-14 ENCOUNTER — Ambulatory Visit (INDEPENDENT_AMBULATORY_CARE_PROVIDER_SITE_OTHER): Payer: Medicare Other | Admitting: Family Medicine

## 2014-10-14 ENCOUNTER — Encounter: Payer: Self-pay | Admitting: Family Medicine

## 2014-10-14 VITALS — BP 136/72 | HR 80 | Temp 98.7°F | Ht 63.25 in

## 2014-10-14 DIAGNOSIS — R739 Hyperglycemia, unspecified: Secondary | ICD-10-CM | POA: Diagnosis not present

## 2014-10-14 DIAGNOSIS — I10 Essential (primary) hypertension: Secondary | ICD-10-CM | POA: Diagnosis not present

## 2014-10-14 DIAGNOSIS — B029 Zoster without complications: Secondary | ICD-10-CM | POA: Diagnosis not present

## 2014-10-14 LAB — BASIC METABOLIC PANEL
BUN: 26 mg/dL — ABNORMAL HIGH (ref 6–23)
CALCIUM: 9.3 mg/dL (ref 8.4–10.5)
CO2: 28 meq/L (ref 19–32)
CREATININE: 0.96 mg/dL (ref 0.40–1.20)
Chloride: 100 mEq/L (ref 96–112)
GFR: 60.86 mL/min (ref >60.00)
Glucose, Bld: 178 mg/dL — ABNORMAL HIGH (ref 70–99)
Potassium: 4.2 mEq/L (ref 3.5–5.1)
SODIUM: 137 meq/L (ref 135–145)

## 2014-10-14 LAB — HEMOGLOBIN A1C: Hgb A1c MFr Bld: 6.3 % (ref 4.6–6.5)

## 2014-10-14 NOTE — Progress Notes (Signed)
HPI:   Shingles: -started last week and she did at our recommendation go to [facility] as was out of town -reports on antiviral and steroid -doing much better -small patch of itchy vesicles R flank now resolving -denies: fevers, malaise, HA  HTN/Hyperglycemia/Obesity: -meds: crestor 20, asa, valsartan-hctz 320-25 -denies: CP, SOB, DOE  ROS: See pertinent positives and negatives per HPI.  Past Medical History  Diagnosis Date  . Hyperlipidemia   . Hypertension   . Osteoporosis     was osteopenia-last bone scan all clear   . Colon polyps     history of  . Pernicious anemia   . Elevated LFTs     obstructive disease  . HIATAL HERNIA 02/05/2009    Qualifier: Diagnosis of  By: Harriet Pho    . DECREASED HEARING 11/25/2008    Qualifier: Diagnosis of  By: Scherrie Gerlach    . COLONIC POLYPS, HX OF 08/09/2006    Qualifier: Diagnosis of  By: Scherrie Gerlach    . Hyperglycemia 11/10/2013  . Cataract     Past Surgical History  Procedure Laterality Date  . Ovarian cyst surgery  1968  . Appendectomy  1968    incendental  . Cataract surgery  2011    Bilateral  . Retinal detachment surgery  2011    left  . Hand tendon surgery  01/16/2014    Dr Burney Gauze  . Toenail excision Right     Dr Patrick Jupiter great toenail excision  . Tubal ligation  1979  . Trapezium resection Left 2016    left wrist   . Carpal tunnel release Left 2016  . Colonoscopy      Family History  Problem Relation Age of Onset  . Pernicious anemia Mother   . Cancer Father     lung/ bone  . Osteoporosis Father   . Hypothyroidism Father   . Colon cancer Neg Hx   . Colon polyps Neg Hx   . Rectal cancer Neg Hx   . Stomach cancer Neg Hx     Social History   Social History  . Marital Status: Married    Spouse Name: N/A  . Number of Children: N/A  . Years of Education: N/A   Social History Main Topics  . Smoking status: Former Smoker    Quit date: 01/17/2000  . Smokeless tobacco: Never Used  . Alcohol  Use: 1.2 - 1.8 oz/week    2-3 Standard drinks or equivalent per week     Comment: 3 times a week   . Drug Use: No  . Sexual Activity: Not Asked   Other Topics Concern  . 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  aspirin 81 MG tablet, Take by mouth. 1/2 tab qod , Disp: , Rfl:  .  Cholecalciferol (VITAMIN D) 1000 UNITS capsule, Take 1,000 Units by mouth daily. , Disp: , Rfl:  .  CRESTOR 20 MG tablet, TAKE 1 TABLET BY MOUTH EVERY DAY, Disp: 90 tablet, Rfl: 3 .  cyanocobalamin (,VITAMIN B-12,) 1000 MCG/ML injection, Inject 1 mL (1,000 mcg total) into the muscle every  30 (thirty) days., Disp: 1 mL, Rfl: 12 .  hyoscyamine (LEVSIN SL) 0.125 MG SL tablet, Place 2 tablets (0.25 mg total) under the tongue every 4 (four) hours as needed., Disp: 30 tablet, Rfl: 0 .  potassium chloride SA (K-DUR,KLOR-CON) 20 MEQ tablet, TAKE 1 TABLET BY MOUTH EVERY DAY, Disp: 90 tablet, Rfl: 0 .  predniSONE (DELTASONE) 20 MG tablet, Take three tablets once daily for three days, then two tablets once daily for three days, then one tablet daily for three days, Disp: , Rfl:  .  PROAIR HFA 108 (90 BASE) MCG/ACT inhaler, INHALE 2 PUFFS INTO THE LUNGS EVERY 6 HOURS AS NEEDED FOR WHEEZING, Disp: 8.5 g, Rfl: 5 .  valACYclovir (VALTREX) 1000 MG tablet, Take 1,000 mg by mouth., Disp: , Rfl:  .  valsartan-hydrochlorothiazide (DIOVAN-HCT) 320-25 MG per tablet, TAKE 1 TABLET BY MOUTH EVERY DAY, Disp: 90 tablet, Rfl: 0  EXAM:  Filed Vitals:   10/14/14 0807  BP: 136/72  Pulse: 80  Temp: 98.7 F (37.1 C)    There is no weight on file to calculate BMI.  GENERAL: vitals reviewed and listed above, alert, oriented, appears well hydrated and in no acute  distress  HEENT: atraumatic, conjunttiva clear, no obvious abnormalities on inspection of external nose and ears  NECK: no obvious masses on inspection  LUNGS: clear to auscultation bilaterally, no wheezes, rales or rhonchi, good air movement  CV: HRRR, no peripheral edema  MS: moves all extremities without noticeable abnormality  PSYCH: pleasant and cooperative, no obvious depression or anxiety  ASSESSMENT AND PLAN:  Discussed the following assessment and plan:  Shingles  Essential hypertension - Plan: Basic metabolic panel  Hyperglycemia - Plan: Hemoglobin A1c  -doing well, labs due -follow up as needed and as scheduled for CPE -Patient advised to return or notify a doctor immediately if symptoms worsen or persist or new concerns arise.  There are no Patient Instructions on file for this visit.   Colin Benton R.

## 2014-10-14 NOTE — Progress Notes (Signed)
Pre visit review using our clinic review tool, if applicable. No additional management support is needed unless otherwise documented below in the visit note. 

## 2014-11-20 ENCOUNTER — Other Ambulatory Visit: Payer: Self-pay | Admitting: Family Medicine

## 2014-11-22 ENCOUNTER — Other Ambulatory Visit: Payer: Self-pay | Admitting: Family Medicine

## 2014-12-01 NOTE — Telephone Encounter (Signed)
error 

## 2014-12-18 ENCOUNTER — Other Ambulatory Visit: Payer: Self-pay | Admitting: Internal Medicine

## 2014-12-18 NOTE — Telephone Encounter (Signed)
Albuterol not filled by Dr. Maudie Mercury in the past.  Will forward for authorization.

## 2014-12-22 ENCOUNTER — Encounter: Payer: Self-pay | Admitting: Podiatry

## 2014-12-22 ENCOUNTER — Ambulatory Visit (INDEPENDENT_AMBULATORY_CARE_PROVIDER_SITE_OTHER): Payer: Medicare Other | Admitting: Podiatry

## 2014-12-22 VITALS — BP 112/60 | HR 56 | Resp 16

## 2014-12-22 DIAGNOSIS — L600 Ingrowing nail: Secondary | ICD-10-CM | POA: Diagnosis not present

## 2014-12-22 DIAGNOSIS — M898X7 Other specified disorders of bone, ankle and foot: Secondary | ICD-10-CM

## 2014-12-22 DIAGNOSIS — M2570 Osteophyte, unspecified joint: Secondary | ICD-10-CM

## 2014-12-22 NOTE — Progress Notes (Signed)
She presents today concerned that her toenail may be growing back on her right great toe. She states it is mildly tender with shoe gear.  Objective: Vital signs are stable she is alert and oriented 3 pulses are palpable right. The toenail appears to be normal there is no regrowth of the nail plate itself. She does have a distal exostosis which is mildly tender on palpation. I explained this to her today and suggested a surgical procedure to remove the tip of that spur. She will consider this.  Assessment: Subungual exostosis hallux right.  Plan: No regrowth of the nail but we did discuss the possible need for exostectomy and I will follow-up with her when she makes a decision.

## 2015-01-27 DIAGNOSIS — R69 Illness, unspecified: Secondary | ICD-10-CM | POA: Diagnosis not present

## 2015-02-21 ENCOUNTER — Other Ambulatory Visit: Payer: Self-pay | Admitting: Family Medicine

## 2015-02-22 ENCOUNTER — Other Ambulatory Visit: Payer: Self-pay | Admitting: Family Medicine

## 2015-03-01 ENCOUNTER — Ambulatory Visit (HOSPITAL_COMMUNITY)
Admission: RE | Admit: 2015-03-01 | Discharge: 2015-03-01 | Disposition: A | Discharge status: Home / Self Care | Payer: Medicare HMO | Source: Ambulatory Visit | Attending: Cardiology | Admitting: Cardiology

## 2015-03-01 ENCOUNTER — Other Ambulatory Visit (HOSPITAL_COMMUNITY): Payer: Self-pay | Admitting: Physician Assistant

## 2015-03-01 DIAGNOSIS — E785 Hyperlipidemia, unspecified: Secondary | ICD-10-CM | POA: Insufficient documentation

## 2015-03-01 DIAGNOSIS — M79605 Pain in left leg: Secondary | ICD-10-CM | POA: Insufficient documentation

## 2015-03-01 DIAGNOSIS — I10 Essential (primary) hypertension: Secondary | ICD-10-CM | POA: Insufficient documentation

## 2015-03-01 DIAGNOSIS — M25562 Pain in left knee: Secondary | ICD-10-CM | POA: Diagnosis not present

## 2015-03-01 DIAGNOSIS — M7989 Other specified soft tissue disorders: Secondary | ICD-10-CM

## 2015-03-03 ENCOUNTER — Ambulatory Visit (INDEPENDENT_AMBULATORY_CARE_PROVIDER_SITE_OTHER): Payer: Medicare HMO | Admitting: Family Medicine

## 2015-03-03 ENCOUNTER — Encounter: Payer: Self-pay | Admitting: Family Medicine

## 2015-03-03 VITALS — BP 138/80 | HR 75 | Temp 98.3°F | Ht 62.75 in | Wt 178.9 lb

## 2015-03-03 DIAGNOSIS — E538 Deficiency of other specified B group vitamins: Secondary | ICD-10-CM

## 2015-03-03 DIAGNOSIS — E785 Hyperlipidemia, unspecified: Secondary | ICD-10-CM | POA: Diagnosis not present

## 2015-03-03 DIAGNOSIS — I10 Essential (primary) hypertension: Secondary | ICD-10-CM

## 2015-03-03 DIAGNOSIS — Z0000 Encounter for general adult medical examination without abnormal findings: Secondary | ICD-10-CM

## 2015-03-03 DIAGNOSIS — R0600 Dyspnea, unspecified: Secondary | ICD-10-CM

## 2015-03-03 DIAGNOSIS — R739 Hyperglycemia, unspecified: Secondary | ICD-10-CM | POA: Diagnosis not present

## 2015-03-03 DIAGNOSIS — K589 Irritable bowel syndrome without diarrhea: Secondary | ICD-10-CM

## 2015-03-03 DIAGNOSIS — M25569 Pain in unspecified knee: Secondary | ICD-10-CM

## 2015-03-03 MED ORDER — HYOSCYAMINE SULFATE 0.125 MG SL SUBL: 0.2500 mg | SUBLINGUAL_TABLET | SUBLINGUAL | Status: DC | PRN

## 2015-03-03 MED ORDER — CYANOCOBALAMIN 1000 MCG/ML IJ SOLN: 1000.0000 ug | INTRAMUSCULAR | Status: DC

## 2015-03-03 NOTE — Progress Notes (Signed)
2. These  Family knee but it isn't Medicare Annual Preventive Care Visit  (initial annual wellness or annual wellness exam)  Concerns and/or follow up today:  Dyspnea: -Started over 10 years ago -Reports extensive evaluation with prior PCP including a stress test and pulmonary function testing -Uses albuterol on occasion for shortness of breath and wheezing sensation -Denies diagnosis of asthma or COPD -Feels has gradually worsened over the last 3-4 years -Denies chest pain, worsening dyspnea on exertion, palpitations, swelling, cough, weight loss   IBS: -used to see Dr. Deatra Ina - but he has retired -reports uses levsin rarely and requests refill -stable  HTN: -chronic -meds: asa, valsartan-hctz 320-25 -denies: Chest pain, swelling, headache, palpitations  Hyperglycemia: -in prediabetes range -treated with diet and exercise -denies: Polyuria, polydipsia, worsening vision  HLD/Obesity: -meds: crestor 20mg , only taking 10 mg daily -diet and exercise: No regular exercise, diet is so-so -denies: leg cramps  B12 deficiency: -Uses B12 injections, request refills -reported hx pernicious anemia - all cbcs in chart back to 6 years ago nromal  Right knee pain: -Seeing [facility] -Has MRI in a few days  ROS: negative for report of fevers, unintentional weight loss, vision changes, vision loss, hearing loss or change, chest pain, sob, hemoptysis, melena, hematochezia, hematuria, genital discharge or lesions, falls, bleeding or bruising, loc, thoughts of suicide or self harm, memory loss  1.) Patient-completed health risk assessment  - completed and reviewed, see scanned documentation  2.) Review of Medical History: -PMH, PSH, Family History and current specialty and care providers reviewed and updated and listed below  - see scanned in document in chart and below  Past Medical History  Diagnosis Date  . Hyperlipidemia   . Hypertension   . Osteoporosis     was  osteopenia-last bone scan all clear   . Colon polyps     history of  . Pernicious anemia   . Elevated LFTs     obstructive disease  . HIATAL HERNIA 02/05/2009    Qualifier: Diagnosis of  By: Harriet Pho    . DECREASED HEARING 11/25/2008    Qualifier: Diagnosis of  By: Scherrie Gerlach    . COLONIC POLYPS, HX OF 08/09/2006    Qualifier: Diagnosis of  By: Scherrie Gerlach    . Hyperglycemia 11/10/2013  . Cataract     Past Surgical History  Procedure Laterality Date  . Ovarian cyst surgery  1968  . Appendectomy  1968    incendental  . Cataract surgery  2011    Bilateral  . Retinal detachment surgery  2011    left  . Hand tendon surgery  01/16/2014    Dr Burney Gauze  . Toenail excision Right     Dr Patrick Jupiter great toenail excision  . Tubal ligation  1979  . Trapezium resection Left 2016    left wrist   . Carpal tunnel release Left 2016  . Colonoscopy      Social History   Social History  . Marital Status: Married    Spouse Name: N/A  . Number of Children: N/A  . Years of Education: N/A   Occupational History  . Not on file.   Social History Main Topics  . Smoking status: Former Smoker    Quit date: 01/17/2000  . Smokeless tobacco: Never Used  . Alcohol Use: 1.2 - 1.8 oz/week    2-3 Standard drinks or equivalent per week     Comment: 3 times a week   . Drug Use:  No  . Sexual Activity: Not on file   Other Topics Concern  . 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 Pernicious anemia Mother   . Cancer Father     lung/ bone  . Osteoporosis Father   . Hypothyroidism Father   . Colon cancer Neg Hx   . Colon polyps Neg Hx   . Rectal cancer Neg Hx   . Stomach cancer  Neg Hx     Current Outpatient Prescriptions on File Prior to Visit  Medication Sig Dispense Refill  . aspirin 81 MG tablet Take by mouth. 1/2 tab qod     . Cholecalciferol (VITAMIN D) 1000 UNITS capsule Take 1,000 Units by mouth daily.     . potassium chloride SA (K-DUR,KLOR-CON) 20 MEQ tablet TAKE 1 TABLET BY MOUTH EVERY DAY 90 tablet 1  . PROAIR HFA 108 (90 BASE) MCG/ACT inhaler INHALE 2 PUFFS INTO THE LUNGS EVERY 6 HOURS AS NEEDED FOR WHEEZING 8.5 g 0  . rosuvastatin (CRESTOR) 20 MG tablet TAKE 1 TABLET BY MOUTH EVERY DAY 90 tablet 0  . valsartan-hydrochlorothiazide (DIOVAN-HCT) 320-25 MG tablet TAKE 1 TABLET BY MOUTH EVERY DAY 90 tablet 1   No current facility-administered medications on file prior to visit.     3.) Review of functional ability and level of safety:  Any difficulty hearing?  NO  History of falling?  NO  Any trouble with IADLs - using a phone, using transportation, grocery shopping, preparing meals, doing housework, doing laundry, taking medications and managing money? NO  Advance Directives? Yes, living will and healthcare power of attorney  See summary of recommendations in Patient Instructions below.  4.) Physical Exam Filed Vitals:   03/03/15 0837  BP: 138/80  Pulse: 75  Temp: 98.3 F (36.8 C)   Estimated body mass index is 31.94 kg/(m^2) as calculated from the following:   Height as of this encounter: 5' 2.75" (1.594 m).   Weight as of this encounter: 178 lb 14.4 oz (81.149 kg).  EKG (optional): deferred  General: alert, appear well hydrated and in no acute distress  HEENT: visual acuity grossly intact  CV: HRRR, no lower extremity edema, no JVD  Lungs: CTA bilaterally, normal O2 sats on room air, no signs of respiratory distress, no wheezing or rhonchi  Psych: pleasant and cooperative, no obvious depression or anxiety  Cognitive function grossly intact  See patient instructions for recommendations.  Education and counseling  regarding the above review of health provided with a plan for the following: -see scanned patient completed form for further details -fall prevention strategies discussed  -healthy lifestyle discussed -importance and resources for completing advanced directives discussed -see patient instructions below for any other recommendations provided  4)The following written screening schedule of preventive measures were reviewed with assessment and plan made per below, orders and patient instructions:      Alcohol screening done     Obesity Screening and counseling done     STI screening (Hep C if born [birth year]) declined     Tobacco Screening done done  Pneumococcal (PPSV23 -one dose after 64, one before if risk factors), influenza yearly and hepatitis B vaccines (if high risk - end stage renal disease, IV drugs, homosexual men, live in home for mentally retarded, hemophilia receiving factors) ASSESSMENT/PLAN: done      Screening mammograph (yearly if >40) ASSESSMENT/PLAN: Sees Dr. Matthew Saras, gynecologist, seeing next week      Screening Pap smear/pelvic exam (q2 years) ASSESSMENT/PLAN: Sees gynecologist      Prostate cancer screening ASSESSMENT/PLAN: n/a, declined      Colorectal cancer screening (FOBT yearly or flex sig q4y or colonoscopy q10y or barium enema q4y) ASSESSMENT/PLAN: utd      Diabetes outpatient self-management training services ASSESSMENT/PLAN: utd or done      Bone mass measurements(covered q2y if indicated - estrogen def, osteoporosis, hyperparathyroid, vertebral abnormalities, osteoporosis or steroids) ASSESSMENT/PLAN: Reports prior treatment with Fosamax, reports last bone density normal, reports does bone density testing and follow-up with gynecologist      Screening for glaucoma(q1y if high risk - diabetes, FH, AA and > 8 or hispanic and > 65) ASSESSMENT/PLAN: Sees ophthalmologist yearly per her report      Medical nutritional therapy for individuals with  diabetes or renal disease ASSESSMENT/PLAN: see orders      Cardiovascular screening blood tests (lipids q5y) ASSESSMENT/PLAN: see orders and labs      Diabetes screening tests ASSESSMENT/PLAN: see orders and labs   7.) Summary:  Medicare annual wellness visit, subsequent -risk factors and conditions per above assessment were discussed and treatment, recommendations and referrals were offered per documentation above and orders and patient instructions.  Hyperlipemia - Plan: Lipid panel -Lifestyle recommendations -Continue current treatment  Essential hypertension - Plan: Basic metabolic panel -Continue current treatment -Labs   Hyperglycemia - Plan: Hemoglobin A1C  B12 deficiency - Plan: CBC (no diff) -refills provided  Knee pain, unspecified laterality -seeing ortho  Dyspnea - Plan: Pulmonary function test, DG Chest 2 View -She initially declined any evaluation for this, and reported prior evaluation with her last PCP -Finally agreed to chest x-ray and pulmonary function testing -Advised follow-up if worsening or symptoms persist or new symptoms arise  IBS (irritable bowel syndrome) - Plan: hyoscyamine (LEVSIN SL) 0.125 MG SL tablet  -stable   Patient Instructions  BEFORE YOU LEAVE: -labs -xray sheet -schedule follow up in about 4-6 months  -We have ordered labs or studies at this visit. It can take up to 1-2 weeks for results and processing. We will contact you with instructions IF your results are abnormal. Normal results will be released to your [facility]. If you have not heard from [place] or can not find your results in [facility] in 2 weeks please contact our office.  We recommend the following healthy lifestyle measures: - eat a healthy whole foods diet consisting of regular small meals composed of vegetables, fruits, beans, nuts, seeds, healthy meats such as white chicken and fish and whole grains.  - avoid sweets, white starchy foods, fried foods, fast food, processed  foods, sodas, red meet and other fattening foods.  - get a least 150-300 minutes of aerobic exercise per week.   -We placed a referral for you as discussed for the breathing test. It usually takes about 1-2 weeks to process and schedule this referral. If you have not heard from [place] regarding this appointment in 2 weeks please contact our office. Please also get the chest xray. Follow up promptly if any worsening or new symptoms.  -Please get your mammogram and gynecology exam as  planned next week and have Dr. Matthew Saras send reports to [place]  -Please bring a copy of your advance directives to our office

## 2015-03-03 NOTE — Patient Instructions (Signed)
BEFORE YOU LEAVE: -labs -xray sheet -schedule follow up in about 4-6 months  -We have ordered labs or studies at this visit. It can take up to 1-2 weeks for results and processing. We will contact you with instructions IF your results are abnormal. Normal results will be released to your [facility]. If you have not heard from [place] or can not find your results in [facility] in 2 weeks please contact our office.  We recommend the following healthy lifestyle measures: - eat a healthy whole foods diet consisting of regular small meals composed of vegetables, fruits, beans, nuts, seeds, healthy meats such as white chicken and fish and whole grains.  - avoid sweets, white starchy foods, fried foods, fast food, processed foods, sodas, red meet and other fattening foods.  - get a least 150-300 minutes of aerobic exercise per week.   -We placed a referral for you as discussed for the breathing test. It usually takes about 1-2 weeks to process and schedule this referral. If you have not heard from [place] regarding this appointment in 2 weeks please contact our office. Please also get the chest xray. Follow up promptly if any worsening or new symptoms.  -Please get your mammogram and gynecology exam as planned next week and have Dr. Matthew Saras send reports to [place]  -Please bring a copy of your advance directives to our office

## 2015-03-03 NOTE — Progress Notes (Signed)
Pre visit review using our clinic review tool, if applicable. No additional management support is needed unless otherwise documented below in the visit note. 

## 2015-03-04 ENCOUNTER — Other Ambulatory Visit: Payer: Medicare HMO

## 2015-03-04 LAB — CBC
HCT: 43.3 % (ref 36.0–46.0)
Hemoglobin: 14.6 g/dL (ref 12.0–15.0)
MCHC: 33.6 g/dL (ref 30.0–36.0)
MCV: 82.6 fl (ref 78.0–100.0)
PLATELETS: 238 10*3/uL (ref 150.0–400.0)
RBC: 5.24 Mil/uL — AB (ref 3.87–5.11)
RDW: 12.9 % (ref 11.5–15.5)
WBC: 6.6 10*3/uL (ref 4.0–10.5)

## 2015-03-04 LAB — BASIC METABOLIC PANEL
BUN: 22 mg/dL (ref 6–23)
CO2: 32 meq/L (ref 19–32)
Calcium: 9.3 mg/dL (ref 8.4–10.5)
Chloride: 102 mEq/L (ref 96–112)
Creatinine, Ser: 0.78 mg/dL (ref 0.40–1.20)
GFR: 77.26 mL/min (ref >60.00)
Glucose, Bld: 121 mg/dL — ABNORMAL HIGH (ref 70–99)
Potassium: 4.1 mEq/L (ref 3.5–5.1)
SODIUM: 140 meq/L (ref 135–145)

## 2015-03-04 LAB — HEMOGLOBIN A1C: Hgb A1c MFr Bld: 6.3 % (ref 4.6–6.5)

## 2015-03-04 LAB — LIPID PANEL
CHOL/HDL RATIO: 3
Cholesterol: 147 mg/dL (ref 0–200)
HDL: 49.5 mg/dL (ref >39.00)
LDL CALC: 70 mg/dL (ref 0–99)
NonHDL: 97.11
TRIGLYCERIDES: 138 mg/dL (ref 0.0–149.0)
VLDL: 27.6 mg/dL (ref 0.0–40.0)

## 2015-03-08 DIAGNOSIS — Z01419 Encounter for gynecological examination (general) (routine) without abnormal findings: Secondary | ICD-10-CM | POA: Diagnosis not present

## 2015-03-08 DIAGNOSIS — M25562 Pain in left knee: Secondary | ICD-10-CM | POA: Diagnosis not present

## 2015-03-08 DIAGNOSIS — Z1231 Encounter for screening mammogram for malignant neoplasm of breast: Secondary | ICD-10-CM | POA: Diagnosis not present

## 2015-03-11 DIAGNOSIS — Z13820 Encounter for screening for osteoporosis: Secondary | ICD-10-CM | POA: Diagnosis not present

## 2015-03-11 DIAGNOSIS — N958 Other specified menopausal and perimenopausal disorders: Secondary | ICD-10-CM | POA: Diagnosis not present

## 2015-03-12 DIAGNOSIS — M23304 Other meniscus derangements, unspecified medial meniscus, left knee: Secondary | ICD-10-CM | POA: Diagnosis not present

## 2015-03-12 DIAGNOSIS — S83242D Other tear of medial meniscus, current injury, left knee, subsequent encounter: Secondary | ICD-10-CM | POA: Diagnosis not present

## 2015-03-12 DIAGNOSIS — M2392 Unspecified internal derangement of left knee: Secondary | ICD-10-CM | POA: Diagnosis not present

## 2015-03-29 DIAGNOSIS — H04123 Dry eye syndrome of bilateral lacrimal glands: Secondary | ICD-10-CM | POA: Diagnosis not present

## 2015-03-29 DIAGNOSIS — H43813 Vitreous degeneration, bilateral: Secondary | ICD-10-CM | POA: Diagnosis not present

## 2015-03-29 DIAGNOSIS — H40012 Open angle with borderline findings, low risk, left eye: Secondary | ICD-10-CM | POA: Diagnosis not present

## 2015-04-29 DIAGNOSIS — I788 Other diseases of capillaries: Secondary | ICD-10-CM | POA: Diagnosis not present

## 2015-04-29 DIAGNOSIS — D2272 Melanocytic nevi of left lower limb, including hip: Secondary | ICD-10-CM | POA: Diagnosis not present

## 2015-04-29 DIAGNOSIS — L820 Inflamed seborrheic keratosis: Secondary | ICD-10-CM | POA: Diagnosis not present

## 2015-04-29 DIAGNOSIS — D1801 Hemangioma of skin and subcutaneous tissue: Secondary | ICD-10-CM | POA: Diagnosis not present

## 2015-04-29 DIAGNOSIS — L821 Other seborrheic keratosis: Secondary | ICD-10-CM | POA: Diagnosis not present

## 2015-05-19 DIAGNOSIS — M2392 Unspecified internal derangement of left knee: Secondary | ICD-10-CM | POA: Diagnosis not present

## 2015-05-19 DIAGNOSIS — M23304 Other meniscus derangements, unspecified medial meniscus, left knee: Secondary | ICD-10-CM | POA: Diagnosis not present

## 2015-05-24 ENCOUNTER — Encounter: Payer: Self-pay | Admitting: Family Medicine

## 2015-05-24 ENCOUNTER — Encounter: Payer: Self-pay | Admitting: *Deleted

## 2015-05-29 ENCOUNTER — Other Ambulatory Visit: Payer: Self-pay | Admitting: Family Medicine

## 2015-07-14 DIAGNOSIS — S83242D Other tear of medial meniscus, current injury, left knee, subsequent encounter: Secondary | ICD-10-CM | POA: Diagnosis not present

## 2015-07-14 DIAGNOSIS — M23304 Other meniscus derangements, unspecified medial meniscus, left knee: Secondary | ICD-10-CM | POA: Diagnosis not present

## 2015-07-14 DIAGNOSIS — M2392 Unspecified internal derangement of left knee: Secondary | ICD-10-CM | POA: Diagnosis not present

## 2015-07-14 DIAGNOSIS — M1712 Unilateral primary osteoarthritis, left knee: Secondary | ICD-10-CM | POA: Diagnosis not present

## 2015-08-26 ENCOUNTER — Other Ambulatory Visit: Payer: Self-pay | Admitting: Family Medicine

## 2015-08-26 DIAGNOSIS — K589 Irritable bowel syndrome without diarrhea: Secondary | ICD-10-CM

## 2015-08-30 NOTE — Progress Notes (Signed)
HPI:  Follow up:  IBS: -used to see Dr. Deatra Ina - but he has retired -reports uses levsin rarely  -rare (1x per month) LLQ pain, lasts a few hours and resolves spontaneously, for years - s/p eval with gyn and GI with [place], endo, colonosocpy -stable -no weight loss, bulge, fevers, malaise, change in bowels  Dyspnea: -Started over 10 years ago -Reports extensive evaluation with prior PCP including a stress test and pulmonary function testing remotely -Uses albuterol on occasion for shortness of breath and wheezing sensation -Denies diagnosis of asthma or COPD -cxr and PFTs advised 2/12017; she did neither - reports not called and has decided doesn't need to do -Denies chest pain, worsening dyspnea on exertion, palpitations, swelling, cough, weight loss  HTN: -chronic -meds: asa, valsartan-hctz 320-25 -denies: Chest pain, swelling, headache, palpitations  Hyperglycemia: -in prediabetes range -treated with diet and exercise -denies: Polyuria, polydipsia, worsening vision  HLD/Obesity: -meds: crestor 20mg , only taking 10 mg daily -diet and exercise: No regular exercise, diet is so-so -denies: leg cramps  B12 deficiency: -Uses B12 injections, request refills -reported hx pernicious anemia - all cbcs in chart back to 6 years ago nromal  Right knee pain: -Seeing [facility]    ROS: See pertinent positives and negatives per HPI.  Past Medical History:  Diagnosis Date  . Cataract   . Colon polyps    history of  . COLONIC POLYPS, HX OF 08/09/2006   Qualifier: Diagnosis of  By: Scherrie Gerlach    . DECREASED HEARING 11/25/2008   Qualifier: Diagnosis of  By: Scherrie Gerlach    . Elevated LFTs    obstructive disease  . HIATAL HERNIA 02/05/2009   Qualifier: Diagnosis of  By: Harriet Pho    . Hyperglycemia 11/10/2013  . Hyperlipidemia   . Hypertension   . Osteoporosis    was osteopenia-last bone scan all clear   . Pernicious anemia     Past  Surgical History:  Procedure Laterality Date  . [place] RELEASE Left 2016  . cataract surgery  2011   Bilateral  . COLONOSCOPY    . HAND TENDON SURGERY  01/16/2014   Dr Burney Gauze  . OVARIAN CYST SURGERY  1968  . RETINAL DETACHMENT SURGERY  2011   left  . TOENAIL EXCISION Right    Dr Patrick Jupiter great toenail excision  . TRAPEZIUM RESECTION Left 2016   left wrist   . TUBAL LIGATION  1979    Family History  Problem Relation Age of Onset  . Pernicious anemia Mother   . Cancer Father     lung/ bone  . Osteoporosis Father   . Hypothyroidism Father   . Colon cancer Neg Hx   . Colon polyps Neg Hx   . Rectal cancer Neg Hx   . Stomach cancer Neg Hx     Social History   Social History  . Marital status: Married    Spouse name: N/A  . Number of children: N/A  . Years of education: N/A   Social History Main Topics  . Smoking status: Former Smoker    Quit date: 01/17/2000  . Smokeless tobacco: Never Used  . Alcohol use 1.2 - 1.8 oz/week    2 - 3 Standard drinks or equivalent per week     Comment: 3 times a week   . Drug use: No  . Sexual activity: Not Asked   Other Topics Concern  . 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  aspirin 81 MG tablet, Take by mouth. 1/2 tab qod , Disp: , Rfl:  .  Cholecalciferol (VITAMIN D) 1000 UNITS capsule, Take 1,000 Units by mouth daily. , Disp: , Rfl:  .  cyanocobalamin (,VITAMIN B-12,) 1000 MCG/ML injection, Inject 1 mL (1,000 mcg total) into the muscle every 30 (thirty) days., Disp: 1 mL, Rfl: 12 .  hyoscyamine (LEVSIN SL) 0.125 MG SL tablet, DISSOLVE 2 TABLETS(0.25 MG) UNDER THE TONGUE EVERY 4 HOURS AS NEEDED, Disp: 30  tablet, Rfl: 0 .  potassium chloride SA (K-DUR,KLOR-CON) 20 MEQ tablet, TAKE 1 TABLET BY MOUTH EVERY DAY, Disp: 90 tablet, Rfl: 0 .  PROAIR HFA 108 (90 Base) MCG/ACT inhaler, INHALE 2 PUFFS EVERY 6 HOURS AS NEEDED FOR WHEEZING, Disp: 8.5 g, Rfl: 0 .  rosuvastatin (CRESTOR) 20 MG tablet, TAKE 1 TABLET BY MOUTH EVERY DAY, Disp: 90 tablet, Rfl: 0 .  valsartan-hydrochlorothiazide (DIOVAN-HCT) 320-25 MG tablet, TAKE 1 TABLET BY MOUTH EVERY DAY, Disp: 90 tablet, Rfl: 1  EXAM:  Vitals:   08/31/15 0903  BP: 100/70  Pulse: 82  Temp: 98.7 F (37.1 C)    Body mass index is 31.6 kg/m.  GENERAL: vitals reviewed and listed above, alert, oriented, appears well hydrated and in no acute distress  HEENT: atraumatic, conjunttiva clear, no obvious abnormalities on inspection of external nose and ears  NECK: no obvious masses on inspection  LUNGS: clear to auscultation bilaterally, no wheezes, rales or rhonchi, good air movement  CV: HRRR, no peripheral edema  MS: moves all extremities without noticeable abnormality  ABD: BS+, soft, NTTP, no appreciable hernai  PSYCH: pleasant and cooperative, no obvious depression or anxiety  ASSESSMENT AND PLAN:  Discussed the following assessment and plan:  LLQ pain  Dyspnea  Essential hypertension  Hyperglycemia  Hyperlipemia  -chronic problems stable -offered CT scan for LLQ pain as she seemed concerned about it despite chronicity and extensive prior eval with GI and gyn - she declined and opted to observe, suspect IBS symptom, advised to call if changes mind about CT -she declined the cxr or PFTs advised at last visit -lifestyle recs -labs next visit -report mamo done with gyn, [facility] and normal -Patient advised to return or notify a doctor immediately if symptoms worsen or persist or new concerns arise.  Patient Instructions  BEFORE YOU LEAVE: -follow up: in 4-6 months  We recommend the following healthy lifestyle: 1)  Small portions - eat off of salad plate instead of dinner plate 2) Eat a healthy clean diet with avoidance of (less then 1 serving per week) processed foods, sweetened drinks, white starches, red meat, fast foods and sweets and consisting of: * 5-9 servings per day of fresh or frozen fruits and vegetables (not corn or potatoes, not dried or canned) *nuts and seeds, beans *olives and olive oil *small portions of lean meats such as fish and white chicken  *small portions of whole grains 3)Get at least 150 minutes of sweaty aerobic exercise per week 4)reduce stress - counseling, meditation, relaxation to balance other aspects of your  life     Colin Benton R., DO

## 2015-08-31 ENCOUNTER — Ambulatory Visit: Payer: Medicare HMO | Admitting: Family Medicine

## 2015-08-31 ENCOUNTER — Ambulatory Visit (INDEPENDENT_AMBULATORY_CARE_PROVIDER_SITE_OTHER): Payer: Medicare HMO | Admitting: Family Medicine

## 2015-08-31 ENCOUNTER — Encounter: Payer: Self-pay | Admitting: Family Medicine

## 2015-08-31 VITALS — BP 100/70 | HR 82 | Temp 98.7°F | Ht 62.75 in | Wt 177.0 lb

## 2015-08-31 DIAGNOSIS — R739 Hyperglycemia, unspecified: Secondary | ICD-10-CM

## 2015-08-31 DIAGNOSIS — I10 Essential (primary) hypertension: Secondary | ICD-10-CM

## 2015-08-31 DIAGNOSIS — E785 Hyperlipidemia, unspecified: Secondary | ICD-10-CM

## 2015-08-31 DIAGNOSIS — R1032 Left lower quadrant pain: Secondary | ICD-10-CM

## 2015-08-31 DIAGNOSIS — R0600 Dyspnea, unspecified: Secondary | ICD-10-CM | POA: Diagnosis not present

## 2015-08-31 NOTE — Progress Notes (Signed)
Pre visit review using our clinic review tool, if applicable. No additional management support is needed unless otherwise documented below in the visit note. 

## 2015-08-31 NOTE — Patient Instructions (Signed)
BEFORE YOU LEAVE: -follow up: in 4-6 months  We recommend the following healthy lifestyle: 1) Small portions - eat off of salad plate instead of dinner plate 2) Eat a healthy clean diet with avoidance of (less then 1 serving per week) processed foods, sweetened drinks, white starches, red meat, fast foods and sweets and consisting of: * 5-9 servings per day of fresh or frozen fruits and vegetables (not corn or potatoes, not dried or canned) *nuts and seeds, beans *olives and olive oil *small portions of lean meats such as fish and white chicken  *small portions of whole grains 3)Get at least 150 minutes of sweaty aerobic exercise per week 4)reduce stress - counseling, meditation, relaxation to balance other aspects of your life

## 2015-09-03 ENCOUNTER — Ambulatory Visit: Payer: Medicare HMO | Admitting: *Deleted

## 2015-09-03 ENCOUNTER — Telehealth: Payer: Self-pay | Admitting: Family Medicine

## 2015-09-03 NOTE — Telephone Encounter (Signed)
Please call patient when high dose flu shots are ready to be administered.

## 2015-09-07 DIAGNOSIS — E7800 Pure hypercholesterolemia, unspecified: Secondary | ICD-10-CM | POA: Diagnosis not present

## 2015-09-07 DIAGNOSIS — Z0000 Encounter for general adult medical examination without abnormal findings: Secondary | ICD-10-CM | POA: Diagnosis not present

## 2015-09-07 DIAGNOSIS — I10 Essential (primary) hypertension: Secondary | ICD-10-CM | POA: Diagnosis not present

## 2015-09-08 DIAGNOSIS — S83242D Other tear of medial meniscus, current injury, left knee, subsequent encounter: Secondary | ICD-10-CM | POA: Diagnosis not present

## 2015-09-08 DIAGNOSIS — M1712 Unilateral primary osteoarthritis, left knee: Secondary | ICD-10-CM | POA: Diagnosis not present

## 2015-09-10 NOTE — Telephone Encounter (Signed)
Per [place] appt scheduled for 8/29.

## 2015-09-14 ENCOUNTER — Ambulatory Visit (INDEPENDENT_AMBULATORY_CARE_PROVIDER_SITE_OTHER): Payer: Medicare HMO | Admitting: *Deleted

## 2015-09-14 DIAGNOSIS — L821 Other seborrheic keratosis: Secondary | ICD-10-CM | POA: Diagnosis not present

## 2015-09-14 DIAGNOSIS — L570 Actinic keratosis: Secondary | ICD-10-CM | POA: Diagnosis not present

## 2015-09-14 DIAGNOSIS — Z23 Encounter for immunization: Secondary | ICD-10-CM | POA: Diagnosis not present

## 2015-09-14 DIAGNOSIS — L814 Other melanin hyperpigmentation: Secondary | ICD-10-CM | POA: Diagnosis not present

## 2015-11-08 ENCOUNTER — Other Ambulatory Visit: Payer: Self-pay | Admitting: Family Medicine

## 2015-11-28 ENCOUNTER — Other Ambulatory Visit: Payer: Self-pay | Admitting: Family Medicine

## 2015-12-21 ENCOUNTER — Other Ambulatory Visit: Payer: Self-pay | Admitting: Family Medicine

## 2016-01-12 DIAGNOSIS — R0689 Other abnormalities of breathing: Secondary | ICD-10-CM | POA: Diagnosis not present

## 2016-01-12 DIAGNOSIS — J101 Influenza due to other identified influenza virus with other respiratory manifestations: Secondary | ICD-10-CM | POA: Diagnosis not present

## 2016-01-14 ENCOUNTER — Ambulatory Visit (INDEPENDENT_AMBULATORY_CARE_PROVIDER_SITE_OTHER): Payer: Medicare HMO | Admitting: Family Medicine

## 2016-01-14 ENCOUNTER — Encounter: Payer: Self-pay | Admitting: Family Medicine

## 2016-01-14 ENCOUNTER — Emergency Department (HOSPITAL_COMMUNITY): Payer: Medicare HMO

## 2016-01-14 ENCOUNTER — Observation Stay (HOSPITAL_COMMUNITY)
Admission: EM | Admit: 2016-01-14 | Discharge: 2016-01-16 | Disposition: A | Discharge status: Home / Self Care | Payer: Medicare HMO | Attending: Internal Medicine | Admitting: Internal Medicine

## 2016-01-14 ENCOUNTER — Encounter (HOSPITAL_COMMUNITY): Payer: Self-pay | Admitting: *Deleted

## 2016-01-14 VITALS — BP 120/50 | HR 67 | Temp 98.4°F | Ht 62.75 in | Wt 174.5 lb

## 2016-01-14 DIAGNOSIS — Z7982 Long term (current) use of aspirin: Secondary | ICD-10-CM | POA: Insufficient documentation

## 2016-01-14 DIAGNOSIS — I700 Atherosclerosis of aorta: Secondary | ICD-10-CM | POA: Insufficient documentation

## 2016-01-14 DIAGNOSIS — Z888 Allergy status to other drugs, medicaments and biological substances status: Secondary | ICD-10-CM | POA: Insufficient documentation

## 2016-01-14 DIAGNOSIS — E785 Hyperlipidemia, unspecified: Secondary | ICD-10-CM | POA: Insufficient documentation

## 2016-01-14 DIAGNOSIS — J9601 Acute respiratory failure with hypoxia: Secondary | ICD-10-CM | POA: Diagnosis not present

## 2016-01-14 DIAGNOSIS — E871 Hypo-osmolality and hyponatremia: Secondary | ICD-10-CM | POA: Diagnosis not present

## 2016-01-14 DIAGNOSIS — I10 Essential (primary) hypertension: Secondary | ICD-10-CM | POA: Diagnosis present

## 2016-01-14 DIAGNOSIS — J208 Acute bronchitis due to other specified organisms: Secondary | ICD-10-CM | POA: Diagnosis not present

## 2016-01-14 DIAGNOSIS — M810 Age-related osteoporosis without current pathological fracture: Secondary | ICD-10-CM | POA: Insufficient documentation

## 2016-01-14 DIAGNOSIS — J101 Influenza due to other identified influenza virus with other respiratory manifestations: Secondary | ICD-10-CM

## 2016-01-14 DIAGNOSIS — J1000 Influenza due to other identified influenza virus with unspecified type of pneumonia: Secondary | ICD-10-CM | POA: Diagnosis not present

## 2016-01-14 DIAGNOSIS — Z87891 Personal history of nicotine dependence: Secondary | ICD-10-CM | POA: Insufficient documentation

## 2016-01-14 DIAGNOSIS — R05 Cough: Secondary | ICD-10-CM | POA: Diagnosis not present

## 2016-01-14 DIAGNOSIS — R0602 Shortness of breath: Secondary | ICD-10-CM

## 2016-01-14 DIAGNOSIS — Z885 Allergy status to narcotic agent status: Secondary | ICD-10-CM | POA: Insufficient documentation

## 2016-01-14 DIAGNOSIS — E6609 Other obesity due to excess calories: Secondary | ICD-10-CM | POA: Diagnosis not present

## 2016-01-14 DIAGNOSIS — Z86010 Personal history of colonic polyps: Secondary | ICD-10-CM | POA: Diagnosis not present

## 2016-01-14 DIAGNOSIS — J40 Bronchitis, not specified as acute or chronic: Secondary | ICD-10-CM | POA: Diagnosis not present

## 2016-01-14 DIAGNOSIS — E860 Dehydration: Secondary | ICD-10-CM | POA: Insufficient documentation

## 2016-01-14 DIAGNOSIS — G4730 Sleep apnea, unspecified: Secondary | ICD-10-CM | POA: Diagnosis present

## 2016-01-14 DIAGNOSIS — Z6830 Body mass index (BMI) 30.0-30.9, adult: Secondary | ICD-10-CM | POA: Insufficient documentation

## 2016-01-14 DIAGNOSIS — D510 Vitamin B12 deficiency anemia due to intrinsic factor deficiency: Secondary | ICD-10-CM | POA: Insufficient documentation

## 2016-01-14 DIAGNOSIS — E876 Hypokalemia: Secondary | ICD-10-CM | POA: Insufficient documentation

## 2016-01-14 DIAGNOSIS — R0902 Hypoxemia: Secondary | ICD-10-CM | POA: Diagnosis not present

## 2016-01-14 DIAGNOSIS — B349 Viral infection, unspecified: Secondary | ICD-10-CM | POA: Diagnosis not present

## 2016-01-14 LAB — BASIC METABOLIC PANEL
Anion gap: 10 (ref 5–15)
BUN: 31 mg/dL — AB (ref 6–20)
CO2: 27 mmol/L (ref 22–32)
CREATININE: 0.79 mg/dL (ref 0.44–1.00)
Calcium: 8.2 mg/dL — ABNORMAL LOW (ref 8.9–10.3)
Chloride: 95 mmol/L — ABNORMAL LOW (ref 101–111)
GFR calc Af Amer: >60 mL/min (ref >60)
Glucose, Bld: 160 mg/dL — ABNORMAL HIGH (ref 65–99)
Potassium: 3.3 mmol/L — ABNORMAL LOW (ref 3.5–5.1)
SODIUM: 132 mmol/L — AB (ref 135–145)

## 2016-01-14 LAB — CBC WITH DIFFERENTIAL/PLATELET
BASOS PCT: 0 %
Basophils Absolute: 0 10*3/uL (ref 0.0–0.1)
EOS ABS: 0 10*3/uL (ref 0.0–0.7)
EOS PCT: 0 %
HCT: 43.1 % (ref 36.0–46.0)
Hemoglobin: 14.2 g/dL (ref 12.0–15.0)
Lymphocytes Relative: 19 %
Lymphs Abs: 0.9 10*3/uL (ref 0.7–4.0)
MCH: 27.7 pg (ref 26.0–34.0)
MCHC: 32.9 g/dL (ref 30.0–36.0)
MCV: 84 fL (ref 78.0–100.0)
MONO ABS: 0.6 10*3/uL (ref 0.1–1.0)
MONOS PCT: 13 %
NEUTROS PCT: 68 %
Neutro Abs: 3.1 10*3/uL (ref 1.7–7.7)
PLATELETS: 171 10*3/uL (ref 150–400)
RBC: 5.13 MIL/uL — ABNORMAL HIGH (ref 3.87–5.11)
RDW: 13.3 % (ref 11.5–15.5)
WBC: 4.6 10*3/uL (ref 4.0–10.5)

## 2016-01-14 MED ORDER — ALBUTEROL SULFATE (2.5 MG/3ML) 0.083% IN NEBU
2.5000 mg | INHALATION_SOLUTION | RESPIRATORY_TRACT | Status: DC | PRN
  Administered 2016-01-15: 2.5 mg via RESPIRATORY_TRACT
  Filled 2016-01-14: qty 3

## 2016-01-14 MED ORDER — ALBUTEROL SULFATE HFA 108 (90 BASE) MCG/ACT IN AERS
2.0000 | INHALATION_SPRAY | Freq: Once | RESPIRATORY_TRACT | Status: AC
  Administered 2016-01-14: 2 via RESPIRATORY_TRACT
  Filled 2016-01-14: qty 6.7

## 2016-01-14 MED ORDER — PREDNISONE 20 MG PO TABS
40.0000 mg | ORAL_TABLET | Freq: Every day | ORAL | Status: DC
  Administered 2016-01-15 – 2016-01-16 (×2): 40 mg via ORAL
  Filled 2016-01-14 (×2): qty 2

## 2016-01-14 MED ORDER — ONDANSETRON HCL 4 MG/2ML IJ SOLN
4.0000 mg | Freq: Once | INTRAMUSCULAR | Status: AC
  Administered 2016-01-14: 4 mg via INTRAVENOUS
  Filled 2016-01-14: qty 2

## 2016-01-14 MED ORDER — SODIUM CHLORIDE 0.9 % IV SOLN
INTRAVENOUS | Status: AC
  Administered 2016-01-14 – 2016-01-15 (×2): via INTRAVENOUS

## 2016-01-14 MED ORDER — GUAIFENESIN ER 600 MG PO TB12
600.0000 mg | ORAL_TABLET | Freq: Two times a day (BID) | ORAL | Status: DC
  Administered 2016-01-14 – 2016-01-16 (×4): 600 mg via ORAL
  Filled 2016-01-14 (×4): qty 1

## 2016-01-14 MED ORDER — IPRATROPIUM-ALBUTEROL 0.5-2.5 (3) MG/3ML IN SOLN
3.0000 mL | Freq: Once | RESPIRATORY_TRACT | Status: AC
  Administered 2016-01-14: 3 mL via RESPIRATORY_TRACT
  Filled 2016-01-14: qty 3

## 2016-01-14 MED ORDER — HYDROCODONE-ACETAMINOPHEN 5-325 MG PO TABS: 1.0000 | ORAL_TABLET | ORAL | Status: DC | PRN

## 2016-01-14 MED ORDER — SODIUM CHLORIDE 0.9% FLUSH
3.0000 mL | Freq: Two times a day (BID) | INTRAVENOUS | Status: DC
  Administered 2016-01-14 – 2016-01-16 (×4): 3 mL via INTRAVENOUS

## 2016-01-14 MED ORDER — ENOXAPARIN SODIUM 40 MG/0.4ML ~~LOC~~ SOLN
40.0000 mg | SUBCUTANEOUS | Status: DC
  Administered 2016-01-14 – 2016-01-15 (×2): 40 mg via SUBCUTANEOUS
  Filled 2016-01-14 (×2): qty 0.4

## 2016-01-14 MED ORDER — ONDANSETRON HCL 4 MG PO TABS: 4.0000 mg | ORAL_TABLET | Freq: Four times a day (QID) | ORAL | Status: DC | PRN

## 2016-01-14 MED ORDER — IPRATROPIUM-ALBUTEROL 0.5-2.5 (3) MG/3ML IN SOLN: 3.0000 mL | Freq: Four times a day (QID) | RESPIRATORY_TRACT | Status: DC | PRN

## 2016-01-14 MED ORDER — POTASSIUM CHLORIDE CRYS ER 20 MEQ PO TBCR
20.0000 meq | EXTENDED_RELEASE_TABLET | Freq: Every day | ORAL | Status: DC
  Administered 2016-01-15 – 2016-01-16 (×2): 20 meq via ORAL
  Filled 2016-01-14 (×2): qty 1

## 2016-01-14 MED ORDER — POTASSIUM CHLORIDE CRYS ER 20 MEQ PO TBCR
40.0000 meq | EXTENDED_RELEASE_TABLET | Freq: Once | ORAL | Status: AC
  Administered 2016-01-14: 40 meq via ORAL
  Filled 2016-01-14: qty 2

## 2016-01-14 MED ORDER — ACETAMINOPHEN 325 MG PO TABS: 650.0000 mg | ORAL_TABLET | Freq: Four times a day (QID) | ORAL | Status: DC | PRN

## 2016-01-14 MED ORDER — IPRATROPIUM-ALBUTEROL 0.5-2.5 (3) MG/3ML IN SOLN
3.0000 mL | Freq: Four times a day (QID) | RESPIRATORY_TRACT | Status: DC
  Filled 2016-01-14: qty 3

## 2016-01-14 MED ORDER — PREDNISONE 20 MG PO TABS
60.0000 mg | ORAL_TABLET | Freq: Once | ORAL | Status: AC
  Administered 2016-01-14: 60 mg via ORAL
  Filled 2016-01-14: qty 3

## 2016-01-14 MED ORDER — SODIUM CHLORIDE 0.9 % IV BOLUS (SEPSIS)
1000.0000 mL | Freq: Once | INTRAVENOUS | Status: AC
  Administered 2016-01-14: 1000 mL via INTRAVENOUS

## 2016-01-14 MED ORDER — ACETAMINOPHEN 650 MG RE SUPP: 650.0000 mg | Freq: Four times a day (QID) | RECTAL | Status: DC | PRN

## 2016-01-14 MED ORDER — ROSUVASTATIN CALCIUM 10 MG PO TABS
10.0000 mg | ORAL_TABLET | Freq: Two times a day (BID) | ORAL | Status: DC
  Administered 2016-01-14 – 2016-01-16 (×4): 10 mg via ORAL
  Filled 2016-01-14 (×4): qty 1

## 2016-01-14 MED ORDER — ONDANSETRON HCL 4 MG/2ML IJ SOLN: 4.0000 mg | Freq: Four times a day (QID) | INTRAMUSCULAR | Status: DC | PRN

## 2016-01-14 MED ORDER — ASPIRIN 81 MG PO CHEW
81.0000 mg | CHEWABLE_TABLET | ORAL | Status: DC
  Administered 2016-01-16: 81 mg via ORAL
  Filled 2016-01-14: qty 1

## 2016-01-14 MED ORDER — AEROCHAMBER PLUS FLO-VU MEDIUM MISC
1.0000 | Freq: Once | Status: AC
  Administered 2016-01-14: 1
  Filled 2016-01-14: qty 1

## 2016-01-14 NOTE — ED Notes (Signed)
Hospitalist at bedside 

## 2016-01-14 NOTE — Progress Notes (Signed)
Pre visit review using our clinic review tool, if applicable. No additional management support is needed unless otherwise documented below in the visit note. 

## 2016-01-14 NOTE — ED Provider Notes (Signed)
[place] DEPT Provider Note   CSN: [id number] Arrival date & time: 01/14/16  1509     History   Chief Complaint Chief Complaint  Patient presents with  . Influenza    HPI Cassandra James is a 70 y.o. female.  The history is provided by the patient.  Shortness of Breath  This is a new problem. The average episode lasts 3 days. The problem occurs continuously.Episode onset: 3 days ago. The problem has been gradually worsening. Associated symptoms include cough, sputum production (yellow) and vomiting (3x). Pertinent negatives include no fever, no abdominal pain and no leg swelling. She has tried nothing for the symptoms. She has had no prior hospitalizations. Associated medical issues do not include COPD, pneumonia, PE, heart failure, past MI or DVT.    Past Medical History:  Diagnosis Date  . Cataract   . Colon polyps    history of  . COLONIC POLYPS, HX OF 08/09/2006   Qualifier: Diagnosis of  By: Scherrie Gerlach    . DECREASED HEARING 11/25/2008   Qualifier: Diagnosis of  By: Scherrie Gerlach    . Elevated LFTs    obstructive disease  . HIATAL HERNIA 02/05/2009   Qualifier: Diagnosis of  By: Harriet Pho    . Hyperglycemia 11/10/2013  . Hyperlipidemia   . Hypertension   . Osteoporosis    was osteopenia-last bone scan all clear   . Pernicious anemia     Patient Active Problem List   Diagnosis Date Noted  . Hyperglycemia 11/10/2013  . Osteoarthritis 11/10/2013  . B12 deficiency 11/10/2013  . Hyperlipemia 08/09/2006  . Essential hypertension 08/09/2006  . Osteoporosis 08/09/2006    Past Surgical History:  Procedure Laterality Date  . [place] RELEASE Left 2016  . cataract surgery  2011   Bilateral  . COLONOSCOPY    . HAND TENDON SURGERY  01/16/2014   Dr Burney Gauze  . OVARIAN CYST SURGERY  1968  . RETINAL DETACHMENT SURGERY  2011   left  . TOENAIL EXCISION Right    Dr Patrick Jupiter great toenail excision  .  TRAPEZIUM RESECTION Left 2016   left wrist   . TUBAL LIGATION  1979    OB History    No data available       Home Medications    Prior to Admission medications   Medication Sig Start Date End Date Taking? Authorizing Provider  aspirin 81 MG tablet Take by mouth. 1/2 tab qod     Historical Provider, MD  Cholecalciferol (VITAMIN D) 1000 UNITS capsule Take 1,000 Units by mouth daily.     Historical Provider, MD  cyanocobalamin (,VITAMIN B-12,) 1000 MCG/ML injection Inject 1 mL (1,000 mcg total) into the muscle every 30 (thirty) days. 03/03/15   Lucretia Kern, DO  hyoscyamine (LEVSIN SL) 0.125 MG SL tablet DISSOLVE 2 TABLETS(0.25 MG) UNDER THE TONGUE EVERY 4 HOURS AS NEEDED 08/26/15   Lucretia Kern, DO  potassium chloride SA (K-DUR,KLOR-CON) 20 MEQ tablet TAKE 1 TABLET BY MOUTH EVERY DAY 11/29/15   Lucretia Kern, DO  PROAIR HFA 108 [phone] Base) MCG/ACT inhaler INHALE 2 PUFFS INTO THE LUNGS EVERY 6 HOURS AS NEEDED FOR WHEEZING 12/23/15   Lucretia Kern, DO  rosuvastatin (CRESTOR) 20 MG tablet TAKE 1 TABLET BY MOUTH EVERY DAY 11/09/15   Lucretia Kern, DO  valsartan-hydrochlorothiazide (DIOVAN-HCT) 320-25 MG tablet TAKE 1 TABLET BY MOUTH EVERY DAY 08/26/15   Lucretia Kern,  DO    Family History Family History  Problem Relation Age of Onset  . Pernicious anemia Mother   . Cancer Father     lung/ bone  . Osteoporosis Father   . Hypothyroidism Father   . Colon cancer Neg Hx   . Colon polyps Neg Hx   . Rectal cancer Neg Hx   . Stomach cancer Neg Hx     Social History Social History  Substance Use Topics  . Smoking status: Former Smoker    Quit date: 01/17/2000  . Smokeless tobacco: Never Used  . Alcohol use 1.2 - 1.8 oz/week    2 - 3 Standard drinks or equivalent per week     Comment: 3 times a week      Allergies   Cephalexin; Codeine; and Other   Review of Systems Review of Systems  Constitutional: Negative for fever.  Respiratory: Positive for cough, sputum production (yellow) and  shortness of breath.   Cardiovascular: Negative for leg swelling.  Gastrointestinal: Positive for vomiting (3x). Negative for abdominal pain.  All other systems reviewed and are negative.    Physical Exam Updated Vital Signs BP 130/68   Pulse (!) 58   Resp 15   SpO2 92%   Physical Exam  Constitutional: She is oriented to person, place, and time. She appears well-developed and well-nourished. No distress.  HENT:  Head: Normocephalic.  Nose: Nose normal.  Eyes: Conjunctivae are normal.  Neck: Neck supple. No tracheal deviation present.  Cardiovascular: Normal rate, regular rhythm and normal heart sounds.   Pulmonary/Chest: Effort normal. No respiratory distress. She has wheezes (coarse inspiratory and expiratory).  Abdominal: Soft. She exhibits no distension. There is no tenderness. There is no guarding.  Neurological: She is alert and oriented to person, place, and time.  Skin: Skin is warm and dry.  Psychiatric: She has a normal mood and affect.     ED Treatments / Results  Labs (all labs ordered are listed, but only abnormal results are displayed) Labs Reviewed - No data to display  EKG  EKG Interpretation None       Radiology No results found.  Procedures Procedures (including critical care time)  Medications Ordered in ED Medications - No data to display   Initial Impression / Assessment and Plan / ED Course  I have reviewed the triage vital signs and the nursing notes.  Pertinent labs & imaging results that were available during my care of the patient were reviewed by me and considered in my medical decision making (see chart for details).  Clinical Course     71 y.o. female presents with swab confirmed influenza B. She has had vomiting/diarrhea and progressive shortness of breath. Symptoms began 3 days ago, not a good candidate for tamiflu therapy. Her main issue today is wheezing and hypoxia. She appears to have an acute viral bronchitis without focal  consolidation on CXR> off of oxygen she is saturating 85%. Provided neb with some improvement subjectively but continues to wheeze. Steroids administered to help pulmonary function, Hospitalist was consulted for admission for new O2 requirement and will see the patient in the emergency department.   Final Clinical Impressions(s) / ED Diagnoses   Final diagnoses:  Viral bronchitis  Hypoxemia    New Prescriptions New Prescriptions   No medications on file     Leo Grosser, MD 01/15/16 [phone]

## 2016-01-14 NOTE — ED Notes (Signed)
Patient transported to X-ray 

## 2016-01-14 NOTE — ED Triage Notes (Signed)
Pt sent by her PCP for flu, decreased O2 saturation, and hypotension. Pt states she has had cough, weakness, and shortness of breath since Tuesday.

## 2016-01-14 NOTE — H&P (Signed)
Cassandra James [id number] DOB: 1945/03/21 DOA: 01/14/2016     PCP: Lucretia Kern., DO   Outpatient Specialists: none   Patient coming from:  home Lives   With family   Chief Complaint: Wheezing and bad cough  HPI: Cassandra James is a 70 y.o. female with medical history significant of HTN, HLD osteoporosis Pernacious anemia  Presented with cough and chest congestion and body aches initially she was evaluated by her primary care provider she was diagnosed with influenza B she denied any chest pain that she patient developed wheezing and worsening shortness of breath. While at primary care provider she was noted to be febrile she has no history of asthma or COPD never been a smoker. She was evaluated in the office and was found to be wheezing on 27th of December she was given DuoNeb with some improvement but continues to have some wheezing. She was sent to emergency department due to increase work of breathing from her PCP office but left due to long wait. Patient developed worsening wheezing shortness of breath and now nausea and vomiting today she is unable to take anything by mouth name and able to drink any fluids. She feels they weak and tired her husband brought her back to emergency department today for repeat evaluation. Patient now reports cough and sputum production of yellow cough inducing vomiting. Still reports no subjective fevers although she states that when she had a fever she can feel it.   Family states she has hx of wheezing with exertion. BUt the wheezing has become more severe with illness. Reports feeling lightheaded when she sits up. No hx of DM or CAD. She and family had flu shot this year. Reports sick contacts at home.    IN ER:  Temp (24hrs), Avg:98.4 F (36.9 C), Min:98.4 F (36.9 C), Max:98.4 F (36.9 C)   90% on 2Land 85% on RA Hr 58 Bp 117/71 WBC 4.6 Hg 14.2  NA 132 K 3.3 BUN 31 CXR negative Following Medications were ordered in ER: Medications   albuterol (PROVENTIL HFA;VENTOLIN HFA) 108 (90 Base) MCG/ACT inhaler 2 puff (not administered)  AEROCHAMBER PLUS FLO-VU MEDIUM MISC 1 each (not administered)  predniSONE (DELTASONE) tablet 60 mg (not administered)  ipratropium-albuterol (DUONEB) 0.5-2.5 (3) MG/3ML nebulizer solution 3 mL (3 mLs Nebulization Given 01/14/16 1736)  sodium chloride 0.9 % bolus 1,000 mL (0 mLs Intravenous Stopped 01/14/16 1945)  ondansetron (ZOFRAN) injection 4 mg (4 mg Intravenous Given 01/14/16 1811)     Hospitalist was called for admission for Viral bronchitis and dehydration secondary to influenza B  Review of Systems:    Pertinent positives include: Fevers, chills, fatigue, shortness of breath at rest.  dyspnea on exertion, excess mucus productive cough, wheezing. Constitutional:  No weight loss, night sweats, weight loss  HEENT:  No headaches, Difficulty swallowing,Tooth/dental problems,Sore throat,  No sneezing, itching, ear ache, nasal congestion, post nasal drip,  Cardio-vascular:  No chest pain, Orthopnea, PND, anasarca, dizziness, palpitations.no Bilateral lower extremity swelling  GI:  No heartburn, indigestion, abdominal pain, nausea, vomiting, diarrhea, change in bowel habits, loss of appetite, melena, blood in stool, hematemesis Resp:    No non-productive cough, No coughing up of blood.No change in color of mucus.No  Skin:  no rash or lesions. No jaundice GU:  no dysuria, change in color of urine, no urgency or frequency. No straining to urinate.  No flank pain.  Musculoskeletal:  No joint pain or no joint swelling. No decreased range of motion. No  back pain.  Psych:  No change in mood or affect. No depression or anxiety. No memory loss.  Neuro: no localizing neurological complaints, no tingling, no weakness, no double vision, no gait abnormality, no slurred speech, no confusion  As per HPI otherwise 10 point review of systems negative.   Past Medical History: Past Medical History:    Diagnosis Date  . Cataract   . Colon polyps    history of  . COLONIC POLYPS, HX OF 08/09/2006   Qualifier: Diagnosis of  By: Scherrie Gerlach    . DECREASED HEARING 11/25/2008   Qualifier: Diagnosis of  By: Scherrie Gerlach    . Elevated LFTs    obstructive disease  . HIATAL HERNIA 02/05/2009   Qualifier: Diagnosis of  By: Harriet Pho    . Hyperglycemia 11/10/2013  . Hyperlipidemia   . Hypertension   . Osteoporosis    was osteopenia-last bone scan all clear   . Pernicious anemia    Past Surgical History:  Procedure Laterality Date  . [place] RELEASE Left 2016  . cataract surgery  2011   Bilateral  . COLONOSCOPY    . HAND TENDON SURGERY  01/16/2014   Dr Burney Gauze  . OVARIAN CYST SURGERY  1968  . RETINAL DETACHMENT SURGERY  2011   left  . TOENAIL EXCISION Right    Dr Patrick Jupiter great toenail excision  . TRAPEZIUM RESECTION Left 2016   left wrist   . TUBAL LIGATION  1979     Social History:  Ambulatory  independently      reports that she quit smoking about 16 years ago. She has never used smokeless tobacco. She reports that she drinks about 1.2 - 1.8 oz of alcohol per week . She reports that she does not use drugs.  Allergies:   Allergies  Allergen Reactions  . Cephalexin Anaphylaxis  . Cephalosporins Anaphylaxis  . Codeine Nausea And Vomiting       Family History:   Family History  Problem Relation Age of Onset  . Pernicious anemia Mother   . Cancer Father     lung/ bone  . Osteoporosis Father   . Hypothyroidism Father   . Colon cancer Neg Hx   . Colon polyps Neg Hx   . Rectal cancer Neg Hx   . Stomach cancer Neg Hx     Medications: Prior to Admission medications   Medication Sig Start Date End Date Taking? Authorizing Provider  albuterol (PROVENTIL HFA;VENTOLIN HFA) 108 (90 Base) MCG/ACT inhaler Inhale 1-2 puffs into the lungs every 6 (six) hours as needed for wheezing or shortness of breath.   Yes  Historical Provider, MD  aspirin 81 MG chewable tablet Chew 81 mg by mouth every other day.   Yes Historical Provider, MD  cholecalciferol (VITAMIN D) 1000 units tablet Take 1,000 Units by mouth daily.   Yes Historical Provider, MD  cyanocobalamin (,VITAMIN B-12,) 1000 MCG/ML injection Inject 1 mL (1,000 mcg total) into the muscle every 30 (thirty) days. 03/03/15  Yes Lucretia Kern, DO  hyoscyamine (LEVSIN SL) 0.125 MG SL tablet Place 0.25 mg under the tongue every 4 (four) hours as needed for cramping.   Yes Historical Provider, MD  potassium chloride SA (K-DUR,KLOR-CON) 20 MEQ tablet Take 20 mEq by mouth daily.   Yes Historical Provider, MD  rosuvastatin (CRESTOR) 20 MG tablet Take 10 mg by mouth 2 (two) times daily.   Yes Historical Provider, MD  valsartan-hydrochlorothiazide (DIOVAN-HCT) 320-25 MG tablet Take 1 tablet by mouth daily.   Yes Historical Provider, MD    Physical Exam: Patient Vitals for the past 24 hrs:  BP Temp Pulse Resp SpO2  01/14/16 1910 - - - - 90 %  01/14/16 1910 - - - - (!) 85 %  01/14/16 1830 117/71 - (!) 58 11 94 %  01/14/16 1813 127/61 - 63 14 94 %  01/14/16 1738 - - - - 96 %  01/14/16 1528 130/68 - (!) 58 15 92 %    1. General:  in No Acute distress 2. Psychological: Alert and  Oriented 3. Head/ENT:    Dry Mucous Membranes                          Head Non traumatic, neck supple                          Normal   Dentition 4. SKIN:  decreased Skin turgor,  Skin clean Dry and intact no rash 5. Heart: Regular rate and rhythm no Murmur, Rub or gallop 6. Lungs: some wheezes no crackles   7. Abdomen: Soft,  non-tender, Non distended 8. Lower extremities: no clubbing, cyanosis, or edema 9. Neurologically Grossly intact, moving all 4 extremities equally  10. MSK: Normal range of motion   body mass index is unknown because there is no height or weight on file.  Labs on Admission:   Labs on Admission: I have personally reviewed following labs and imaging  studies  CBC:  Recent Labs Lab 01/14/16 1754  WBC 4.6  NEUTROABS 3.1  HGB 14.2  HCT 43.1  MCV 84.0  PLT [id number]   Basic Metabolic Panel:  Recent Labs Lab 01/14/16 1754  NA 132*  K 3.3*  CL 95*  CO2 27  GLUCOSE 160*  BUN 31*  CREATININE 0.79  CALCIUM 8.2*   GFR: Estimated Creatinine Clearance: 63 mL/min (by C-G formula based on SCr of 0.79 mg/dL). Liver Function Tests: No results for input(s): AST, ALT, ALKPHOS, BILITOT, PROT, ALBUMIN in the last 168 hours. No results for input(s): LIPASE, AMYLASE in the last 168 hours. No results for input(s): AMMONIA in the last 168 hours. Coagulation Profile: No results for input(s): INR, PROTIME in the last 168 hours. Cardiac Enzymes: No results for input(s): CKTOTAL, CKMB, CKMBINDEX, TROPONINI in the last 168 hours. BNP (last 3 results) No results for input(s): PROBNP in the last 8760 hours. HbA1C: No results for input(s): HGBA1C in the last 72 hours. CBG: No results for input(s): GLUCAP in the last 168 hours. Lipid Profile: No results for input(s): CHOL, HDL, LDLCALC, TRIG, CHOLHDL, LDLDIRECT in the last 72 hours. Thyroid Function Tests: No results for input(s): TSH, T4TOTAL, FREET4, T3FREE, THYROIDAB in the last 72 hours. Anemia Panel: No results for input(s): VITAMINB12, FOLATE, FERRITIN, TIBC, IRON, RETICCTPCT in the last 72 hours. Urine analysis: No results found for: COLORURINE, APPEARANCEUR, LABSPEC, PHURINE, GLUCOSEU, HGBUR, BILIRUBINUR, KETONESUR, PROTEINUR, UROBILINOGEN, NITRITE, LEUKOCYTESUR Sepsis Labs: @LABRCNTIP (procalcitonin:4,lacticidven:4) )No results found for this or any previous visit (from the past 240 hour(s)).     UA  ordered  Lab Results  Component Value Date   HGBA1C 6.3 03/04/2015    Estimated Creatinine Clearance: 63 mL/min (by C-G formula based on SCr of 0.79 mg/dL).  BNP (last 3 results) No results for input(s): PROBNP in the last 8760 hours.   ECG REPORT  Independently reviewed Rate:  56  Rhythm: Sinus bradycardia ST&T Change: No acute ischemic changes   QTC 444  There were no vitals filed for this visit.   Cultures: No results found for: SDES, [place], CULT, REPTSTATUS   Radiological Exams on Admission: Dg Chest 2 View  Result Date: 01/14/2016 CLINICAL DATA:  Hypoxemia.  Cough. EXAM: CHEST  2 VIEW COMPARISON:  Radiographs of May 21, 2008. FINDINGS: The heart size and mediastinal contours are within normal limits. Both lungs are clear. No pneumothorax or pleural effusion is noted. Atherosclerosis of thoracic aorta is noted. The visualized skeletal structures are unremarkable. IMPRESSION: No active cardiopulmonary disease. Electronically Signed   By: Marijo Conception, M.D.   On: 01/14/2016 18:19    Chart has been reviewed    Assessment/Plan   70 y.o. female with medical history significant of HTN, HLD osteoporosis Pernacious anemia being admitted for Viral bronchitis and dehydration secondary to influenza B   Present on Admission:  . Acute respiratory failure with hypoxia (HCC) continue oxygen supplementation and supportive management. Would need to check oxygen saturations with ambulation prior to discharge  . Sleep apnea not on C Pap at home continue to monitor . Viral bronchitis with reactive airway component family reports history of occasional wheezing at baseline patient may have reactive airway disease she wasn't aware off. Will initiate steroid taper and albuterol when necessary and scheduled DuoNeb would benefit from PFTs as an outpatient once improves . Dehydration administer IV fluids and rehydrate . Hypokalemia replace and continue to monitor check magnesium level . Influenza B supportive management patient is at a window for treatment with Tamiflu . Hyponatremia - likely secondary to dehydration will obtain urine electrolytes . Essential hypertension given dehydration soft blood pressure will hold home medications  Other plan as per  orders.  DVT prophylaxis:    Lovenox     Code Status:  FULL CODE  as per patient    Family Communication:   Family  at  Bedside  plan of care was discussed with    Husband,   Disposition Plan:     To home once workup is complete and patient is stable                       Consults called: none  Admission status:    obs   Level of care       medical floor             I have spent a total of 56 min on this admission   Brandis Wixted 01/14/2016, 9:18 PM    [facility]  Pager [phone]   after 2 AM please page floor coverage PA If 7AM-7PM, please contact the day team taking care of the patient  [email or web address]  Password [email or web address]

## 2016-01-14 NOTE — Progress Notes (Signed)
HPI:  Cassandra James is a pleasant 70 year old with a past medical history significant for hypertension, pernicious anemia, IBS and hyperlipidemia here for an acute visit for shortness of breath. She reports that she became acutely ill about 3 days ago. Symptoms included shortness of breath, cough, nausea, body aches and chills. She went to an urgent care clinic 2 days ago and was diagnosed with influenza B and due to her labored breathing was sent to emergency room. However, she reports that she and her husband left the emergency room when they were told that there would be a several hour wait. Since that time, she unfortunately has become more and more sick. She reports that she is having worsening difficulty breathing, nausea, vomiting today and inability to tolerate oral intake today. She reports that she has not taken in any fluids or food today by mouth. Continues to feel weak and tired.  ROS: See pertinent positives and negatives per HPI.  Past Medical History:  Diagnosis Date  . Cataract   . Colon polyps    history of  . COLONIC POLYPS, HX OF 08/09/2006   Qualifier: Diagnosis of  By: Scherrie Gerlach    . DECREASED HEARING 11/25/2008   Qualifier: Diagnosis of  By: Scherrie Gerlach    . Elevated LFTs    obstructive disease  . HIATAL HERNIA 02/05/2009   Qualifier: Diagnosis of  By: Harriet Pho    . Hyperglycemia 11/10/2013  . Hyperlipidemia   . Hypertension   . Osteoporosis    was osteopenia-last bone scan all clear   . Pernicious anemia     Past Surgical History:  Procedure Laterality Date  . [place] RELEASE Left 2016  . cataract surgery  2011   Bilateral  . COLONOSCOPY    . HAND TENDON SURGERY  01/16/2014   Dr Burney Gauze  . OVARIAN CYST SURGERY  1968  . RETINAL DETACHMENT SURGERY  2011   left  . TOENAIL EXCISION Right    Dr Patrick Jupiter great toenail excision  . TRAPEZIUM RESECTION Left 2016   left wrist   . TUBAL  LIGATION  1979    Family History  Problem Relation Age of Onset  . Pernicious anemia Mother   . Cancer Father     lung/ bone  . Osteoporosis Father   . Hypothyroidism Father   . Colon cancer Neg Hx   . Colon polyps Neg Hx   . Rectal cancer Neg Hx   . Stomach cancer Neg Hx     Social History   Social History  . Marital status: Married    Spouse name: N/A  . Number of children: N/A  . Years of education: N/A   Social History Main Topics  . Smoking status: Former Smoker    Quit date: 01/17/2000  . Smokeless tobacco: Never Used  . Alcohol use 1.2 - 1.8 oz/week    2 - 3 Standard drinks or equivalent per week     Comment: 3 times a week   . Drug use: No  . Sexual activity: Not Asked   Other Topics Concern  . 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  aspirin 81 MG tablet, Take by mouth. 1/2 tab qod , Disp: , Rfl:  .  Cholecalciferol (VITAMIN D) 1000 UNITS capsule, Take 1,000 Units by mouth daily. , Disp: , Rfl:  .  cyanocobalamin (,VITAMIN B-12,) 1000 MCG/ML injection, Inject 1 mL (1,000 mcg total) into the muscle every 30 (thirty) days., Disp: 1 mL, Rfl: 12 .  hyoscyamine (LEVSIN SL) 0.125 MG SL tablet, DISSOLVE 2 TABLETS(0.25 MG) UNDER THE TONGUE EVERY 4 HOURS AS NEEDED, Disp: 30 tablet, Rfl: 0 .  potassium chloride SA (K-DUR,KLOR-CON) 20 MEQ tablet, TAKE 1 TABLET BY MOUTH EVERY DAY, Disp: 90 tablet, Rfl: 1 .  PROAIR HFA 108 (90 Base) MCG/ACT inhaler, INHALE 2 PUFFS INTO THE LUNGS EVERY 6 HOURS AS NEEDED FOR WHEEZING, Disp: 8.5 g, Rfl: 3 .  rosuvastatin (CRESTOR) 20 MG tablet, TAKE 1 TABLET BY MOUTH EVERY DAY, Disp: 90 tablet, Rfl: 1 .  valsartan-hydrochlorothiazide (DIOVAN-HCT) 320-25 MG tablet,  TAKE 1 TABLET BY MOUTH EVERY DAY, Disp: 90 tablet, Rfl: 1  EXAM:  Vitals:   01/14/16 1426  BP: (!) 120/50  Pulse: 67  Temp: 98.4 F (36.9 C)    Body mass index is 31.16 kg/m.  GENERAL: vitals reviewed and listed above, seems to have mildly altered mental status as did not know that she was spilling her emesis on the floor, vomited while in the room, pale and appears to feel quite unwell  HEENT: atraumatic, conjunttiva clear, no obvious abnormalities on inspection of external nose and ears, normal appearance of ear canals and TMs, clear nasal congestion, mild post oropharyngeal erythema with PND, no tonsillar edema or exudate, no sinus TTP  NECK: no obvious masses on inspection  LUNGS: Scattered wheezing and questionable rhonchorous breath sounds right base, increased respiratory rate around 30-40, O2 sats around 93% on RA  CV: HRRR, no peripheral edema  MS: moves all extremities without noticeable abnormality  PSYCH: pleasant and cooperative, no obvious depression or anxiety  ASSESSMENT AND PLAN:  Discussed the following assessment and plan:  Influenza B  Shortness of breath  Dehydration  -Worsening shortness of breath, mild respiratory distress and mild hypoxia along with inability to tolerate oral intake fluids and likely dehydration in the setting of influenza B and potential secondary respiratory infection -She opted for evaluation and treatment in the emergency room. Assistant advised to notify the emergency room triage. Offered EMS transport, patient and husband declined and preferred to drive by private vehicle. -Patient advised to return or notify a doctor immediately if symptoms worsen or persist or new concerns arise.  There are no Patient Instructions on file for this visit.  Colin Benton R., DO

## 2016-01-15 DIAGNOSIS — J9601 Acute respiratory failure with hypoxia: Secondary | ICD-10-CM

## 2016-01-15 DIAGNOSIS — E876 Hypokalemia: Secondary | ICD-10-CM

## 2016-01-15 DIAGNOSIS — J189 Pneumonia, unspecified organism: Secondary | ICD-10-CM

## 2016-01-15 DIAGNOSIS — E860 Dehydration: Secondary | ICD-10-CM | POA: Diagnosis not present

## 2016-01-15 DIAGNOSIS — J101 Influenza due to other identified influenza virus with other respiratory manifestations: Secondary | ICD-10-CM

## 2016-01-15 DIAGNOSIS — E6609 Other obesity due to excess calories: Secondary | ICD-10-CM

## 2016-01-15 DIAGNOSIS — I10 Essential (primary) hypertension: Secondary | ICD-10-CM | POA: Diagnosis not present

## 2016-01-15 DIAGNOSIS — Z6830 Body mass index (BMI) 30.0-30.9, adult: Secondary | ICD-10-CM

## 2016-01-15 LAB — CBC
HCT: 41.4 % (ref 36.0–46.0)
Hemoglobin: 13.7 g/dL (ref 12.0–15.0)
MCH: 27.8 pg (ref 26.0–34.0)
MCHC: 33.1 g/dL (ref 30.0–36.0)
MCV: 84 fL (ref 78.0–100.0)
PLATELETS: 190 10*3/uL (ref 150–400)
RBC: 4.93 MIL/uL (ref 3.87–5.11)
RDW: 13.2 % (ref 11.5–15.5)
WBC: 5 10*3/uL (ref 4.0–10.5)

## 2016-01-15 LAB — URINALYSIS, ROUTINE W REFLEX MICROSCOPIC
BILIRUBIN URINE: NEGATIVE
GLUCOSE, UA: NEGATIVE mg/dL
Hgb urine dipstick: NEGATIVE
Ketones, ur: 20 mg/dL — AB
LEUKOCYTES UA: NEGATIVE
NITRITE: NEGATIVE
PH: 6 (ref 5.0–8.0)
Protein, ur: NEGATIVE mg/dL
SPECIFIC GRAVITY, URINE: 1.02 (ref 1.005–1.030)

## 2016-01-15 LAB — COMPREHENSIVE METABOLIC PANEL
ALBUMIN: 3.5 g/dL (ref 3.5–5.0)
ALK PHOS: 43 U/L (ref 38–126)
ALT: 33 U/L (ref 14–54)
AST: 33 U/L (ref 15–41)
Anion gap: 10 (ref 5–15)
BUN: 29 mg/dL — AB (ref 6–20)
CALCIUM: 7.8 mg/dL — AB (ref 8.9–10.3)
CO2: 25 mmol/L (ref 22–32)
CREATININE: 0.63 mg/dL (ref 0.44–1.00)
Chloride: 102 mmol/L (ref 101–111)
GFR calc Af Amer: >60 mL/min (ref >60)
GFR calc non Af Amer: >60 mL/min (ref >60)
GLUCOSE: 140 mg/dL — AB (ref 65–99)
Potassium: 4 mmol/L (ref 3.5–5.1)
SODIUM: 137 mmol/L (ref 135–145)
Total Bilirubin: 0.5 mg/dL (ref 0.3–1.2)
Total Protein: 6.4 g/dL — ABNORMAL LOW (ref 6.5–8.1)

## 2016-01-15 LAB — TSH: TSH: 0.89 u[IU]/mL (ref 0.350–4.500)

## 2016-01-15 LAB — MAGNESIUM: Magnesium: 2.3 mg/dL (ref 1.7–2.4)

## 2016-01-15 LAB — EXPECTORATED SPUTUM ASSESSMENT W GRAM STAIN, RFLX TO RESP C

## 2016-01-15 LAB — SODIUM, URINE, RANDOM: SODIUM UR: 32 mmol/L

## 2016-01-15 LAB — EXPECTORATED SPUTUM ASSESSMENT W REFEX TO RESP CULTURE

## 2016-01-15 LAB — STREP PNEUMONIAE URINARY ANTIGEN: STREP PNEUMO URINARY ANTIGEN: POSITIVE — AB

## 2016-01-15 LAB — CREATININE, URINE, RANDOM: Creatinine, Urine: 105.45 mg/dL

## 2016-01-15 LAB — PHOSPHORUS: Phosphorus: 4 mg/dL (ref 2.5–4.6)

## 2016-01-15 LAB — OSMOLALITY, URINE: Osmolality, Ur: 803 mOsm/kg (ref 300–900)

## 2016-01-15 MED ORDER — AZITHROMYCIN 250 MG PO TABS
250.0000 mg | ORAL_TABLET | Freq: Every day | ORAL | Status: DC
  Administered 2016-01-16: 250 mg via ORAL
  Filled 2016-01-15: qty 1

## 2016-01-15 MED ORDER — DEXTROSE 5 % IV SOLN
500.0000 mg | Freq: Once | INTRAVENOUS | Status: AC
  Administered 2016-01-15: 500 mg via INTRAVENOUS
  Filled 2016-01-15: qty 500

## 2016-01-15 MED ORDER — IPRATROPIUM-ALBUTEROL 0.5-2.5 (3) MG/3ML IN SOLN
3.0000 mL | Freq: Four times a day (QID) | RESPIRATORY_TRACT | Status: DC
  Administered 2016-01-15 – 2016-01-16 (×3): 3 mL via RESPIRATORY_TRACT
  Filled 2016-01-15 (×3): qty 3

## 2016-01-15 NOTE — Care Management Obs Status (Signed)
[place] NOTIFICATION   Patient Details  Name: Cassandra James MRN: [id number] Date of Birth: July 22, 1945   Medicare Observation Status Notification Given:  Yes (droplet precaution, left copy of form, explained notification)    Erenest Rasher, RN 01/15/2016, 5:59 PM

## 2016-01-15 NOTE — Progress Notes (Signed)
Assumed care of patient from Seth Bake, [place]. Patient resting quietly during bedside rounds, husband at side, no complaints. No distress.

## 2016-01-15 NOTE — Progress Notes (Signed)
SATURATION QUALIFICATIONS: (This note is used to comply with regulatory documentation for home oxygen)  Patient Saturations on Room Air at Rest = 90%  Patient Saturations on Room Air while Ambulating = 88%  Patient Saturations on 2 Liters of oxygen while Ambulating = 93%  Please briefly explain why patient needs home oxygen:

## 2016-01-15 NOTE — Progress Notes (Signed)
[facility] PROGRESS NOTE  Cassandra James [id number] DOB: Apr 28, 1945 DOA: 01/14/2016 PCP: Lucretia Kern., DO  Interim summary and HPI 70 y.o. female with medical history significant of HTN, HLD, osteoporosis and Pernacious anemia; who presented with SOB, general malaise, inability to tolerate PO's and weakness. Recently diagnosed with influenza at urgent care (apporx 3-4 days prior to admission) and subsequent worsening symptoms and hypoxia while being assessed at PCP office; At that moment patient was also hypoxic and with increase wheezing. Admitted for further evaluation and treatment of acute resp failure and possible CAP.  Assessment/Plan: 1-acute resp failure with hypoxia: due to influenza B and bronchitis/CAP -patient still SOB and with desat on RA -will start tx with antibiotics as patient with positive strep pneumoniae in urine -will continue steroids and nebulizer treatement -weaned oxygen as tolerated . -out of therapeutic window for tamiflu and currently afebrile  2-dehydration and hypokalemia -will continue IVF's, supportive care and electrolytes repletion   3-essential HTN: -stable -will watch another 24 hours w/o antihypertensive agents   4-obesity Body mass index is 30.15 kg/m. -low calorie and increase exercise discussed with patient   5-HLD -continue statins   Code Status: Full Family Communication: husband at bedside  Disposition Plan: will keep in close observation, continue nebulizer treatment, steroids, antibiotics now and will follow clinical response. Hopefully home in the next 24/48 hours.    Consultants:  None   Procedures:  See below for x-ray reports   Antibiotics:  zithromax 12/30  HPI/Subjective: Afebrile, no CP, no nausea or vomiting. Still SOB, tachypneic and with desaturation into mid 80's on RA with minimal exertion.   Objective: Vitals:   01/15/16 0536 01/15/16 1531  BP: 134/64 (!) 113/43  Pulse: 86 72  Resp: 14  15  Temp: 97.7 F (36.5 C) 98.4 F (36.9 C)    Intake/Output Summary (Last 24 hours) at 01/15/16 1732 Last data filed at 01/15/16 1532  Gross per 24 hour  Intake          1679.17 ml  Output              700 ml  Net           979.17 ml   Filed Weights   01/14/16 2210  Weight: 78.4 kg (172 lb 14.4 oz)    Exam:   General: afebrile and feeling better. Still SOB, with acute desaturation on RA with minimal exertion, diffuse wheezing and tachypnea. No nausea, no vomiting and no CP.  Cardiovascular: S1 and S2, no rubs or gallops  Respiratory: diffuse wheezing and rhonchi, no crackles  Abdomen: soft, NT, ND, positive BS  Musculoskeletal: no edema, no cyanosis  Data Reviewed: Basic Metabolic Panel:  Recent Labs Lab 01/14/16 1754 01/15/16 0533  NA 132* 137  K 3.3* 4.0  CL 95* 102  CO2 27 25  GLUCOSE 160* 140*  BUN 31* 29*  CREATININE 0.79 0.63  CALCIUM 8.2* 7.8*  MG  --  2.3  PHOS  --  4.0   Liver Function Tests:  Recent Labs Lab 01/15/16 0533  AST 33  ALT 33  ALKPHOS 43  BILITOT 0.5  PROT 6.4*  ALBUMIN 3.5   CBC:  Recent Labs Lab 01/14/16 1754 01/15/16 0533  WBC 4.6 5.0  NEUTROABS 3.1  --   HGB 14.2 13.7  HCT 43.1 41.4  MCV 84.0 84.0  PLT 171 190   CBG: No results for input(s): GLUCAP in the last 168 hours.  Recent Results (from the past 240  hour(s))  Culture, blood (routine x 2) Call MD if unable to obtain prior to antibiotics being given     Status: None (Preliminary result)   Collection Time: 01/14/16 10:18 PM  Result Value Ref Range Status   Specimen Description BLOOD LEFT ANTECUBITAL  Final   Special Requests BOTTLES DRAWN AEROBIC AND ANAEROBIC 5CC  Final   Culture   Final    NO GROWTH < 24 HOURS Performed at [facility]    Report Status PENDING  Incomplete  Culture, blood (routine x 2) Call MD if unable to obtain prior to antibiotics being given     Status: None (Preliminary result)   Collection Time: 01/14/16 10:22 PM   Result Value Ref Range Status   Specimen Description BLOOD LEFT HAND  Final   Special Requests   Final    BOTTLES DRAWN AEROBIC AND ANAEROBIC 5ML ANAEROBE BOTTLE NOT INCUBATED APPEARS CONTAMINATED   Culture   Final    NO GROWTH < 24 HOURS Performed at [facility]    Report Status PENDING  Incomplete  Culture, sputum-assessment     Status: None   Collection Time: 01/15/16  8:35 AM  Result Value Ref Range Status   Specimen Description SPUTUM  Final   Special Requests NONE  Final   Sputum evaluation THIS SPECIMEN IS ACCEPTABLE FOR SPUTUM CULTURE  Final   Report Status 01/15/2016 FINAL  Final  Culture, respiratory (NON-Expectorated)     Status: None (Preliminary result)   Collection Time: 01/15/16  8:35 AM  Result Value Ref Range Status   Specimen Description SPUTUM  Final   Special Requests NONE Reflexed from [id number]  Final   Gram Stain   Final    RARE WBC PRESENT, PREDOMINANTLY PMN FEW GRAM POSITIVE COCCI IN PAIRS FEW GRAM NEGATIVE COCCOBACILLI RARE GRAM POSITIVE RODS Performed at [facility]    Culture PENDING  Incomplete   Report Status PENDING  Incomplete     Studies: Dg Chest 2 View  Result Date: 01/14/2016 CLINICAL DATA:  Hypoxemia.  Cough. EXAM: CHEST  2 VIEW COMPARISON:  Radiographs of May 21, 2008. FINDINGS: The heart size and mediastinal contours are within normal limits. Both lungs are clear. No pneumothorax or pleural effusion is noted. Atherosclerosis of thoracic aorta is noted. The visualized skeletal structures are unremarkable. IMPRESSION: No active cardiopulmonary disease. Electronically Signed   By: Marijo Conception, M.D.   On: 01/14/2016 18:19    Scheduled Meds: . [START ON 01/16/2016] aspirin  81 mg Oral QODAY  . [START ON 01/16/2016] azithromycin  250 mg Oral Daily  . enoxaparin (LOVENOX) injection  40 mg Subcutaneous Q24H  . guaiFENesin  600 mg Oral BID  . ipratropium-albuterol  3 mL Nebulization QID  . potassium chloride SA  20 mEq Oral  Daily  . predniSONE  40 mg Oral Q breakfast  . rosuvastatin  10 mg Oral BID  . sodium chloride flush  3 mL Intravenous Q12H   Continuous Infusions:  Active Problems:   Essential hypertension   Acute respiratory failure with hypoxia (HCC)   Influenza   Sleep apnea   Viral bronchitis   Dehydration   Hypokalemia   Influenza B   Hyponatremia    Time spent: 25 minutes    Barton Dubois  [facility] Pager [phone]. If 7PM-7AM, please contact night-coverage at [email or web address], password [facility] 01/15/2016, 5:32 PM  LOS: 0 days

## 2016-01-15 NOTE — Progress Notes (Signed)
Pt's O2 sat is 96% on 2L [place] at rest in the bed, ambulated patient to bathroom off O2, O2 sat dropped to 84% on RA during exertion and patient appeared dyspneic, while out of bed on RA patient's O2 sat ranged from 84% to 87%, once back in bed writer placed pt on 1L [place] O2 which increased O2 to 93%.

## 2016-01-16 DIAGNOSIS — E860 Dehydration: Secondary | ICD-10-CM | POA: Diagnosis not present

## 2016-01-16 DIAGNOSIS — I10 Essential (primary) hypertension: Secondary | ICD-10-CM | POA: Diagnosis not present

## 2016-01-16 DIAGNOSIS — J9601 Acute respiratory failure with hypoxia: Secondary | ICD-10-CM | POA: Diagnosis not present

## 2016-01-16 DIAGNOSIS — E876 Hypokalemia: Secondary | ICD-10-CM | POA: Diagnosis not present

## 2016-01-16 DIAGNOSIS — G4730 Sleep apnea, unspecified: Secondary | ICD-10-CM

## 2016-01-16 LAB — HEMOGLOBIN A1C
HEMOGLOBIN A1C: 5.9 % — AB (ref 4.8–5.6)
Mean Plasma Glucose: 123 mg/dL

## 2016-01-16 MED ORDER — GUAIFENESIN ER 600 MG PO TB12: 600.0000 mg | ORAL_TABLET | Freq: Two times a day (BID) | ORAL | 0 refills | Status: DC

## 2016-01-16 MED ORDER — AZITHROMYCIN 250 MG PO TABS: 250.0000 mg | ORAL_TABLET | Freq: Every day | ORAL | 0 refills | Status: AC

## 2016-01-16 MED ORDER — IPRATROPIUM-ALBUTEROL 20-100 MCG/ACT IN AERS: 1.0000 | INHALATION_SPRAY | Freq: Four times a day (QID) | RESPIRATORY_TRACT | 2 refills | Status: DC | PRN

## 2016-01-16 MED ORDER — PREDNISONE 20 MG PO TABS: ORAL_TABLET | ORAL | 0 refills | Status: DC

## 2016-01-16 NOTE — Progress Notes (Addendum)
SATURATION QUALIFICATIONS: (This note is used to comply with regulatory documentation for home oxygen)  Patient Saturations on Room Air at Rest = 93%  Patient Saturations on Room Air while Ambulating 20 ft = 89%  Patient Saturations on 2 Liters of oxygen while Ambulating 100 ft = 91%  Please briefly explain why patient needs home oxygen:

## 2016-01-16 NOTE — Discharge Summary (Signed)
Physician Discharge Summary  Cassandra James [name] DOB: 1945-02-10 DOA: 01/14/2016  PCP: Cassandra James., DO  Admit date: 01/14/2016 Discharge date: 01/16/2016  Time spent: 35 minutes  Recommendations for Outpatient Follow-up:  Repeat BMET to follow electrolytes and renal function  Reassess BP and adjust antihypertensive regimen as needed Please assure patient has follow up with pulmonary service as instructed Recheck CXR in 3 weeks or so to assure resolution of bronchitic changes and infiltrates  Discharge Diagnoses:  Active Problems:   Essential hypertension   Acute respiratory failure with hypoxia (HCC)   Influenza   Sleep apnea   Viral bronchitis   Dehydration   Hypokalemia   Influenza B   Hyponatremia   Community acquired pneumonia class 1 obesity due to excess calorie intake   Discharge Condition: stable and improved. Discharge home with oxygen supplementation. Will follow up with PCP and with pulmonary service as an outpatient.  Diet recommendation: heart healthy diet and low calorie diet   Filed Weights   01/14/16 2210  Weight: 78.4 kg (172 lb 14.4 oz)    History of present illness:  70 y.o.femalewith medical history significant of HTN, HLD, osteoporosis and Pernacious anemia; who presented with SOB, general malaise, inability to tolerate PO's and weakness. Recently diagnosed with influenza at urgent care (apporx 3-4 days prior to admission) and subsequent worsening symptoms and hypoxia while being assessed at PCP office; At that moment patient was also hypoxic and with increase wheezing. Admitted for further evaluation and treatment of acute resp failure and possible CAP.  Hospital Course:  1-acute resp failure with hypoxia: due to influenza B and bronchitis/CAP -patient will follow up with pulmonary service for PFT's and further pulmonary evaluation -patient with positive strep pneumoniae in urine -will continue steroids tapering and was discharge on  combivent and zithromax  -required oxygen supplementation at discharge (mainly on exertion). -out of therapeutic window for tamiflu and currently afebrile  2-dehydration and hypokalemia -resolved with IVF's and electrolyte repletion    3-essential HTN: -stable -will resume home antihypertensive regimen  4-obesity -Body mass index is 30.15 kg/m. -low calorie and increase exercise discussed with patient   5-HLD -continue statins  -advise to follow heart healthy diet   Procedures:  See below for x-ray reports   Consultations:  None   Discharge Exam: Vitals:   01/16/16 0628 01/16/16 1245  BP: (!) 113/46 137/61  Pulse: 61 77  Resp: 16 19  Temp: 98.1 F (36.7 C) 98.7 F (37.1 C)    General: afebrile and feeling better. Still SOB, with acute desaturation on RA with exertion, mild exp wheezing and some cough. No nausea, no vomiting and no CP. Will like to go home.  Cardiovascular: S1 and S2, no rubs or gallops  Respiratory: improve air movement, some exp wheezing and rhonchi, no crackles, no using accessory muscles.  Abdomen: soft, NT, ND, positive BS  Musculoskeletal: no edema, no cyanosis  Discharge Instructions   Discharge Instructions    Diet - low sodium heart healthy    Complete by:  As directed    Discharge instructions    Complete by:  As directed    Keep yourself well hydrated  Take medications as prescribed  Use oxygen supplementation as instructed Arrange follow up with PCP in 10 days Pulmonary office will contact you with appointment details for PFT's (pulmonary function test) and further evaluation on your breathing   Increase activity slowly    Complete by:  As directed      Current  Discharge Medication List    START taking these medications   Details  azithromycin (ZITHROMAX) 250 MG tablet Take 1 tablet (250 mg total) by mouth daily. Qty: 4 each, Refills: 0    guaiFENesin (MUCINEX) 600 MG 12 hr tablet Take 1 tablet (600 mg total) by  mouth 2 (two) times daily. Qty: 40 tablet, Refills: 0    Ipratropium-Albuterol (COMBIVENT RESPIMAT) 20-100 MCG/ACT AERS respimat Inhale 1 puff into the lungs every 6 (six) hours as needed for wheezing or shortness of breath. Qty: 1 Inhaler, Refills: 2    predniSONE (DELTASONE) 20 MG tablet Take 4 tablets by mouth for 2 days; then 1 tablet by mouth daily for 3 days; then 1/2 tablet by mouth daily for 3 days and stop prednisone Qty: 12 tablet, Refills: 0      CONTINUE these medications which have NOT CHANGED   Details  aspirin 81 MG chewable tablet Chew 81 mg by mouth every other day.    cholecalciferol (VITAMIN D) 1000 units tablet Take 1,000 Units by mouth daily.    cyanocobalamin (,VITAMIN B-12,) 1000 MCG/ML injection Inject 1 mL (1,000 mcg total) into the muscle every 30 (thirty) days. Qty: 1 mL, Refills: 12    hyoscyamine (LEVSIN SL) 0.125 MG SL tablet Place 0.25 mg under the tongue every 4 (four) hours as needed for cramping.    potassium chloride SA (K-DUR,KLOR-CON) 20 MEQ tablet Take 20 mEq by mouth daily.    rosuvastatin (CRESTOR) 20 MG tablet Take 10 mg by mouth 2 (two) times daily.    valsartan-hydrochlorothiazide (DIOVAN-HCT) 320-25 MG tablet Take 1 tablet by mouth daily.      STOP taking these medications     albuterol (PROVENTIL HFA;VENTOLIN HFA) 108 (90 Base) MCG/ACT inhaler        Allergies  Allergen Reactions  . Cephalexin Anaphylaxis  . Cephalosporins Anaphylaxis  . Codeine Nausea And Vomiting   Follow-up Information    Colin Benton R., DO. Schedule an appointment as soon as possible for a visit in 2 week(s).   Specialty:  Family Medicine Contact information: [place] [address] [phone]        Simonne Maffucci, MD Follow up today.   Specialty:  Pulmonary Disease Why:  office will contact you in the next 2-3 weeks for follow up appointment  Contact information: [place] [address] [phone]             The results of significant diagnostics from this hospitalization (including imaging, microbiology, ancillary and laboratory) are listed below for reference.    Significant Diagnostic Studies: Dg Chest 2 View  Result Date: 01/14/2016 CLINICAL DATA:  Hypoxemia.  Cough. EXAM: CHEST  2 VIEW COMPARISON:  Radiographs of May 21, 2008. FINDINGS: The heart size and mediastinal contours are within normal limits. Both lungs are clear. No pneumothorax or pleural effusion is noted. Atherosclerosis of thoracic aorta is noted. The visualized skeletal structures are unremarkable. IMPRESSION: No active cardiopulmonary disease. Electronically Signed   By: Marijo Conception, M.D.   On: 01/14/2016 18:19    Microbiology: Recent Results (from the past 240 hour(s))  Culture, blood (routine x 2) Call MD if unable to obtain prior to antibiotics being given     Status: None (Preliminary result)   Collection Time: 01/14/16 10:18 PM  Result Value Ref Range Status   Specimen Description BLOOD LEFT ANTECUBITAL  Final   Special Requests BOTTLES DRAWN AEROBIC AND ANAEROBIC 5CC  Final   Culture  Final    NO GROWTH < 24 HOURS Performed at [facility]    Report Status PENDING  Incomplete  Culture, blood (routine x 2) Call MD if unable to obtain prior to antibiotics being given     Status: None (Preliminary result)   Collection Time: 01/14/16 10:22 PM  Result Value Ref Range Status   Specimen Description BLOOD LEFT HAND  Final   Special Requests   Final    BOTTLES DRAWN AEROBIC AND ANAEROBIC 5ML ANAEROBE BOTTLE NOT INCUBATED APPEARS CONTAMINATED   Culture   Final    NO GROWTH < 24 HOURS Performed at [facility]    Report Status PENDING  Incomplete  Culture, sputum-assessment     Status: None   Collection Time: 01/15/16  8:35 AM  Result Value Ref Range Status   Specimen Description SPUTUM  Final   Special Requests NONE  Final   Sputum evaluation THIS SPECIMEN IS ACCEPTABLE FOR SPUTUM CULTURE   Final   Report Status 01/15/2016 FINAL  Final  Culture, respiratory (NON-Expectorated)     Status: None (Preliminary result)   Collection Time: 01/15/16  8:35 AM  Result Value Ref Range Status   Specimen Description SPUTUM  Final   Special Requests NONE Reflexed from [id number]  Final   Gram Stain   Final    RARE WBC PRESENT, PREDOMINANTLY PMN FEW GRAM POSITIVE COCCI IN PAIRS FEW GRAM NEGATIVE COCCOBACILLI RARE GRAM POSITIVE RODS    Culture   Final    CULTURE REINCUBATED FOR BETTER GROWTH Performed at [facility]    Report Status PENDING  Incomplete     Labs: Basic Metabolic Panel:  Recent Labs Lab 01/14/16 1754 01/15/16 0533  NA 132* 137  K 3.3* 4.0  CL 95* 102  CO2 27 25  GLUCOSE 160* 140*  BUN 31* 29*  CREATININE 0.79 0.63  CALCIUM 8.2* 7.8*  MG  --  2.3  PHOS  --  4.0   Liver Function Tests:  Recent Labs Lab 01/15/16 0533  AST 33  ALT 33  ALKPHOS 43  BILITOT 0.5  PROT 6.4*  ALBUMIN 3.5   No results for input(s): LIPASE, AMYLASE in the last 168 hours. No results for input(s): AMMONIA in the last 168 hours. CBC:  Recent Labs Lab 01/14/16 1754 01/15/16 0533  WBC 4.6 5.0  NEUTROABS 3.1  --   HGB 14.2 13.7  HCT 43.1 41.4  MCV 84.0 84.0  PLT 171 190   Cardiac Enzymes: No results for input(s): CKTOTAL, CKMB, CKMBINDEX, TROPONINI in the last 168 hours. BNP: BNP (last 3 results) No results for input(s): BNP in the last 8760 hours.  ProBNP (last 3 results) No results for input(s): PROBNP in the last 8760 hours.  CBG: No results for input(s): GLUCAP in the last 168 hours.     Signed:  Barton Dubois MD.  [facility] 01/16/2016, 1:56 PM

## 2016-01-16 NOTE — Progress Notes (Signed)
SATURATION QUALIFICATIONS: (This note is used to comply with regulatory documentation for home oxygen)  Patient Saturations on Room Air at Rest = 93%  Patient Saturations on Room Air while Ambulating = 83%  Patient Saturations on 2 Liters of oxygen while Ambulating = 93%  Please briefly explain why patient needs home oxygen: 

## 2016-01-16 NOTE — Progress Notes (Signed)
Patient discharged home. Discharge instructions discussed with patient and husband. They voice understanding of all. 2 prescriptions given. Stressed importance of completing antibiotics, using oxygen continuously @ 2lpm, and following up with PCP and pulmonary as directed by Dr. Dyann Kief. States understanding. IV access removed. [facility] arranged home O2 and provided transport tank for patient to take home. Pt discharged in stable condition, in no distress, with stable vitals on oxygen at 2lpm. Taken via wheelchair by NT to personal vehicle in care of husband with destination of home.

## 2016-01-16 NOTE — Care Management Note (Signed)
Case Management Note  Patient Details  Name: Cassandra James MRN: [id number] Date of Birth: October 21, 1945  Subjective/Objective:   Acute resp failure with hypoxia, CAP, influenza B                Action/Plan: Discharge Planning: AVS reviewed:  NCM spoke to pt and lives at home with husband. Contacted [facility] for Neb machine and oxygen for home. Explained to pt that [facility] will bring portable to room and concentrator and neb machine to her home this evening.   PCP Lucretia Kern MD   Expected Discharge Date: 12/18/1205               Expected Discharge Plan:  Home/Self Care  In-House Referral:  NA  Discharge planning Services  CM Consult  Post Acute Care Choice:  NA Choice offered to:  NA  DME Arranged:  Oxygen DME Agency:  [place] Arranged:  NA [place] Agency:  [place]  Status of Service:  Completed, signed off  If discussed at [place] of Stay Meetings, dates discussed:    Additional Comments:  Erenest Rasher, RN 01/16/2016, 2:09 PM

## 2016-01-17 LAB — CULTURE, RESPIRATORY

## 2016-01-17 LAB — CULTURE, RESPIRATORY W GRAM STAIN: Culture: NORMAL

## 2016-01-18 ENCOUNTER — Encounter: Payer: Self-pay | Admitting: Family Medicine

## 2016-01-18 NOTE — Telephone Encounter (Signed)
Dr. Maudie Mercury - FYI pt's appt 02/02/16 with Dr Melvyn Novas is a follow up only, no testing scheduled for that day. If she needs PFT they will schedule that for another day. Her appt with you is not until 03/05/16. Just wanted to make sure you were aware. She is on my TCM list so I will be calling her for that follow up.

## 2016-01-19 ENCOUNTER — Telehealth: Payer: Self-pay

## 2016-01-19 LAB — CULTURE, BLOOD (ROUTINE X 2)
Culture: NO GROWTH
Culture: NO GROWTH

## 2016-01-19 NOTE — Telephone Encounter (Signed)
D/C 01/16/16 To: home   Spoke with pt and she reports continued ShOB. She has received her home oxygen and that is helping. She also received a nebulizer but no medications. Advised she should call pulmonary to check on this.   Appt scheduled with Dr Maudie Mercury 01/27/16. Pt aware.     Transition Care Management Follow-up Telephone Call  How have you been since you were released from the hospital? Improving some   Do you understand why you were in the hospital? yes   Do you understand the discharge instrcutions? yes  Items Reviewed:  Medications reviewed: yes  Allergies reviewed: yes  Dietary changes reviewed: yes  Referrals reviewed: yes   Functional Questionnaire:   Activities of Daily Living (ADLs):   She states they are independent in the following: ambulation, bathing and hygiene, feeding, continence, grooming, toileting and dressing States they require assistance with the following: none   Any transportation issues/concerns?: no   Any patient concerns? no   Confirmed importance and date/time of follow-up visits scheduled: yes   Confirmed with patient if condition begins to worsen call PCP or go to the ER.  Patient was given the Call-a-Nurse line [phone]: yes

## 2016-01-26 NOTE — Progress Notes (Signed)
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 See phone note. Hospitalized: 12/29-12/31/2017  Primary dx and treatment: 1)Influenza B with hypoxic resp failure and CAP -treated with O2, steroids, abx (zithromax)  -seeing pulm for follow up and testing for underlying undiagnosed COPD or asthma  Complications or other Dx and treatment: 1) Dehydration with hypokalemia: -treated with IVFs/electrolyte replacement  Changes in outpatient medications: Follow up instructions per discharge summary: -BMP, recheck BP, ensure pt has pulmonology f/u, CXR for resolution  Patient reports: She is doing much better, breathing much improved, resolution N/V cough much better, fevers resolved, strength improving, has pulmonology appointment next week, no longer using O2   ROS: See pertinent positives and negatives per HPI.  Past Medical History:  Diagnosis Date  . Cataract   . Colon polyps    history of  . COLONIC POLYPS, HX OF 08/09/2006   Qualifier: Diagnosis of  By: Scherrie Gerlach    . DECREASED HEARING 11/25/2008   Qualifier: Diagnosis of  By: Scherrie Gerlach    . Elevated LFTs    obstructive disease  . HIATAL HERNIA 02/05/2009   Qualifier: Diagnosis of  By: Harriet Pho    . Hyperglycemia 11/10/2013  . Hyperlipidemia   . Hypertension   . Osteoporosis    was osteopenia-last bone scan all clear   . Pernicious anemia   . Sleep apnea 01/14/2016    Past Surgical History:  Procedure Laterality Date  . [place] RELEASE Left 2016  . cataract surgery  2011   Bilateral  . COLONOSCOPY    . HAND TENDON SURGERY  01/16/2014   Dr Burney Gauze  . OVARIAN CYST SURGERY  1968  . RETINAL DETACHMENT SURGERY  2011   left  . TOENAIL EXCISION Right    Dr Patrick Jupiter great toenail excision  .  TRAPEZIUM RESECTION Left 2016   left wrist   . TUBAL LIGATION  1979    Family History  Problem Relation Age of Onset  . Pernicious anemia Mother   . Cancer Father     lung/ bone  . Osteoporosis Father   . Hypothyroidism Father   . Colon cancer Neg Hx   . Colon polyps Neg Hx   . Rectal cancer Neg Hx   . Stomach cancer Neg Hx     Social History   Social History  . Marital status: Married    Spouse name: N/A  . Number of children: N/A  . Years of education: N/A   Social History Main Topics  . Smoking status: Former Smoker    Quit date: 01/17/2000  . Smokeless tobacco: Never Used  . Alcohol use 1.2 - 1.8 oz/week    2 - 3 Standard drinks or equivalent per week     Comment: 3 times a week   . Drug use: No  . Sexual activity: Not Asked   Other Topics Concern  . None   Social History Narrative   Work or School: Therapist, sports, does flu shots and wellness clinics for [employer], husband was physician      Home Situation: lives with husband - he sees me as well      Spiritual Beliefs: none      Lifestyle: no regular exercise; diet is good  Current Outpatient Prescriptions:  .  aspirin 81 MG chewable tablet, Chew 81 mg by mouth every other day., Disp: , Rfl:  .  cholecalciferol (VITAMIN D) 1000 units tablet, Take 1,000 Units by mouth daily., Disp: , Rfl:  .  cyanocobalamin (,VITAMIN B-12,) 1000 MCG/ML injection, Inject 1 mL (1,000 mcg total) into the muscle every 30 (thirty) days., Disp: 1 mL, Rfl: 12 .  guaiFENesin (MUCINEX) 600 MG 12 hr tablet, Take 1 tablet (600 mg total) by mouth 2 (two) times daily., Disp: 40 tablet, Rfl: 0 .  hyoscyamine (LEVSIN SL) 0.125 MG SL tablet, Place 0.25 mg under the tongue every 4 (four) hours as needed for cramping., Disp: , Rfl:  .  Ipratropium-Albuterol (COMBIVENT RESPIMAT) 20-100 MCG/ACT AERS respimat, Inhale 1 puff into the lungs every 6 (six) hours as needed for wheezing or shortness of breath., Disp: 1 Inhaler, Rfl: 2 .  potassium  chloride SA (K-DUR,KLOR-CON) 20 MEQ tablet, Take 20 mEq by mouth daily., Disp: , Rfl:  .  predniSONE (DELTASONE) 20 MG tablet, Take 4 tablets by mouth for 2 days; then 1 tablet by mouth daily for 3 days; then 1/2 tablet by mouth daily for 3 days and stop prednisone, Disp: 12 tablet, Rfl: 0 .  rosuvastatin (CRESTOR) 20 MG tablet, Take 10 mg by mouth 2 (two) times daily., Disp: , Rfl:  .  valsartan-hydrochlorothiazide (DIOVAN-HCT) 320-25 MG tablet, Take 1 tablet by mouth daily., Disp: , Rfl:   EXAM:  Vitals:   01/27/16 1111  BP: 100/60  Pulse: 69  Temp: 97.7 F (36.5 C)    Body mass index is 29.83 kg/m.  GENERAL: vitals reviewed and listed above, alert, oriented, appears well hydrated and in no acute distress  HEENT: atraumatic, conjunttiva clear, no obvious abnormalities on inspection of external nose and ears  NECK: no obvious masses on inspection  LUNGS: clear to auscultation bilaterally, no wheezes, rales or rhonchi, good air movement  CV: HRRR, no peripheral edema  MS: moves all extremities without noticeable abnormality  PSYCH: pleasant and cooperative, no obvious depression or anxiety  ASSESSMENT AND PLAN:  Discussed the following assessment and plan:  Influenza with pneumonia  Community acquired pneumonia, unspecified laterality  Acute respiratory failure with hypoxia (HCC)  Dehydration  Hypokalemia - Plan: Basic metabolic panel  -she look great in comparison to her last visit, so happy she is recovering well -will check BMP -BP on low side, but she feels fine, will plan to monitor as she recovers -has pulm appointment next week -discussed f/u CXR - she plans to discuss with pulm, asked her to call if she wishes for [place] to order -Patient advised to return or notify a doctor immediately if symptoms worsen or persist or new concerns arise.  Patient Instructions  BEFORE YOU LEAVE: -lab  Keep follow up as scheduled.  See the pulmonologist as planned and  please let me know if you would like for me to order the repeat chest xray in several weeks if not done with the pulmonologist.    Lucretia Kern., DO

## 2016-01-27 ENCOUNTER — Encounter: Payer: Self-pay | Admitting: Family Medicine

## 2016-01-27 ENCOUNTER — Ambulatory Visit (INDEPENDENT_AMBULATORY_CARE_PROVIDER_SITE_OTHER): Payer: Medicare HMO | Admitting: Family Medicine

## 2016-01-27 VITALS — BP 100/60 | HR 69 | Temp 97.7°F | Ht 63.5 in | Wt 171.1 lb

## 2016-01-27 DIAGNOSIS — E860 Dehydration: Secondary | ICD-10-CM

## 2016-01-27 DIAGNOSIS — E876 Hypokalemia: Secondary | ICD-10-CM | POA: Diagnosis not present

## 2016-01-27 DIAGNOSIS — J9601 Acute respiratory failure with hypoxia: Secondary | ICD-10-CM | POA: Diagnosis not present

## 2016-01-27 DIAGNOSIS — J1100 Influenza due to unidentified influenza virus with unspecified type of pneumonia: Secondary | ICD-10-CM | POA: Diagnosis not present

## 2016-01-27 DIAGNOSIS — J189 Pneumonia, unspecified organism: Secondary | ICD-10-CM

## 2016-01-27 LAB — BASIC METABOLIC PANEL
BUN: 24 mg/dL — ABNORMAL HIGH (ref 6–23)
CALCIUM: 9.5 mg/dL (ref 8.4–10.5)
CO2: 31 meq/L (ref 19–32)
CREATININE: 0.86 mg/dL (ref 0.40–1.20)
Chloride: 99 mEq/L (ref 96–112)
GFR: 68.85 mL/min (ref >60.00)
GLUCOSE: 107 mg/dL — AB (ref 70–99)
Potassium: 4.3 mEq/L (ref 3.5–5.1)
SODIUM: 138 meq/L (ref 135–145)

## 2016-01-27 NOTE — Patient Instructions (Signed)
BEFORE YOU LEAVE: -lab  Keep follow up as scheduled.  See the pulmonologist as planned and please let me know if you would like for me to order the repeat chest xray in several weeks if not done with the pulmonologist.

## 2016-01-27 NOTE — Progress Notes (Signed)
Pre visit review using our clinic review tool, if applicable. No additional management support is needed unless otherwise documented below in the visit note. 

## 2016-02-01 ENCOUNTER — Ambulatory Visit: Payer: Medicare HMO | Admitting: Family Medicine

## 2016-02-02 ENCOUNTER — Inpatient Hospital Stay: Payer: Medicare HMO | Admitting: Internal Medicine

## 2016-02-04 ENCOUNTER — Ambulatory Visit (INDEPENDENT_AMBULATORY_CARE_PROVIDER_SITE_OTHER): Payer: Medicare HMO | Admitting: Internal Medicine

## 2016-02-04 ENCOUNTER — Encounter: Payer: Self-pay | Admitting: Internal Medicine

## 2016-02-04 VITALS — BP 114/70 | HR 79 | Ht 64.0 in | Wt 172.0 lb

## 2016-02-04 DIAGNOSIS — J449 Chronic obstructive pulmonary disease, unspecified: Secondary | ICD-10-CM

## 2016-02-04 DIAGNOSIS — J9601 Acute respiratory failure with hypoxia: Secondary | ICD-10-CM

## 2016-02-04 NOTE — Progress Notes (Signed)
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 lived in [place] since 2006 where moved from [place] where exposed 2003  to cleaning solution of ammonia/bleach admitted only overnight and since then doe x steps/boxes with occ bronchitis prn saba then admit:    Admit date: 01/14/2016 Discharge date: 01/16/2016  Recommendations for Outpatient Follow-up:  Repeat BMET to follow electrolytes and renal function  Reassess BP and adjust antihypertensive regimen as needed Please assure patient has follow up with pulmonary service as instructed Recheck CXR in 3 weeks or so to assure resolution of bronchitic changes and infiltrates  Discharge Diagnoses:  Active Problems:   Essential hypertension   Acute respiratory failure with hypoxia (HCC)   Influenza   Sleep apnea   Viral bronchitis   Dehydration   Hypokalemia   Influenza B   Hyponatremia   Community acquired pneumonia class 1 obesity due to excess calorie intake   Discharge Condition: stable and improved. Discharge home with oxygen supplementation. Will follow up with PCP and with pulmonary service as an outpatient.  Diet recommendation: heart healthy diet and low calorie diet      Filed Weights   01/14/16 2210  Weight: 78.4 kg (172 lb 14.4 oz)    History of present illness:  71 y.o.femalewith medical history significant of HTN, HLD,osteoporosis and Pernacious anemia; who presented with SOB, general malaise, inability to tolerate PO's and weakness. Recently diagnosed with influenza at urgent care (apporx 3-4 days prior to admission) and subsequent worsening symptoms and hypoxia while being assessed at PCP office; At that moment patient was also hypoxic and with increase wheezing. Admitted for further evaluation and treatment of acute resp failure and possible CAP.  Hospital Course:  1-acute resp failure with hypoxia: due to influenza B  and bronchitis/CAP -patient will follow up with pulmonary service for PFT's and further pulmonary evaluation -patient with positive strep pneumoniae in urine -will continue steroids tapering and was discharge on combivent and zithromax  -required oxygen supplementation at discharge (mainly on exertion). -out of therapeutic window for tamiflu and currently afebrile  2-dehydration and hypokalemia -resolved with IVF's and electrolyte repletion    3-essential HTN: -stable -will resume home antihypertensive regimen  4-obesity -Body mass index is 30.15 kg/m. -low calorie and increase exercise discussed with patient   5-HLD -continue statins  -advise to follow heart healthy diet     02/04/2016  f/u ov/Xayden Linsey re: on 02  Chief Complaint  Patient presents with  . Pulmonary Consult    Referred by Hospital. Pt states had PNA and Influzenza recently. She states having SOB with walking up stairs or carrying something heavy. She was discharged from the hospital on o2, but only used this for approx 5 days.   she is almost back to her baseline, not sensing need for combivent at this point, sleeping well also but not using any 02 as rec   No obvious day to day or daytime variability or assoc excess/ purulent sputum or mucus plugs or hemoptysis or cp or chest tightness, subjective wheeze or overt sinus or hb symptoms. No unusual exp hx or h/o childhood pna/ asthma or knowledge of premature birth.  Sleeping ok without nocturnal  or early am exacerbation  of respiratory  c/o's or need for noct saba. Also denies any obvious fluctuation of symptoms with weather or environmental changes or other aggravating or alleviating factors except as outlined above  Current Medications, Allergies, Complete Past Medical History, Past Surgical History, Family History, and Social History were reviewed in [employer] record.  ROS  The following are not active complaints unless bolded sore  throat, dysphagia, dental problems, itching, sneezing,  nasal congestion or excess/ purulent secretions, ear ache,   fever, chills, sweats, unintended wt loss, classically pleuritic or exertional cp,  orthopnea pnd or leg swelling, presyncope, palpitations, abdominal pain, anorexia, nausea, vomiting, diarrhea  or change in bowel or bladder habits, change in stools or urine, dysuria,hematuria,  rash, arthralgias, visual complaints, headache, numbness, weakness or ataxia or problems with walking or coordination,  change in mood/affect or memory.              Review of Systems     Objective:   Physical Exam    amb pleasant wf nad  Wt Readings from Last 3 Encounters:  02/04/16 172 lb (78 kg)  01/27/16 171 lb 1.6 oz (77.6 kg)  01/14/16 172 lb 14.4 oz (78.4 kg)    Vital signs reviewed - Note on arrival 02 sats  96% on RA     HEENT: nl dentition,   and oropharynx. Nl external ear canals without cough reflex - moderate bilateral non-specific turbinate edema     NECK :  without JVD/Nodes/TM/ nl carotid upstrokes bilaterally   LUNGS: no acc muscle use,  Nl contour chest with distant bs/  Mild increased exp time, minimal exp wheeze bilaterally    CV:  RRR  no s3 or murmur or increase in P2, nad no edema   ABD:  soft and nontender with nl inspiratory excursion in the supine position. No bruits or organomegaly appreciated, bowel sounds nl  MS:  Nl gait/ ext warm without deformities, calf tenderness, cyanosis or clubbing No obvious joint restrictions   SKIN: warm and dry without lesions    NEURO:  alert, approp, nl sensorium with  no motor or cerebellar deficits apparent.        I personally reviewed images and agree with radiology impression as follows:  CXR:   01/14/16 No active cardiopulmonary disease. Assessment:

## 2016-02-04 NOTE — Patient Instructions (Addendum)
Ok to stop all 02 at this point  Work on maintaining perfect  inhaler technique:  relax and gently blow all the way out then take a nice smooth deep breath back in, triggering the inhaler at same time you start breathing in.  Hold for up to 5 seconds if you can   Only use your combivent l as a rescue medication to be used if you can't catch your breath by resting or doing a relaxed purse lip breathing pattern.  - The less you use it, the better it will work when you need it. - Ok to use up to 1 puffs  every 4 hours if you must but call for immediate appointment if use goes up over your usual need - Don't leave home without it !!  (think of it like the spare tire for your car)   Please schedule a follow up visit in 3 months but call sooner if needed with full pfts

## 2016-02-05 NOTE — Assessment & Plan Note (Signed)
Resolved/ ok to d/c 02

## 2016-02-05 NOTE — Assessment & Plan Note (Signed)
Spirometry 02/04/2016  FEV1 0.94  (43%)  Ratio 58    As I explained to this patient in detail:  although there is clearly at least moderately severe copd present, it may not be clinically relevant:   it does not appear to be limiting activity tolerance any more than a set of worn tires limits someone from driving a car  around a parking lot.  A new set of Michelins might look good but would have no perceived impact on the performance of the car and would not be worth the cost.    I don't recommend aggressive pulmonary rx at this point unless limiting symptoms arise or acute exacerbations become as issue, neither of which is the case now.  I asked the patient to contact this office at any time in the future should either of these problems arise.    In meantime: - The proper method of use, as well as anticipated side effects, of a respimat  inhaler were discussed and demonstrated to the patient.    Total time devoted to counseling  > 50 % of 60 min new pt office visit:  review case including extensive hopsital records  with pt/ discussion of options/alternatives/ personally creating written customized instructions  in presence of pt  then going over those specific  Instructions directly with the pt including how to use all of the meds but in particular covering each new medication in detail and the difference between the maintenance/automatic meds and the prns using an action plan format for the latter.  Please see AVS from this visit for a full list of these instructions which I personally wrote for this pt and  are unique to this visit.

## 2016-02-21 ENCOUNTER — Ambulatory Visit (INDEPENDENT_AMBULATORY_CARE_PROVIDER_SITE_OTHER): Payer: Medicare HMO | Admitting: Family Medicine

## 2016-02-21 ENCOUNTER — Encounter: Payer: Self-pay | Admitting: Family Medicine

## 2016-02-21 VITALS — BP 90/50 | HR 69 | Temp 98.3°F | Ht 64.0 in | Wt 171.0 lb

## 2016-02-21 DIAGNOSIS — R059 Cough, unspecified: Secondary | ICD-10-CM

## 2016-02-21 DIAGNOSIS — R05 Cough: Secondary | ICD-10-CM | POA: Diagnosis not present

## 2016-02-21 DIAGNOSIS — E538 Deficiency of other specified B group vitamins: Secondary | ICD-10-CM | POA: Diagnosis not present

## 2016-02-21 DIAGNOSIS — B9789 Other viral agents as the cause of diseases classified elsewhere: Secondary | ICD-10-CM

## 2016-02-21 DIAGNOSIS — J069 Acute upper respiratory infection, unspecified: Secondary | ICD-10-CM

## 2016-02-21 MED ORDER — BENZONATATE 100 MG PO CAPS: 100.0000 mg | ORAL_CAPSULE | Freq: Three times a day (TID) | ORAL | 0 refills | Status: DC | PRN

## 2016-02-21 MED ORDER — CYANOCOBALAMIN 1000 MCG/ML IJ SOLN: 1000.0000 ug | INTRAMUSCULAR | 12 refills | Status: DC

## 2016-02-21 NOTE — Progress Notes (Signed)
Pre visit review using our clinic review tool, if applicable. No additional management support is needed unless otherwise documented below in the visit note. 

## 2016-02-21 NOTE — Patient Instructions (Signed)
BEFORE YOU LEAVE: -follow up: 1) cancel follow up with Dr. Maudie Mercury this month and schedule Medicare visit with Manuela Schwartz in next 1 month 2) follow up with Dr. Maudie Mercury in 3-4 months  Take the tessalon for cough as needed.   INSTRUCTIONS FOR UPPER RESPIRATORY INFECTION:  -plenty of rest and fluids  -nasal saline wash 2-3 times daily (use prepackaged nasal saline or bottled/distilled water if making your own)   -can use flonase if you feel it helps  -in the winter time, using a humidifier at night is helpful (please follow cleaning instructions)  -if you are taking a cough medication - use only as directed, may also try a teaspoon of honey to coat the throat and throat lozenges.   -for sore throat, salt water gargles can help  -follow up if you have fevers, facial pain, tooth pain, difficulty breathing or are worsening or symptoms persist longer then expected  Upper Respiratory Infection, Adult An upper respiratory infection (URI) is also known as the common cold. It is often caused by a type of germ (virus). Colds are easily spread (contagious). You can pass it to others by kissing, coughing, sneezing, or drinking out of the same glass. Usually, you get better in 1 to 3  weeks.  However, the cough can last for even longer. HOME CARE   Only take medicine as told by your doctor. Follow instructions provided above.  Drink enough water and fluids to keep your pee (urine) clear or pale yellow.  Get plenty of rest.  Return to work when your temperature is < 100 for 24 hours or as told by your doctor. You may use a face mask and wash your hands to stop your cold from spreading. GET HELP RIGHT AWAY IF:   After the first few days, you feel you are getting worse.  You have questions about your medicine.  You have chills, shortness of breath, or red spit (mucus).  You have pain in the face for more then 1-2 days, especially when you bend forward.  You have a fever, puffy (swollen) neck, pain when  you swallow, or white spots in the back of your throat.  You have a bad headache, ear pain, sinus pain, or chest pain.  You have a high-pitched whistling sound when you breathe in and out (wheezing).  You cough up blood.  You have sore muscles or a stiff neck. MAKE SURE YOU:   Understand these instructions.  Will watch your condition.  Will get help right away if you are not doing well or get worse. Document Released: 06/21/2007 Document Revised: 03/27/2011 Document Reviewed: 04/09/2013 [facility] Patient Information 2015 [place], [place]. This information is not intended to replace advice given to you by your health care provider. Make sure you discuss any questions you have with your health care provider.

## 2016-02-21 NOTE — Progress Notes (Signed)
HPI:  Cough and congestion: -completely recovered from the flu then reports got a cold last week with sneezing, runny eyes, nasal congestion and cough -started: last week -symptoms:nasal congestion, sore throat, cough - clear mucus -denies:fever, SOB, NVD, tooth pain, body ache, malaise -has tried:  -sick contacts/travel/risks: no reported flu, strep or tick exposure -Hx of: ? Chronic bronchitis - seeing Dr. Melvyn Novas - inhalers don't help per her reports  Wants refill on B12  ROS: See pertinent positives and negatives per HPI.  Past Medical History:  Diagnosis Date  . Cataract   . Colon polyps    history of  . COLONIC POLYPS, HX OF 08/09/2006   Qualifier: Diagnosis of  By: Scherrie Gerlach    . DECREASED HEARING 11/25/2008   Qualifier: Diagnosis of  By: Scherrie Gerlach    . Elevated LFTs    obstructive disease  . HIATAL HERNIA 02/05/2009   Qualifier: Diagnosis of  By: Harriet Pho    . Hyperglycemia 11/10/2013  . Hyperlipidemia   . Hypertension   . Osteoporosis    was osteopenia-last bone scan all clear   . Pernicious anemia   . Sleep apnea 01/14/2016    Past Surgical History:  Procedure Laterality Date  . [place] RELEASE Left 2016  . cataract surgery  2011   Bilateral  . COLONOSCOPY    . HAND TENDON SURGERY  01/16/2014   Dr Burney Gauze  . OVARIAN CYST SURGERY  1968  . RETINAL DETACHMENT SURGERY  2011   left  . TOENAIL EXCISION Right    Dr Patrick Jupiter great toenail excision  . TRAPEZIUM RESECTION Left 2016   left wrist   . TUBAL LIGATION  1979    Family History  Problem Relation Age of Onset  . Pernicious anemia Mother   . Cancer Father     lung/ bone  . Osteoporosis Father   . Hypothyroidism Father   . Colon cancer Neg Hx   . Colon polyps Neg Hx   . Rectal cancer Neg Hx   . Stomach cancer Neg Hx     Social History   Social History  . Marital status: Married    Spouse name: N/A  . Number of children: N/A    . Years of education: N/A   Social History Main Topics  . Smoking status: Former Smoker    Quit date: 01/17/2000  . Smokeless tobacco: Never Used  . Alcohol use 1.2 - 1.8 oz/week    2 - 3 Standard drinks or equivalent per week     Comment: 3 times a week   . Drug use: No  . Sexual activity: Not Asked   Other Topics Concern  . 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  aspirin 81 MG chewable tablet, Chew 81 mg by mouth every other day., Disp: , Rfl:  .  cholecalciferol (VITAMIN D) 1000 units tablet, Take 1,000 Units by mouth daily., Disp: , Rfl:  .  cyanocobalamin (,VITAMIN B-12,) 1000 MCG/ML injection, Inject 1 mL (1,000 mcg total) into  the muscle every 30 (thirty) days., Disp: 1 mL, Rfl: 12 .  hyoscyamine (LEVSIN SL) 0.125 MG SL tablet, Place 0.25 mg under the tongue every 4 (four) hours as needed for cramping., Disp: , Rfl:  .  Ipratropium-Albuterol (COMBIVENT RESPIMAT) 20-100 MCG/ACT AERS respimat, Inhale 1 puff into the lungs every 6 (six) hours as needed for wheezing or shortness of breath., Disp: 1 Inhaler, Rfl: 2 .  potassium chloride SA (K-DUR,KLOR-CON) 20 MEQ tablet, Take 20 mEq by mouth daily., Disp: , Rfl:  .  rosuvastatin (CRESTOR) 20 MG tablet, Take 10 mg by mouth daily. , Disp: , Rfl:  .  valsartan-hydrochlorothiazide (DIOVAN-HCT) 320-25 MG tablet, Take 1 tablet by mouth daily., Disp: , Rfl:  .  benzonatate (TESSALON PERLES) 100 MG capsule, Take 1 capsule (100 mg total) by mouth 3 (three) times daily as needed for cough., Disp: 20 capsule, Rfl: 0  EXAM:  Vitals:   02/21/16 1523  BP: (!) 90/50  Pulse: 69  Temp: 98.3 F (36.8 C)    Body mass index is 29.35 kg/m.  GENERAL: vitals  reviewed and listed above, alert, oriented, appears well hydrated and in no acute distress  HEENT: atraumatic, conjunttiva clear, no obvious abnormalities on inspection of external nose and ears, normal appearance of ear canals and TMs, clear nasal congestion, mild post oropharyngeal erythema with PND, no tonsillar edema or exudate, no sinus TTP  NECK: no obvious masses on inspection  LUNGS: clear to auscultation bilaterally, no wheezes, rales or rhonchi, good air movement  CV: HRRR, no peripheral edema  MS: moves all extremities without noticeable abnormality  PSYCH: pleasant and cooperative, no obvious depression or anxiety  ASSESSMENT AND PLAN:  Discussed the following assessment and plan:  Cough  Viral upper respiratory illness  B12 deficiency  -given HPI and exam findings today, a serious infection or illness is unlikely. We discussed potential etiologies, with VURI being most likely, and advised supportive care and monitoring. We will try the tessalon perles for the cough. We discussed treatment side effects, likely course, antibiotic misuse, transmission, and signs of developing a serious illness. -of course, we advised to return or notify a doctor immediately if symptoms worsen or persist or new concerns arise.    Patient Instructions  BEFORE YOU LEAVE: -follow up: 1) cancel follow up with Dr. Maudie Mercury this month and schedule Medicare visit with Manuela Schwartz in next 1 month 2) follow up with Dr. Maudie Mercury in 3-4 months  Take the tessalon for cough as needed.   INSTRUCTIONS FOR UPPER RESPIRATORY INFECTION:  -plenty of rest and fluids  -nasal saline wash 2-3 times daily (use prepackaged nasal saline or bottled/distilled water if making your own)   -can use flonase if you feel it helps  -in the winter time, using a humidifier at night is helpful (please follow cleaning instructions)  -if you are taking a cough medication - use only as directed, may also try a teaspoon of honey to  coat the throat and throat lozenges.   -for sore throat, salt water gargles can help  -follow up if you have fevers, facial pain, tooth pain, difficulty breathing or are worsening or symptoms persist longer then expected  Upper Respiratory Infection, Adult An upper respiratory infection (URI) is also known as the common cold. It is often caused by a type of germ (virus). Colds are easily spread (contagious). You can pass it to others by kissing, coughing, sneezing, or drinking out of the same glass. Usually, you get better in 1  to 3  weeks.  However, the cough can last for even longer. HOME CARE   Only take medicine as told by your doctor. Follow instructions provided above.  Drink enough water and fluids to keep your pee (urine) clear or pale yellow.  Get plenty of rest.  Return to work when your temperature is < 100 for 24 hours or as told by your doctor. You may use a face mask and wash your hands to stop your cold from spreading. GET HELP RIGHT AWAY IF:   After the first few days, you feel you are getting worse.  You have questions about your medicine.  You have chills, shortness of breath, or red spit (mucus).  You have pain in the face for more then 1-2 days, especially when you bend forward.  You have a fever, puffy (swollen) neck, pain when you swallow, or white spots in the back of your throat.  You have a bad headache, ear pain, sinus pain, or chest pain.  You have a high-pitched whistling sound when you breathe in and out (wheezing).  You cough up blood.  You have sore muscles or a stiff neck. MAKE SURE YOU:   Understand these instructions.  Will watch your condition.  Will get help right away if you are not doing well or get worse. Document Released: 06/21/2007 Document Revised: 03/27/2011 Document Reviewed: 04/09/2013 [facility] Patient Information 2015 [place], [place]. This information is not intended to replace advice given to you by your health care provider.  Make sure you discuss any questions you have with your health care provider.    Colin Benton R., DO

## 2016-03-02 ENCOUNTER — Ambulatory Visit: Payer: Medicare HMO | Admitting: Family Medicine

## 2016-03-03 ENCOUNTER — Other Ambulatory Visit: Payer: Self-pay | Admitting: *Deleted

## 2016-03-03 MED ORDER — VALSARTAN-HYDROCHLOROTHIAZIDE 320-25 MG PO TABS: 1.0000 | ORAL_TABLET | Freq: Every day | ORAL | 1 refills | Status: DC

## 2016-03-03 NOTE — Telephone Encounter (Signed)
Rx done. 

## 2016-03-08 DIAGNOSIS — Z6830 Body mass index (BMI) 30.0-30.9, adult: Secondary | ICD-10-CM | POA: Diagnosis not present

## 2016-03-08 DIAGNOSIS — Z01419 Encounter for gynecological examination (general) (routine) without abnormal findings: Secondary | ICD-10-CM | POA: Diagnosis not present

## 2016-03-08 DIAGNOSIS — Z1231 Encounter for screening mammogram for malignant neoplasm of breast: Secondary | ICD-10-CM | POA: Diagnosis not present

## 2016-03-16 DIAGNOSIS — R69 Illness, unspecified: Secondary | ICD-10-CM | POA: Diagnosis not present

## 2016-03-20 ENCOUNTER — Ambulatory Visit (INDEPENDENT_AMBULATORY_CARE_PROVIDER_SITE_OTHER): Payer: Medicare HMO

## 2016-03-20 VITALS — BP 124/70 | HR 68 | Ht 64.0 in | Wt 170.2 lb

## 2016-03-20 DIAGNOSIS — Z0000 Encounter for general adult medical examination without abnormal findings: Secondary | ICD-10-CM

## 2016-03-20 NOTE — Patient Instructions (Addendum)
Ms. Stroder , Thank you for taking time to come for your Medicare Wellness Visit. I appreciate your ongoing commitment to your health goals. Please review the following plan we discussed and let me know if I can assist you in the future.   Check with GI regarding your next colonoscopy   Will consider new shingles vaccine; have yet to see the guidelines on those that rec'd prior zostavax   These are the goals we discussed: Goals    . Weight (lb) < 150 lb (68 kg)          Track what you eat for about 3 days Weight 3 days  Check out  online nutrition programs as GumSearch.nl and http://vang.com/; fit32m; Look for foods with "whole" wheat; bran; oatmeal etc Shot at the farmer's markets in season for fresher choices  Watch for "hydrogenated" on the label of oils which are trans-fats.  Watch for "high fructose corn syrup" in snacks, yogurt or ketchup  Meats have less marbling; bright colored fruits and vegetables;  Canned; dump out liquid and wash vegetables. Be mindful of what we are eating  Portion control is essential to a health weight! Sit down; take a break and enjoy your meal; take smaller bites; put the fork down between bites;  It takes 20 minutes to get full; so check in with your fullness cues and stop eating when you start to fill full              This is a list of the screening recommended for you and due dates:  Health Maintenance  Topic Date Due  . Eye exam for diabetics  06/15/2016*  .  Hepatitis C: One time screening is recommended by Center for Disease Control  (CDC) for  adults born from [birth year]through 1967.   08/30/2024*  . Hemoglobin A1C  07/15/2016  . Mammogram  02/16/2017  . Tetanus Vaccine  05/05/2018  . Colon Cancer Screening  07/28/2024  . Flu Shot  Completed  . DEXA scan (bone density measurement)  Addressed  . Pneumonia vaccines  Completed  *Topic was postponed. The date shown is not the original due date.        Fall Prevention in  the Home Falls can cause injuries. They can happen to people of all ages. There are many things you can do to make your home safe and to help prevent falls. What can I do on the outside of my home?  Regularly fix the edges of walkways and driveways and fix any cracks.  Remove anything that might make you trip as you walk through a door, such as a raised step or threshold.  Trim any bushes or trees on the path to your home.  Use bright outdoor lighting.  Clear any walking paths of anything that might make someone trip, such as rocks or tools.  Regularly check to see if handrails are loose or broken. Make sure that both sides of any steps have handrails.  Any raised decks and porches should have guardrails on the edges.  Have any leaves, snow, or ice cleared regularly.  Use sand or salt on walking paths during winter.  Clean up any spills in your garage right away. This includes oil or grease spills. What can I do in the bathroom?  Use night lights.  Install grab bars by the toilet and in the tub and shower. Do not use towel bars as grab bars.  Use non-skid mats or decals in the tub or shower.  If you need to sit down in the shower, use a plastic, non-slip stool.  Keep the floor dry. Clean up any water that spills on the floor as soon as it happens.  Remove soap buildup in the tub or shower regularly.  Attach bath mats securely with double-sided non-slip rug tape.  Do not have throw rugs and other things on the floor that can make you trip. What can I do in the bedroom?  Use night lights.  Make sure that you have a light by your bed that is easy to reach.  Do not use any sheets or blankets that are too big for your bed. They should not hang down onto the floor.  Have a firm chair that has side arms. You can use this for support while you get dressed.  Do not have throw rugs and other things on the floor that can make you trip. What can I do in the kitchen?  Clean up  any spills right away.  Avoid walking on wet floors.  Keep items that you use a lot in easy-to-reach places.  If you need to reach something above you, use a strong step stool that has a grab bar.  Keep electrical cords out of the way.  Do not use floor polish or wax that makes floors slippery. If you must use wax, use non-skid floor wax.  Do not have throw rugs and other things on the floor that can make you trip. What can I do with my stairs?  Do not leave any items on the stairs.  Make sure that there are handrails on both sides of the stairs and use them. Fix handrails that are broken or loose. Make sure that handrails are as long as the stairways.  Check any carpeting to make sure that it is firmly attached to the stairs. Fix any carpet that is loose or worn.  Avoid having throw rugs at the top or bottom of the stairs. If you do have throw rugs, attach them to the floor with carpet tape.  Make sure that you have a light switch at the top of the stairs and the bottom of the stairs. If you do not have them, ask someone to add them for you. What else can I do to help prevent falls?  Wear shoes that:  Do not have high heels.  Have rubber bottoms.  Are comfortable and fit you well.  Are closed at the toe. Do not wear sandals.  If you use a stepladder:  Make sure that it is fully opened. Do not climb a closed stepladder.  Make sure that both sides of the stepladder are locked into place.  Ask someone to hold it for you, if possible.  Clearly mark and make sure that you can see:  Any grab bars or handrails.  First and last steps.  Where the edge of each step is.  Use tools that help you move around (mobility aids) if they are needed. These include:  Canes.  Walkers.  Scooters.  Crutches.  Turn on the lights when you go into a dark area. Replace any light bulbs as soon as they burn out.  Set up your furniture so you have a clear path. Avoid moving your  furniture around.  If any of your floors are uneven, fix them.  If there are any pets around you, be aware of where they are.  Review your medicines with your doctor. Some medicines can make you feel dizzy. This  can increase your chance of falling. Ask your doctor what other things that you can do to help prevent falls. This information is not intended to replace advice given to you by your health care provider. Make sure you discuss any questions you have with your health care provider. Document Released: 10/29/2008 Document Revised: 06/10/2015 Document Reviewed: 02/06/2014 Elsevier Interactive Patient Education  2017 [place] Maintenance, Female Adopting a healthy lifestyle and getting preventive care can go a long way to promote health and wellness. Talk with your health care provider about what schedule of regular examinations is right for you. This is a good chance for you to check in with your provider about disease prevention and staying healthy. In between checkups, there are plenty of things you can do on your own. Experts have done a lot of research about which lifestyle changes and preventive measures are most likely to keep you healthy. Ask your health care provider for more information. Weight and diet Eat a healthy diet  Be sure to include plenty of vegetables, fruits, low-fat dairy products, and lean protein.  Do not eat a lot of foods high in solid fats, added sugars, or salt.  Get regular exercise. This is one of the most important things you can do for your health.  Most adults should exercise for at least 150 minutes each week. The exercise should increase your heart rate and make you sweat (moderate-intensity exercise).  Most adults should also do strengthening exercises at least twice a week. This is in addition to the moderate-intensity exercise. Maintain a healthy weight  Body mass index (BMI) is a measurement that can be used to identify possible  weight problems. It estimates body fat based on height and weight. Your health care provider can help determine your BMI and help you achieve or maintain a healthy weight.  For females 31 years of age and older:  A BMI below 18.5 is considered underweight.  A BMI of 18.5 to 24.9 is normal.  A BMI of 25 to 29.9 is considered overweight.  A BMI of 30 and above is considered obese. Watch levels of cholesterol and blood lipids  You should start having your blood tested for lipids and cholesterol at 73 years of age, then have this test every 5 years.  You may need to have your cholesterol levels checked more often if:  Your lipid or cholesterol levels are high.  You are older than 73 years of age.  You are at high risk for heart disease. Cancer screening Lung Cancer  Lung cancer screening is recommended for adults 56-27 years old who are at high risk for lung cancer because of a history of smoking.  A yearly low-dose CT scan of the lungs is recommended for people who:  Currently smoke.  Have quit within the past 15 years.  Have at least a 30-pack-year history of smoking. A pack year is smoking an average of one pack of cigarettes a day for 1 year.  Yearly screening should continue until it has been 15 years since you quit.  Yearly screening should stop if you develop a health problem that would prevent you from having lung cancer treatment. Breast Cancer  Practice breast self-awareness. This means understanding how your breasts normally appear and feel.  It also means doing regular breast self-exams. Let your health care provider know about any changes, no matter how small.  If you are in your 20s or 30s, you should have a clinical breast exam (  CBE) by a health care provider every 1-3 years as part of a regular health exam.  If you are 86 or older, have a CBE every year. Also consider having a breast X-ray (mammogram) every year.  If you have a family history of breast  cancer, talk to your health care provider about genetic screening.  If you are at high risk for breast cancer, talk to your health care provider about having an MRI and a mammogram every year.  Breast cancer gene (BRCA) assessment is recommended for women who have family members with BRCA-related cancers. BRCA-related cancers include:  Breast.  Ovarian.  Tubal.  Peritoneal cancers.  Results of the assessment will determine the need for genetic counseling and BRCA1 and BRCA2 testing. Cervical Cancer  Your health care provider may recommend that you be screened regularly for cancer of the pelvic organs (ovaries, uterus, and vagina). This screening involves a pelvic examination, including checking for microscopic changes to the surface of your cervix (Pap test). You may be encouraged to have this screening done every 3 years, beginning at age 86.  For women ages 12-65, health care providers may recommend pelvic exams and Pap testing every 3 years, or they may recommend the Pap and pelvic exam, combined with testing for human papilloma virus (HPV), every 5 years. Some types of HPV increase your risk of cervical cancer. Testing for HPV may also be done on women of any age with unclear Pap test results.  Other health care providers may not recommend any screening for nonpregnant women who are considered low risk for pelvic cancer and who do not have symptoms. Ask your health care provider if a screening pelvic exam is right for you.  If you have had past treatment for cervical cancer or a condition that could lead to cancer, you need Pap tests and screening for cancer for at least 20 years after your treatment. If Pap tests have been discontinued, your risk factors (such as having a new sexual partner) need to be reassessed to determine if screening should resume. Some women have medical problems that increase the chance of getting cervical cancer. In these cases, your health care provider may  recommend more frequent screening and Pap tests. Colorectal Cancer  This type of cancer can be detected and often prevented.  Routine colorectal cancer screening usually begins at 73 years of age and continues through 73 years of age.  Your health care provider may recommend screening at an earlier age if you have risk factors for colon cancer.  Your health care provider may also recommend using home test kits to check for hidden blood in the stool.  A small camera at the end of a tube can be used to examine your colon directly (sigmoidoscopy or colonoscopy). This is done to check for the earliest forms of colorectal cancer.  Routine screening usually begins at age 78.  Direct examination of the colon should be repeated every 5-10 years through 73 years of age. However, you may need to be screened more often if early forms of precancerous polyps or small growths are found. Skin Cancer  Check your skin from head to toe regularly.  Tell your health care provider about any new moles or changes in moles, especially if there is a change in a mole's shape or color.  Also tell your health care provider if you have a mole that is larger than the size of a pencil eraser.  Always use sunscreen. Apply sunscreen liberally and repeatedly  throughout the day.  Protect yourself by wearing long sleeves, pants, a wide-brimmed hat, and sunglasses whenever you are outside. Heart disease, diabetes, and high blood pressure  High blood pressure causes heart disease and increases the risk of stroke. High blood pressure is more likely to develop in:  People who have blood pressure in the high end of the normal range (130-139/85-89 mm Hg).  People who are overweight or obese.  People who are African American.  If you are 44-59 years of age, have your blood pressure checked every 3-5 years. If you are 69 years of age or older, have your blood pressure checked every year. You should have your blood pressure  measured twice-once when you are at a hospital or clinic, and once when you are not at a hospital or clinic. Record the average of the two measurements. To check your blood pressure when you are not at a hospital or clinic, you can use:  An automated blood pressure machine at a pharmacy.  A home blood pressure monitor.  If you are between 13 years and 34 years old, ask your health care provider if you should take aspirin to prevent strokes.  Have regular diabetes screenings. This involves taking a blood sample to check your fasting blood sugar level.  If you are at a normal weight and have a low risk for diabetes, have this test once every three years after 73 years of age.  If you are overweight and have a high risk for diabetes, consider being tested at a younger age or more often. Preventing infection Hepatitis B  If you have a higher risk for hepatitis B, you should be screened for this virus. You are considered at high risk for hepatitis B if:  You were born in a country where hepatitis B is common. Ask your health care provider which countries are considered high risk.  Your parents were born in a high-risk country, and you have not been immunized against hepatitis B (hepatitis B vaccine).  You have HIV or AIDS.  You use needles to inject street drugs.  You live with someone who has hepatitis B.  You have had sex with someone who has hepatitis B.  You get hemodialysis treatment.  You take certain medicines for conditions, including cancer, organ transplantation, and autoimmune conditions. Hepatitis C  Blood testing is recommended for:  Everyone born from [birth year] through 1967.  Anyone with known risk factors for hepatitis C. Sexually transmitted infections (STIs)  You should be screened for sexually transmitted infections (STIs) including gonorrhea and chlamydia if:  You are sexually active and are younger than 73 years of age.  You are older than 73 years of age and  your health care provider tells you that you are at risk for this type of infection.  Your sexual activity has changed since you were last screened and you are at an increased risk for chlamydia or gonorrhea. Ask your health care provider if you are at risk.  If you do not have HIV, but are at risk, it may be recommended that you take a prescription medicine daily to prevent HIV infection. This is called pre-exposure prophylaxis (PrEP). You are considered at risk if:  You are sexually active and do not regularly use condoms or know the HIV status of your partner(s).  You take drugs by injection.  You are sexually active with a partner who has HIV. Talk with your health care provider about whether you are at high risk of  being infected with HIV. If you choose to begin PrEP, you should first be tested for HIV. You should then be tested every 3 months for as long as you are taking PrEP. Pregnancy  If you are premenopausal and you may become pregnant, ask your health care provider about preconception counseling.  If you may become pregnant, take 400 to 800 micrograms (mcg) of folic acid every day.  If you want to prevent pregnancy, talk to your health care provider about birth control (contraception). Osteoporosis and menopause  Osteoporosis is a disease in which the bones lose minerals and strength with aging. This can result in serious bone fractures. Your risk for osteoporosis can be identified using a bone density scan.  If you are 67 years of age or older, or if you are at risk for osteoporosis and fractures, ask your health care provider if you should be screened.  Ask your health care provider whether you should take a calcium or vitamin D supplement to lower your risk for osteoporosis.  Menopause may have certain physical symptoms and risks.  Hormone replacement therapy may reduce some of these symptoms and risks. Talk to your health care provider about whether hormone replacement  therapy is right for you. Follow these instructions at home:  Schedule regular health, dental, and eye exams.  Stay current with your immunizations.  Do not use any tobacco products including cigarettes, chewing tobacco, or electronic cigarettes.  If you are pregnant, do not drink alcohol.  If you are breastfeeding, limit how much and how often you drink alcohol.  Limit alcohol intake to no more than 1 drink per day for nonpregnant women. One drink equals 12 ounces of beer, 5 ounces of wine, or 1 ounces of hard liquor.  Do not use street drugs.  Do not share needles.  Ask your health care provider for help if you need support or information about quitting drugs.  Tell your health care provider if you often feel depressed.  Tell your health care provider if you have ever been abused or do not feel safe at home. This information is not intended to replace advice given to you by your health care provider. Make sure you discuss any questions you have with your health care provider. Document Released: 07/18/2010 Document Revised: 06/10/2015 Document Reviewed: 10/06/2014 Elsevier Interactive Patient Education  2017 [employer].

## 2016-03-20 NOTE — Progress Notes (Signed)
Subjective:   Cassandra James is a 71 y.o. female who presents for Medicare Annual (Subsequent) preventive examination.  HRA assessment completed during this visit with Ms Mothershead The Patient was informed that the wellness visit is to identify future health risk and educate and initiate measures that can reduce risk for increased disease through the lifespan.    NO ROS; Medicare Wellness Visit Describes health as good, fair or great? Fair to good  Knee issues  But just tries to be very careful  Risk Associated with PMH  Disease processes recently acquired pneumonia and following with pulmonary; then spouse became ill; later had C-diff  In spouse. Family still recovering  Mobility difficulty no Hearing loss; wears hearing aids  LABS 12/2015 - pre diabetic   Psychosocial  2 boys and one girl Grands 3 boys and 2 girls  Support does not see them often One is autisitic; young grandchildren are in [place]    Primary Prevention LDCT; apt with Dr. Melvyn Novas in pulmonology  Dr. Maudie Mercury requested PFT;  Defer the LDCT to Pulmonology and she will discuss hx and possible LDCT smokless tobacco  no  ETOH - approx 3 times a week   Diet pre diabetic  States she does not eat very much sugar   Eats healthy  Vegetables are fresh; may use frozen periodically Eats out 2 times a week Eat 3 meals a day  Lunch 1/2 sandwich; Breakfast toast  Supper average meal; vegetables    Exercise hdl 49  Last year had a torn meniscus and spouse had disc surgery and still has discomfort with hip Knee is ok; but is very careful Do plan on getting back to the gym  Likes the Bicycle or treadmill  Pool; does like the water   Dental; regular dental checks  One level home; will probably be moving to 5 to 10 years  Move to the [facility] area   Safety Fall hx; no   Hearing Screening Comments: Wears hearing aids; has x 25 years  Vision Screening Comments: Last eye exam; one year ago Next one in two weeks Dr.  Camillo Flaming   Given education on "Fall Prevention in the Home" for more safety tips the patient can apply as appropriate.   Given information on Community safety; driving safety, sun protection, firearm safety, smoke detectors as well as the "yellow dot" program for residents in [facility].   Screenings for secondary risk [facility]; Dr. Matthew Saras follows Dexa 02/2014 - good he checks q 3 years; will repeat 02/2017 Deferred to dr. Matthew Saras who follows this as well  Colonoscopy 07/2014 - sister was just dx with colon cancer / added to hx; - may repeat sooner; recommended she alert her GI MD to see if this would change her guidelines   Vaccination update: up to date Educated on the new shingles vaccine released in 2018   Medications; no verbalized issues      Depression; anxiety or mood issues assessed  Do you have little interest or pleasure in doing things? no Have you been feeling down, depressed, hopeless? no PHQ9 waived or completed    Cognitive screen completed; MMSE documented or assessed for failures or issues with the AD8 screen below:   Ad8 score reviewed for issues;  Issues making decisions; no  Less interest in hobbies / activities" no  Repeats questions, stories; family complaining: NO  Trouble using ordinary gadgets; microwave; computer: no  Forgets the month or year:  no  Mismanaging finances: no  Missing apt: no but does write them down  Daily problems with thinking of memory NO Ad8 score is 0  MMSE not appropriate unless AD8 score is > 2   Advanced Directive has been completed    Established and updated Risk reviewed and appropriate referral made or health recommendations as appropriate based on individual needs and choices;   Patient Care Team: Lucretia Kern, DO as PCP - General (Family Medicine) Dr. Matthew Saras GYN Dr. Melvyn Novas in pulmonology           Objective:     Vitals: BP 124/70   Pulse 68    Ht 5\' 4"  (1.626 m)   Wt 170 lb 3 oz (77.2 kg)   SpO2 93%   BMI 29.21 kg/m   Body mass index is 29.21 kg/m.   Tobacco History  Smoking Status  . Former Smoker  . Quit date: 01/17/2000  Smokeless Tobacco  . Never Used    Comment: smoking hx unknown at this time; started in college      Counseling given: Yes   Past Medical History:  Diagnosis Date  . Cataract   . Colon polyps    history of  . COLONIC POLYPS, HX OF 08/09/2006   Qualifier: Diagnosis of  By: Scherrie Gerlach    . DECREASED HEARING 11/25/2008   Qualifier: Diagnosis of  By: Scherrie Gerlach    . Elevated LFTs    obstructive disease  . HIATAL HERNIA 02/05/2009   Qualifier: Diagnosis of  By: Harriet Pho    . Hyperglycemia 11/10/2013  . Hyperlipidemia   . Hypertension   . Osteoporosis    was osteopenia-last bone scan all clear   . Pernicious anemia   . Sleep apnea 01/14/2016   Past Surgical History:  Procedure Laterality Date  . [place] RELEASE Left 2016  . cataract surgery  2011   Bilateral  . COLONOSCOPY    . HAND TENDON SURGERY  01/16/2014   Dr Burney Gauze  . OVARIAN CYST SURGERY  1968  . RETINAL DETACHMENT SURGERY  2011   left  . TOENAIL EXCISION Right    Dr Patrick Jupiter great toenail excision  . TRAPEZIUM RESECTION Left 2016   left wrist   . TUBAL LIGATION  1979   Family History  Problem Relation Age of Onset  . Pernicious anemia Mother   . Cancer Father     lung/ bone  . Osteoporosis Father   . Hypothyroidism Father   . Colon cancer Sister   . Colon polyps Neg Hx   . Rectal cancer Neg Hx   . Stomach cancer Neg Hx    History  Sexual Activity  . Sexual activity: Not on file    Outpatient Encounter Prescriptions as of 03/20/2016  Medication Sig  . aspirin 81 MG chewable tablet Chew 81 mg by mouth every other day.  . benzonatate (TESSALON PERLES) 100 MG capsule Take 1 capsule (100 mg total) by mouth 3 (three) times daily as needed for cough.  .  cholecalciferol (VITAMIN D) 1000 units tablet Take 1,000 Units by mouth daily.  . cyanocobalamin (,VITAMIN B-12,) 1000 MCG/ML injection Inject 1 mL (1,000 mcg total) into the muscle every 30 (thirty) days.  . hyoscyamine (LEVSIN SL) 0.125 MG SL tablet Place 0.25 mg under the tongue every 4 (four) hours as needed for cramping.  . Ipratropium-Albuterol (COMBIVENT RESPIMAT) 20-100 MCG/ACT AERS respimat Inhale 1 puff  into the lungs every 6 (six) hours as needed for wheezing or shortness of breath.  . potassium chloride SA (K-DUR,KLOR-CON) 20 MEQ tablet Take 20 mEq by mouth daily.  . rosuvastatin (CRESTOR) 20 MG tablet Take 10 mg by mouth daily.   . valsartan-hydrochlorothiazide (DIOVAN-HCT) 320-25 MG tablet Take 1 tablet by mouth daily.   No facility-administered encounter medications on file as of 03/20/2016.     Activities of Daily Living In your present state of health, do you have any difficulty performing the following activities: 03/20/2016 01/14/2016  Hearing? (No Data) Y  Vision? N N  Difficulty concentrating or making decisions? N N  Walking or climbing stairs? Y Y  Dressing or bathing? N N  Doing errands, shopping? N Y  Conservation officer, nature and eating ? N -  Using the Toilet? N -  In the past six months, have you accidently leaked urine? N -  Do you have problems with loss of bowel control? N -  Managing your Medications? N -  Managing your Finances? N -  Housekeeping or managing your Housekeeping? N -  Some recent data might be hidden    Patient Care Team: Lucretia Kern, DO as PCP - General (Family Medicine)    Assessment:   Exercise Activities and Dietary recommendations Current Exercise Habits: Home exercise routine (wants to get back to exercise )  Goals    . Weight (lb) < 150 lb (68 kg)          Track what you eat for about 3 days Weight 3 days  Check out  online nutrition programs as GumSearch.nl and http://vang.com/; fit71me; Look for foods with "whole" wheat; bran;  oatmeal etc Shot at the farmer's markets in season for fresher choices  Watch for "hydrogenated" on the label of oils which are trans-fats.  Watch for "high fructose corn syrup" in snacks, yogurt or ketchup  Meats have less marbling; bright colored fruits and vegetables;  Canned; dump out liquid and wash vegetables. Be mindful of what we are eating  Portion control is essential to a health weight! Sit down; take a break and enjoy your meal; take smaller bites; put the fork down between bites;  It takes 20 minutes to get full; so check in with your fullness cues and stop eating when you start to fill full             Fall Risk Fall Risk  03/20/2016 03/03/2015 03/13/2014 03/13/2014 06/30/2013  Falls in the past year? No No No No No   Depression Screen PHQ 2/9 Scores 03/20/2016 03/20/2016 03/03/2015 03/13/2014  PHQ - 2 Score 0 0 0 0     Cognitive Function        Immunization History  Administered Date(s) Administered  . Influenza Whole 10/26/2006, 10/30/2007, 10/17/2011  . Influenza, High Dose Seasonal PF 09/23/2014, 09/14/2015  . Influenza,inj,Quad PF,36+ Mos 11/10/2013  . Influenza-Unspecified 10/16/2012  . Pneumococcal Conjugate-13 11/10/2013  . Pneumococcal Polysaccharide-23 01/28/2009  . Td 05/04/2008  . Zoster 05/24/2007   Screening Tests Health Maintenance  Topic Date Due  . OPHTHALMOLOGY EXAM  06/15/2016 (Originally 03/17/2015)  . Hepatitis C Screening  08/30/2024 (Originally 1945-04-04)  . HEMOGLOBIN A1C  07/15/2016  . MAMMOGRAM  02/16/2017  . TETANUS/TDAP  05/05/2018  . COLONOSCOPY  07/28/2024  . INFLUENZA VACCINE  Completed  . DEXA SCAN  Addressed  . PNA vac Low Risk Adult  Completed      Plan:      PCP Notes  Health  Maintenance To fup with Dr. Melvyn Novas regarding possible LDCT for smoking hx unspecified but most of her life since college  Eye exam in 2 weeks   Mammograms are up to date and followed by Dr. Matthew Saras  Dexa scan up to date and followed by Dr.  Ignacia Felling had recent dx of colon cancer; added to history and asked to check with GI in that her guidelines for repeat may change   Abnormal Screens none noted  Referrals none  Patient concerns; felt she may fup with LDCT   Nurse Concerns; none   Next PCP apt 06/04   During the course of the visit the patient was educated and counseled about the following appropriate screening and preventive services:   Vaccines to include Pneumoccal, Influenza, Hepatitis B, Td, Zostavax, HCV  Electrocardiogram  Cardiovascular Disease  Colorectal cancer screening  Bone density screening  Diabetes screening  Glaucoma screening  Mammography/PAP  Nutrition counseling   Patient Instructions (the written plan) was given to the patient.   Wynetta Fines, RN  03/20/2016

## 2016-03-20 NOTE — Progress Notes (Signed)
Cassandra Collister R., DO  

## 2016-03-30 DIAGNOSIS — H26492 Other secondary cataract, left eye: Secondary | ICD-10-CM | POA: Diagnosis not present

## 2016-03-30 DIAGNOSIS — H43813 Vitreous degeneration, bilateral: Secondary | ICD-10-CM | POA: Diagnosis not present

## 2016-03-30 DIAGNOSIS — H40012 Open angle with borderline findings, low risk, left eye: Secondary | ICD-10-CM | POA: Diagnosis not present

## 2016-03-30 DIAGNOSIS — H04123 Dry eye syndrome of bilateral lacrimal glands: Secondary | ICD-10-CM | POA: Diagnosis not present

## 2016-03-30 LAB — HM DIABETES EYE EXAM

## 2016-04-04 ENCOUNTER — Encounter: Payer: Self-pay | Admitting: Family Medicine

## 2016-04-11 ENCOUNTER — Telehealth: Payer: Self-pay | Admitting: Internal Medicine

## 2016-04-11 NOTE — Telephone Encounter (Signed)
Yes need to put this off for another 2 weeks p laser surgery

## 2016-04-11 NOTE — Telephone Encounter (Signed)
PFT and ROV has been rescheduled to 06/21/16, as this was first available for same day. I offered to schedule PFT at [facility], pt preferred to have test here.  MW please advise if apt is okay. Thanks.

## 2016-04-11 NOTE — Telephone Encounter (Signed)
Dr. Melvyn Novas  Please Advise-  Pt has a PFT scheduled on 05/04/16 but she is suppose to have laser eye surgery on 05/02/16. She wanted to know do you think she should hold off on the pft.

## 2016-04-11 NOTE — Telephone Encounter (Signed)
Fine with me as long as she's doing ok, otherwise would move up and do at [facility]

## 2016-04-12 NOTE — Telephone Encounter (Signed)
Noted by triage, thank you. Will sign off. 

## 2016-04-12 NOTE — Telephone Encounter (Signed)
Patient called back - states that she is doing fine and that she will call back if she feels like she needs to -Patient needs nothing further -pr

## 2016-04-12 NOTE — Telephone Encounter (Signed)
lmtcb for pt.  

## 2016-05-02 DIAGNOSIS — H02839 Dermatochalasis of unspecified eye, unspecified eyelid: Secondary | ICD-10-CM | POA: Diagnosis not present

## 2016-05-02 DIAGNOSIS — H26493 Other secondary cataract, bilateral: Secondary | ICD-10-CM | POA: Diagnosis not present

## 2016-05-02 DIAGNOSIS — L570 Actinic keratosis: Secondary | ICD-10-CM | POA: Diagnosis not present

## 2016-05-02 DIAGNOSIS — H40003 Preglaucoma, unspecified, bilateral: Secondary | ICD-10-CM | POA: Diagnosis not present

## 2016-05-02 DIAGNOSIS — H18413 Arcus senilis, bilateral: Secondary | ICD-10-CM | POA: Diagnosis not present

## 2016-05-02 DIAGNOSIS — D2272 Melanocytic nevi of left lower limb, including hip: Secondary | ICD-10-CM | POA: Diagnosis not present

## 2016-05-02 DIAGNOSIS — L821 Other seborrheic keratosis: Secondary | ICD-10-CM | POA: Diagnosis not present

## 2016-05-02 DIAGNOSIS — H26492 Other secondary cataract, left eye: Secondary | ICD-10-CM | POA: Diagnosis not present

## 2016-05-04 ENCOUNTER — Ambulatory Visit: Payer: Medicare HMO | Admitting: Internal Medicine

## 2016-05-23 DIAGNOSIS — H26491 Other secondary cataract, right eye: Secondary | ICD-10-CM | POA: Diagnosis not present

## 2016-06-13 ENCOUNTER — Encounter: Payer: Self-pay | Admitting: Family Medicine

## 2016-06-16 NOTE — Progress Notes (Signed)
HPI:  Cassandra James is a pleasant 71 y.o. here for follow up. Chronic medical problems summarized below were reviewed for changes and stability and were updated as needed below. These issues and their treatment remain stable for the most part. Doing well. No complaints today. No regular exercise. Reports diet is healthy. Wants to check thyroid for hair loss - will be seeing Dr[facility] Kemper Durie at [facility] for this. Denies CP, SOB, DOE, treatment intolerance or new symptoms.  Due for labs: cbc, bmp, lipids, hgba1c  COPD: -seeing Dr. Melvyn Novas, has follow up in a few days and plan to have full pfts per notes -sleep apnea on list - she denies any hx of this and denies ever testing, denies snoring, daytime somnolence or unrestful sleep  IBS: -used to see Dr. Deatra Ina - but he has retired -reports uses levsin rarely and requests refill -stable  HTN: -chronic -meds: asa, valsartan-hctz 320-25 -denies: Chest pain, swelling, headache, palpitations  Hyperglycemia: -in prediabetes range -treated with diet and exercise -denies: Polyuria, polydipsia, worsening vision  HLD/Obesity: -meds: crestor 20mg , only taking 10 mg daily -diet and exercise: No regular exercise, diet is so-so -denies: leg cramps  B12 deficiency: -Uses B12 injections, request refills -reported hx pernicious anemia - all cbcs in chart back to 6 years ago nromal  Right knee pain: -Seeing [facility]  Osteoporosis: -monitored by her gynecologist, Dr. Matthew Saras  ROS: See pertinent positives and negatives per HPI.  Past Medical History:  Diagnosis Date  . Cataract   . Colon polyps    history of  . COLONIC POLYPS, HX OF 08/09/2006   Qualifier: Diagnosis of  By: Scherrie Gerlach    . DECREASED HEARING 11/25/2008   Qualifier: Diagnosis of  By: Scherrie Gerlach    . Elevated LFTs    obstructive disease  . HIATAL HERNIA 02/05/2009   Qualifier: Diagnosis of  By: Harriet Pho    . Hyperglycemia 11/10/2013  .  Hyperlipidemia   . Hypertension   . Osteoporosis    was osteopenia-last bone scan all clear   . Pernicious anemia   . Sleep apnea 01/14/2016    Past Surgical History:  Procedure Laterality Date  . [place] RELEASE Left 2016  . cataract surgery  2011   Bilateral  . COLONOSCOPY    . HAND TENDON SURGERY  01/16/2014   Dr Burney Gauze  . OVARIAN CYST SURGERY  1968  . RETINAL DETACHMENT SURGERY  2011   left  . TOENAIL EXCISION Right    Dr Patrick Jupiter great toenail excision  . TRAPEZIUM RESECTION Left 2016   left wrist   . TUBAL LIGATION  1979    Family History  Problem Relation Age of Onset  . Pernicious anemia Mother   . Cancer Father        lung/ bone  . Osteoporosis Father   . Hypothyroidism Father   . Colon cancer Sister   . Cancer - Colon Sister   . Colon polyps Neg Hx   . Rectal cancer Neg Hx   . Stomach cancer Neg Hx     Social History   Social History  . Marital status: Married    Spouse name: N/A  . Number of children: N/A  . Years of education: N/A   Social History Main Topics  . Smoking status: Former Smoker    Quit date: 01/17/2000  . Smokeless tobacco: Never Used     Comment: smoking hx unknown at  this time; started in college   . Alcohol use 1.2 - 1.8 oz/week    2 - 3 Standard drinks or equivalent per week     Comment: 3 times a week   . Drug use: No  . Sexual activity: Not Asked   Other Topics Concern  . 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  aspirin 81 MG chewable tablet, Chew 81 mg by mouth every other day., Disp: , Rfl:  .  benzonatate (TESSALON PERLES) 100 MG capsule, Take 1 capsule (100 mg total) by mouth 3 (three) times  daily as needed for cough., Disp: 20 capsule, Rfl: 0 .  cholecalciferol (VITAMIN D) 1000 units tablet, Take 1,000 Units by mouth daily., Disp: , Rfl:  .  cyanocobalamin (,VITAMIN B-12,) 1000 MCG/ML injection, Inject 1 mL (1,000 mcg total) into the muscle every 30 (thirty) days., Disp: 1 mL, Rfl: 12 .  hyoscyamine (LEVSIN SL) 0.125 MG SL tablet, Place 0.25 mg under the tongue every 4 (four) hours as needed for cramping., Disp: , Rfl:  .  Ipratropium-Albuterol (COMBIVENT RESPIMAT) 20-100 MCG/ACT AERS respimat, Inhale 1 puff into the lungs every 6 (six) hours as needed for wheezing or shortness of breath., Disp: 1 Inhaler, Rfl: 2 .  potassium chloride SA (K-DUR,KLOR-CON) 20 MEQ tablet, Take 20 mEq by mouth daily., Disp: , Rfl:  .  rosuvastatin (CRESTOR) 20 MG tablet, Take 10 mg by mouth daily. , Disp: , Rfl:  .  valsartan-hydrochlorothiazide (DIOVAN-HCT) 320-25 MG tablet, Take 1 tablet by mouth daily., Disp: 90 tablet, Rfl: 1  EXAM:  Vitals:   06/19/16 0851  BP: 116/70  Pulse: 66  Temp: 98 F (36.7 C)    Body mass index is 28.34 kg/m.  GENERAL: vitals reviewed and listed above, alert, oriented, appears well hydrated and in no acute distress  HEENT: atraumatic, conjunttiva clear, no obvious abnormalities on inspection of external nose and ears  NECK: no obvious masses on inspection  LUNGS: clear to auscultation bilaterally, no wheezes, rales or rhonchi, good air movement  CV: HRRR, no peripheral edema  MS: moves all extremities without noticeable abnormality  PSYCH: pleasant and cooperative, no obvious depression or anxiety  ASSESSMENT AND PLAN:  Discussed the following assessment and plan:  Essential hypertension - Plan: CBC, Basic metabolic panel  Hyperlipidemia, unspecified hyperlipidemia type - Plan: Lipid panel  Hyperglycemia - Plan: Hemoglobin A1c  Osteoporosis, unspecified osteoporosis type, unspecified pathological fracture presence  B12 deficiency  BMI  28.0-28.9,adult  COPD GOLD III  Hair loss - Plan: TSH  -lifestyle recs -labs -TSH for hair loss - she will be seeing derm at [facility] for this -Patient advised to return or notify a doctor immediately if symptoms worsen or persist or new concerns arise.  Patient Instructions  BEFORE YOU LEAVE: -follow up: 3-4 months -labs  We have ordered labs or studies at this visit. It can take up to 1-2 weeks for results and processing. IF results require follow up or explanation, we will call you with instructions. Clinically stable results will be released to your [facility]. If you have not heard  from [place] or cannot find your results in [facility] in 2 weeks please contact our office at [phone].  If you are not yet signed up for [facility], please consider signing up.  Advise regular aerobic exercise (at least 150 minutes per week of sweaty exercise) and a healthy diet. Try to eat at least 5-9 servings of vegetables and fruits per day (not corn, potatoes or bananas.) Avoid sweets, red meat, pork, butter, fried foods, fast food, processed food, excessive dairy, eggs and coconut. Replace bad fats with good fats - fish, nuts and seeds, canola oil, olive oil.   WE NOW OFFER   [facility] FAST TRACK!!!  SAME DAY Appointments for ACUTE CARE  Such as: Sprains, Injuries, cuts, abrasions, rashes, muscle pain, joint pain, back pain Colds, flu, sore throats, headache, allergies, cough, fever  Ear pain, sinus and eye infections Abdominal pain, nausea, vomiting, diarrhea, upset stomach Animal/insect bites  3 Easy Ways to Schedule: Walk-In Scheduling Call in scheduling Mychart Sign-up: [email or web address]                Colin Benton R., DO

## 2016-06-19 ENCOUNTER — Encounter: Payer: Self-pay | Admitting: Family Medicine

## 2016-06-19 ENCOUNTER — Ambulatory Visit (INDEPENDENT_AMBULATORY_CARE_PROVIDER_SITE_OTHER): Payer: Medicare HMO | Admitting: Family Medicine

## 2016-06-19 VITALS — BP 116/70 | HR 66 | Temp 98.0°F | Ht 64.0 in | Wt 165.1 lb

## 2016-06-19 DIAGNOSIS — M810 Age-related osteoporosis without current pathological fracture: Secondary | ICD-10-CM

## 2016-06-19 DIAGNOSIS — E785 Hyperlipidemia, unspecified: Secondary | ICD-10-CM | POA: Diagnosis not present

## 2016-06-19 DIAGNOSIS — E538 Deficiency of other specified B group vitamins: Secondary | ICD-10-CM

## 2016-06-19 DIAGNOSIS — J449 Chronic obstructive pulmonary disease, unspecified: Secondary | ICD-10-CM | POA: Diagnosis not present

## 2016-06-19 DIAGNOSIS — I10 Essential (primary) hypertension: Secondary | ICD-10-CM | POA: Diagnosis not present

## 2016-06-19 DIAGNOSIS — L659 Nonscarring hair loss, unspecified: Secondary | ICD-10-CM

## 2016-06-19 DIAGNOSIS — R739 Hyperglycemia, unspecified: Secondary | ICD-10-CM

## 2016-06-19 DIAGNOSIS — Z6828 Body mass index (BMI) 28.0-28.9, adult: Secondary | ICD-10-CM

## 2016-06-19 LAB — BASIC METABOLIC PANEL
BUN: 21 mg/dL (ref 6–23)
CALCIUM: 9.5 mg/dL (ref 8.4–10.5)
CO2: 30 mEq/L (ref 19–32)
CREATININE: 0.77 mg/dL (ref 0.40–1.20)
Chloride: 101 mEq/L (ref 96–112)
GFR: 78.13 mL/min (ref >60.00)
Glucose, Bld: 116 mg/dL — ABNORMAL HIGH (ref 70–99)
POTASSIUM: 4.1 meq/L (ref 3.5–5.1)
Sodium: 139 mEq/L (ref 135–145)

## 2016-06-19 LAB — CBC
HEMATOCRIT: 44.2 % (ref 36.0–46.0)
Hemoglobin: 14.9 g/dL (ref 12.0–15.0)
MCHC: 33.6 g/dL (ref 30.0–36.0)
MCV: 82.5 fl (ref 78.0–100.0)
PLATELETS: 255 10*3/uL (ref 150.0–400.0)
RBC: 5.35 Mil/uL — ABNORMAL HIGH (ref 3.87–5.11)
RDW: 13.1 % (ref 11.5–15.5)
WBC: 6.8 10*3/uL (ref 4.0–10.5)

## 2016-06-19 LAB — LIPID PANEL
CHOLESTEROL: 152 mg/dL (ref 0–200)
HDL: 48.9 mg/dL (ref >39.00)
LDL Cholesterol: 68 mg/dL (ref 0–99)
NonHDL: 103.52
Total CHOL/HDL Ratio: 3
Triglycerides: 177 mg/dL — ABNORMAL HIGH (ref 0.0–149.0)
VLDL: 35.4 mg/dL (ref 0.0–40.0)

## 2016-06-19 LAB — TSH: TSH: 1.74 u[IU]/mL (ref 0.35–4.50)

## 2016-06-19 LAB — HEMOGLOBIN A1C: HEMOGLOBIN A1C: 6.5 % (ref 4.6–6.5)

## 2016-06-19 NOTE — Patient Instructions (Signed)
BEFORE YOU LEAVE: -follow up: 3-4 months -labs  We have ordered labs or studies at this visit. It can take up to 1-2 weeks for results and processing. IF results require follow up or explanation, we will call you with instructions. Clinically stable results will be released to your MYCHART. If you have not heard from us or cannot find your results in MYCHART in 2 weeks please contact our office at [phone].  If you are not yet signed up for MYCHART, please consider signing up.  Advise regular aerobic exercise (at least 150 minutes per week of sweaty exercise) and a healthy diet. Try to eat at least 5-9 servings of vegetables and fruits per day (not corn, potatoes or bananas.) Avoid sweets, red meat, pork, butter, fried foods, fast food, processed food, excessive dairy, eggs and coconut. Replace bad fats with good fats - fish, nuts and seeds, canola oil, olive oil.   WE NOW OFFER   [facility] FAST TRACK!!!  SAME DAY Appointments for ACUTE CARE  Such as: Sprains, Injuries, cuts, abrasions, rashes, muscle pain, joint pain, back pain Colds, flu, sore throats, headache, allergies, cough, fever  Ear pain, sinus and eye infections Abdominal pain, nausea, vomiting, diarrhea, upset stomach Animal/insect bites  3 Easy Ways to Schedule: Walk-In Scheduling Call in scheduling Mychart Sign-up: [email or web address]                

## 2016-06-20 ENCOUNTER — Ambulatory Visit: Payer: Medicare HMO | Admitting: Family Medicine

## 2016-06-21 ENCOUNTER — Encounter: Payer: Self-pay | Admitting: Internal Medicine

## 2016-06-21 ENCOUNTER — Ambulatory Visit (INDEPENDENT_AMBULATORY_CARE_PROVIDER_SITE_OTHER): Payer: Medicare HMO | Admitting: Internal Medicine

## 2016-06-21 VITALS — BP 118/64 | HR 67 | Ht 63.0 in | Wt 174.0 lb

## 2016-06-21 DIAGNOSIS — J449 Chronic obstructive pulmonary disease, unspecified: Secondary | ICD-10-CM

## 2016-06-21 LAB — PULMONARY FUNCTION TEST
DL/VA % pred: 97 %
DL/VA: 4.59 ml/min/mmHg/L
DLCO COR % PRED: 83 %
DLCO COR: 19.47 ml/min/mmHg
DLCO UNC % PRED: 86 %
DLCO UNC: 20.04 ml/min/mmHg
FEF 25-75 Post: 0.68 L/sec
FEF 25-75 Pre: 0.61 L/sec
FEF2575-%Change-Post: 11 %
FEF2575-%Pred-Post: 39 %
FEF2575-%Pred-Pre: 35 %
FEV1-%Change-Post: 6 %
FEV1-%PRED-POST: 61 %
FEV1-%Pred-Pre: 57 %
FEV1-POST: 1.3 L
FEV1-Pre: 1.22 L
FEV1FVC-%CHANGE-POST: 2 %
FEV1FVC-%Pred-Pre: 76 %
FEV6-%CHANGE-POST: 4 %
FEV6-%PRED-POST: 80 %
FEV6-%PRED-PRE: 76 %
FEV6-PRE: 2.05 L
FEV6-Post: 2.14 L
FEV6FVC-%CHANGE-POST: 0 %
FEV6FVC-%PRED-PRE: 103 %
FEV6FVC-%Pred-Post: 102 %
FVC-%Change-Post: 3 %
FVC-%Pred-Post: 78 %
FVC-%Pred-Pre: 75 %
FVC-POST: 2.2 L
FVC-Pre: 2.12 L
POST FEV1/FVC RATIO: 59 %
POST FEV6/FVC RATIO: 97 %
PRE FEV6/FVC RATIO: 98 %
Pre FEV1/FVC ratio: 58 %
RV % pred: 134 %
RV: 2.97 L
TLC % PRED: 106 %
TLC: 5.28 L

## 2016-06-21 MED ORDER — ALBUTEROL SULFATE HFA 108 (90 BASE) MCG/ACT IN AERS: INHALATION_SPRAY | RESPIRATORY_TRACT | 11 refills | Status: DC

## 2016-06-21 NOTE — Patient Instructions (Addendum)
You have GOLD II copd mild/ moderate and unlikely to progress unless you resume smoking.  Only use your albuterol (Proair) as a rescue medication to be used if you can't catch your breath by resting or doing a relaxed purse lip breathing pattern.  Also ok to use 30 min before exercise to see if helps your ex  tolerance  - The less you use it, the better it will work when you need it. - Ok to use up to 2 puffs  every 4 hours if you must but call for immediate appointment if use goes up over your usual need - Don't leave home without it !!  (think of it like the spare tire for your car)    If you are satisfied with your treatment plan,  let your doctor know and he/she can either refill your medications or you can return here when your prescription runs out.     If in any way you are not 100% satisfied,  please tell us.  If 100% better, tell your friends!  Pulmonary follow up is as needed

## 2016-06-21 NOTE — Assessment & Plan Note (Addendum)
Spirometry 02/04/2016  FEV1 0.94  (43%)  Ratio 58   - PFT's  06/21/2016  FEV1 1.30  (61 % ) ratio 59  p 6 % improvement from saba p nothing prior to study with DLCO  86/83 % corrects to 97 % for alv volume   - 06/21/2016  After extensive coaching HFA effectiveness =    90% > changed to prn proair   I had an extended final summary discussion with the patient reviewing all relevant studies completed to date and  lasting 15 to 20 minutes of a 25 minute visit on the following issues:   As I explained to this patient in detail:  although there is copd present, it may not be clinically relevant:   it does not appear to be limiting activity tolerance any more than a set of worn tires limits someone from driving a car  around a parking lot.  A new set of Michelins might look good but would have no perceived impact on the performance of the car and would not be worth the cost.  That is to say:   this pt is so sedentary I don't recommend aggressive pulmonary rx at this point unless limiting symptoms arise or acute exacerbations become as issue, neither of which is the case now.  I asked the patient to contact this office at any time in the future should either of these problems arise.   If worse symptoms or need for saba more than twice daily  would rx with stiolto 2 pffs each am since already understands how to use the respimat    Each maintenance medication was reviewed in detail including most importantly the difference between maintenance and as needed and under what circumstances the prns are to be used.  Please see AVS for specific  Instructions which are unique to this visit and I personally typed out  which were reviewed in detail in writing with the patient and a copy provided.

## 2016-06-21 NOTE — Progress Notes (Signed)
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 lived in [place] since 2006 where moved from [place] where exposed 2003  to cleaning solution of ammonia/bleach admitted only overnight and since then doe x steps/boxes with occ bronchitis prn saba then admit:    Admit date: 01/14/2016 Discharge date: 01/16/2016  Recommendations for Outpatient Follow-up:  Repeat BMET to follow electrolytes and renal function  Reassess BP and adjust antihypertensive regimen as needed Please assure patient has follow up with pulmonary service as instructed Recheck CXR in 3 weeks or so to assure resolution of bronchitic changes and infiltrates  Discharge Diagnoses:  Active Problems:   Essential hypertension   Acute respiratory failure with hypoxia (HCC)   Influenza   Sleep apnea   Viral bronchitis   Dehydration   Hypokalemia   Influenza B   Hyponatremia   Community acquired pneumonia class 1 obesity due to excess calorie intake   Discharge Condition: stable and improved. Discharge home with oxygen supplementation. Will follow up with PCP and with pulmonary service as an outpatient.  Diet recommendation: heart healthy diet and low calorie diet      Filed Weights   01/14/16 2210  Weight: 78.4 kg (172 lb 14.4 oz)    History of present illness:  71 y.o.femalewith medical history significant of HTN, HLD,osteoporosis and Pernacious anemia; who presented with SOB, general malaise, inability to tolerate PO's and weakness. Recently diagnosed with influenza at urgent care (apporx 3-4 days prior to admission) and subsequent worsening symptoms and hypoxia while being assessed at PCP office; At that moment patient was also hypoxic and with increase wheezing. Admitted for further evaluation and treatment of acute resp failure and possible CAP.  Hospital Course:  1-acute resp failure  with hypoxia: due to influenza B and bronchitis/CAP -patient will follow up with pulmonary service for PFT's and further pulmonary evaluation -patient with positive strep pneumoniae in urine -will continue steroids tapering and was discharge on combivent and zithromax  -required oxygen supplementation at discharge (mainly on exertion). -out of therapeutic window for tamiflu and currently afebrile  2-dehydration and hypokalemia -resolved with IVF's and electrolyte repletion    3-essential HTN: -stable -will resume home antihypertensive regimen  4-obesity -Body mass index is 30.15 kg/m. -low calorie and increase exercise discussed with patient   5-HLD -continue statins  -advise to follow heart healthy diet     02/04/2016  f/u ov/[place] Near re: on 02  Chief Complaint  Patient presents with  . Pulmonary Consult    Referred by Hospital. Pt states had PNA and Influzenza recently. She states having SOB with walking up stairs or carrying something heavy. She was discharged from the hospital on o2, but only used this for approx 5 days.   she is almost back to her baseline, not sensing need for combivent at this point, sleeping well also but not using any 02 as rec rec Ok to stop all 02 at this point Work on maintaining perfect  inhaler technique:  relax and gently blow all the way out then take a nice smooth deep breath back in, triggering the inhaler at same time you start breathing in.  Hold for up to 5 seconds if you can  Only use your combivent l as a rescue medication      06/21/2016  f/u ov/[place] Clock re: GOLD II copd / prn combivent  Chief Complaint  Patient presents with  . Follow-up    PFT's done today.  Breathing is doing well. No new co's today. She has not needed combivent.     Doe = MMRC2 = can't walk a nl pace on a flat grade s sob but does fine slow and flat   Only sob on flat surface when having to carry a lot Also sleeping fine  No obvious day to day or daytime variability  or assoc excess/ purulent sputum or mucus plugs or hemoptysis or cp or chest tightness, subjective wheeze or overt sinus or hb symptoms. No unusual exp hx or h/o childhood pna/ asthma or knowledge of premature birth.  Sleeping ok without nocturnal  or early am exacerbation  of respiratory  c/o's or need for noct saba. Also denies any obvious fluctuation of symptoms with weather or environmental changes or other aggravating or alleviating factors except as outlined above   Current Medications, Allergies, Complete Past Medical History, Past Surgical History, Family History, and Social History were reviewed in [employer] record.  ROS  The following are not active complaints unless bolded sore throat, dysphagia, dental problems, itching, sneezing,  nasal congestion or excess/ purulent secretions, ear ache,   fever, chills, sweats, unintended wt loss, classically pleuritic or exertional cp,  orthopnea pnd or leg swelling, presyncope, palpitations, abdominal pain, anorexia, nausea, vomiting, diarrhea  or change in bowel or bladder habits, change in stools or urine, dysuria,hematuria,  rash, arthralgias, visual complaints, headache, numbness, weakness or ataxia or problems with walking or coordination,  change in mood/affect or memory.                           Objective:   Physical Exam   amb pleasant wf nad  06/21/2016          174   02/04/16 172 lb (78 kg)  01/27/16 171 lb 1.6 oz (77.6 kg)  01/14/16 172 lb 14.4 oz (78.4 kg)    Vital signs reviewed - Note on arrival 02 sats  97% on RA     HEENT: nl dentition,   and oropharynx. Nl external ear canals without cough reflex - mild bilateral non-specific turbinate edema     NECK :  without JVD/Nodes/TM/ nl carotid upstrokes bilaterally   LUNGS: no acc muscle use,  Nl contour chest with distant bs/  Mild increased exp time, no wheezes     CV:  RRR  no s3 or murmur or increase in P2, nad no edema   ABD:  soft  and nontender with nl inspiratory excursion in the supine position. No bruits or organomegaly appreciated, bowel sounds nl  MS:  Nl gait/ ext warm without deformities, calf tenderness, cyanosis or clubbing No obvious joint restrictions   SKIN: warm and dry without lesions    NEURO:  alert, approp, nl sensorium with  no motor or cerebellar deficits apparent.        Assessment:

## 2016-06-21 NOTE — Progress Notes (Signed)
PFT done today. 

## 2016-06-24 ENCOUNTER — Other Ambulatory Visit: Payer: Self-pay | Admitting: Family Medicine

## 2016-06-27 NOTE — Telephone Encounter (Signed)
Dr. Maudie Mercury is not the prescribing physician. Can she find out who prescribed it and follow up with them for refills.

## 2016-07-10 DIAGNOSIS — R69 Illness, unspecified: Secondary | ICD-10-CM | POA: Diagnosis not present

## 2016-07-11 DIAGNOSIS — R69 Illness, unspecified: Secondary | ICD-10-CM | POA: Diagnosis not present

## 2016-08-07 DIAGNOSIS — R0602 Shortness of breath: Secondary | ICD-10-CM | POA: Diagnosis not present

## 2016-08-07 DIAGNOSIS — E669 Obesity, unspecified: Secondary | ICD-10-CM | POA: Diagnosis not present

## 2016-08-07 DIAGNOSIS — Z0000 Encounter for general adult medical examination without abnormal findings: Secondary | ICD-10-CM | POA: Diagnosis not present

## 2016-08-07 DIAGNOSIS — H9110 Presbycusis, unspecified ear: Secondary | ICD-10-CM | POA: Diagnosis not present

## 2016-08-07 DIAGNOSIS — Z6831 Body mass index (BMI) 31.0-31.9, adult: Secondary | ICD-10-CM | POA: Diagnosis not present

## 2016-08-07 DIAGNOSIS — K599 Functional intestinal disorder, unspecified: Secondary | ICD-10-CM | POA: Diagnosis not present

## 2016-08-07 DIAGNOSIS — E876 Hypokalemia: Secondary | ICD-10-CM | POA: Diagnosis not present

## 2016-08-07 DIAGNOSIS — D519 Vitamin B12 deficiency anemia, unspecified: Secondary | ICD-10-CM | POA: Diagnosis not present

## 2016-08-07 DIAGNOSIS — I10 Essential (primary) hypertension: Secondary | ICD-10-CM | POA: Diagnosis not present

## 2016-08-07 DIAGNOSIS — E7800 Pure hypercholesterolemia, unspecified: Secondary | ICD-10-CM | POA: Diagnosis not present

## 2016-08-26 ENCOUNTER — Other Ambulatory Visit: Payer: Self-pay | Admitting: Family Medicine

## 2016-08-27 ENCOUNTER — Other Ambulatory Visit: Payer: Self-pay | Admitting: Family Medicine

## 2016-09-15 NOTE — Progress Notes (Signed)
HPI:  Cassandra James is a pleasant 71 y.o. here for follow up. Chronic medical problems summarized below were reviewed for changes. Reports she is doing well. No new complaints today. She is trying to get some exercise. Does not want to get her flu shot today, as would like to get that later. Reports her valsartan was not on the recall list, she checked with her pharmacy. She has opted to continue this.Denies CP, SOB, DOE, treatment intolerance or new symptoms. Due for flu shot, hep c screening  COPD: -seeing Dr. Melvyn Novas -sleep apnea on list - she denies any hx of this and denies ever testing, denies snoring, daytime somnolence or unrestful sleep  HTN: -chronic -meds: asa, valsartan-hctz 320-25 - reports her valsartan NOT on recall list so continues -denies: Chest pain, swelling, headache, palpitations  Hyperglycemia: -in prediabetes range -treated with diet and exercise -denies: Polyuria, polydipsia, worsening vision  HLD: -meds: crestor 20mg , only taking 10 mg daily -diet and exercise: No regular exercise, diet is so-so -denies: leg cramps  B12 deficiency: -Uses B12 injections -reported hx pernicious anemia - all cbcs in chart back to 6 years ago nromal  Right knee pain: -Seeing [facility]  Osteoporosis: -monitored by her gynecologist, Dr. Matthew Saras  IBS: -used to see Dr. Deatra Ina - but he has retired -reports uses levsin rarel -stable  ROS: See pertinent positives and negatives per HPI.  Past Medical History:  Diagnosis Date  . Cataract   . Colon polyps    history of  . COLONIC POLYPS, HX OF 08/09/2006   Qualifier: Diagnosis of  By: Scherrie Gerlach    . DECREASED HEARING 11/25/2008   Qualifier: Diagnosis of  By: Scherrie Gerlach    . Elevated LFTs    obstructive disease  . HIATAL HERNIA 02/05/2009   Qualifier: Diagnosis of  By: Harriet Pho    . Hyperglycemia 11/10/2013  . Hyperlipidemia   . Hypertension   . Osteoporosis    was  osteopenia-last bone scan all clear   . Pernicious anemia   . Sleep apnea 01/14/2016    Past Surgical History:  Procedure Laterality Date  . [place] RELEASE Left 2016  . cataract surgery  2011   Bilateral  . COLONOSCOPY    . HAND TENDON SURGERY  01/16/2014   Dr Burney Gauze  . OVARIAN CYST SURGERY  1968  . RETINAL DETACHMENT SURGERY  2011   left  . TOENAIL EXCISION Right    Dr Patrick Jupiter great toenail excision  . TRAPEZIUM RESECTION Left 2016   left wrist   . TUBAL LIGATION  1979    Family History  Problem Relation Age of Onset  . Pernicious anemia Mother   . Cancer Father        lung/ bone  . Osteoporosis Father   . Hypothyroidism Father   . Colon cancer Sister   . Cancer - Colon Sister   . Colon polyps Neg Hx   . Rectal cancer Neg Hx   . Stomach cancer Neg Hx     Social History   Social History  . Marital status: Married    Spouse name: N/A  . Number of children: N/A  . Years of education: N/A   Social History Main Topics  . Smoking status: Former Smoker    Quit date: 01/17/2000  . Smokeless tobacco: Never Used     Comment: smoking hx unknown at this time; started in college   .  Alcohol use 1.2 - 1.8 oz/week    2 - 3 Standard drinks or equivalent per week     Comment: 3 times a week   . Drug use: No  . Sexual activity: Not Asked   Other Topics Concern  . 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  albuterol (PROAIR HFA) 108 (90 Base) MCG/ACT inhaler, 2 puffs every 4 hours as needed only  if your can't catch your breath, Disp: 1 Inhaler, Rfl: 11 .  aspirin 81 MG chewable tablet, 1/2 tablet every other day, Disp: , Rfl:  .  cholecalciferol  (VITAMIN D) 1000 units tablet, Take 1,000 Units by mouth daily., Disp: , Rfl:  .  cyanocobalamin (,VITAMIN B-12,) 1000 MCG/ML injection, Inject 1 mL (1,000 mcg total) into the muscle every 30 (thirty) days., Disp: 1 mL, Rfl: 12 .  hyoscyamine (LEVSIN SL) 0.125 MG SL tablet, Place 0.25 mg under the tongue every 4 (four) hours as needed for cramping., Disp: , Rfl:  .  KLOR-CON M20 20 MEQ tablet, TAKE 1 TABLET BY MOUTH EVERY DAY, Disp: 90 tablet, Rfl: 1 .  rosuvastatin (CRESTOR) 20 MG tablet, TAKE 1 TABLET BY MOUTH EVERY DAY, Disp: 90 tablet, Rfl: 1 .  valsartan-hydrochlorothiazide (DIOVAN-HCT) 320-25 MG tablet, TAKE 1 TABLET BY MOUTH DAILY., Disp: 90 tablet, Rfl: 1  EXAM:  Vitals:   09/21/16 0920  BP: 116/60  Pulse: 77  Temp: 97.6 F (36.4 C)  SpO2: 94%    Body mass index is 31.64 kg/m.  GENERAL: vitals reviewed and listed above, alert, oriented, appears well hydrated and in no acute distress  HEENT: atraumatic, conjunttiva clear, no obvious abnormalities on inspection of external nose and ears  NECK: no obvious masses on inspection  LUNGS: clear to auscultation bilaterally, no wheezes, rales or rhonchi, good air movement  CV: HRRR, no peripheral edema  MS: moves all extremities without noticeable abnormality  PSYCH: pleasant and cooperative, no obvious depression or anxiety  ASSESSMENT AND PLAN:  Discussed the following assessment and plan:  Essential hypertension  Hyperlipidemia, unspecified hyperlipidemia type  COPD GOLD III  BMI 31.0-31.9,adult  -lifestyle recommendations - advised a healthy whole foods based diet and regular exercise -Discussed treatment options for osteoarthritis and advised weight reduction, healthy diet, topical over-the-counter options to minimize oral medications for pain, she does see specialists for this -She opted to hold off on labs until next visit -Wants to get flu shot  In October -Patient advised to return or notify a doctor  immediately if symptoms worsen or persist or new concerns arise.  Patient Instructions  BEFORE YOU LEAVE: -follow up: 3 months  Flu shot in October.   We recommend the following healthy lifestyle for LIFE: 1) Small portions.   Tip: eat off of a salad plate instead of a dinner plate.  Tip: It is ok to feel hungry after a meal - that likely means you ate an appropriate portion.  Tip: if you need more or a snack choose fruits, veggies and/or a handful of nuts or seeds.  2) Eat a healthy clean diet.   TRY  TO EAT: -at least 5-7 servings of low sugar vegetables per day (not corn, potatoes or bananas.) -berries are the best choice if you wish to eat fruit.   -lean meets (fish, chicken or [place] breasts) -vegan proteins for some meals - beans or tofu, whole grains, nuts and seeds -Replace bad fats with good fats - good fats include: fish, nuts and seeds, canola oil, olive oil -small amounts of low fat or non fat dairy -small amounts of100 % whole grains - check the lables  AVOID: -SUGAR, sweets, anything with added sugar, corn syrup or sweeteners -if you must have a sweetener, small amounts of stevia may be best -sweetened beverages -simple starches (rice, bread, potatoes, pasta, chips, etc - small amounts of 100% whole grains are ok) -red meat, pork, butter -fried foods, fast food, processed food, excessive dairy, eggs and coconut.  3)Get at least 150 minutes of sweaty aerobic exercise per week.  4)Reduce stress - consider counseling, meditation and relaxation to balance other aspects of your life.  WE NOW OFFER   [facility] FAST TRACK!!!  SAME DAY Appointments for ACUTE CARE  Such as: Sprains, Injuries, cuts, abrasions, rashes, muscle pain, joint pain, back pain Colds, flu, sore throats, headache, allergies, cough, fever  Ear pain, sinus and eye infections Abdominal pain, nausea, vomiting, diarrhea, upset stomach Animal/insect bites  3 Easy Ways to  Schedule: Walk-In Scheduling Call in scheduling Mychart Sign-up: [email or web address]            Colin Benton R., DO

## 2016-09-21 ENCOUNTER — Ambulatory Visit (INDEPENDENT_AMBULATORY_CARE_PROVIDER_SITE_OTHER): Payer: Medicare HMO | Admitting: Family Medicine

## 2016-09-21 ENCOUNTER — Encounter: Payer: Self-pay | Admitting: Family Medicine

## 2016-09-21 VITALS — BP 116/60 | HR 77 | Temp 97.6°F | Ht 63.0 in | Wt 178.6 lb

## 2016-09-21 DIAGNOSIS — J449 Chronic obstructive pulmonary disease, unspecified: Secondary | ICD-10-CM | POA: Diagnosis not present

## 2016-09-21 DIAGNOSIS — E785 Hyperlipidemia, unspecified: Secondary | ICD-10-CM | POA: Diagnosis not present

## 2016-09-21 DIAGNOSIS — I10 Essential (primary) hypertension: Secondary | ICD-10-CM | POA: Diagnosis not present

## 2016-09-21 DIAGNOSIS — Z6831 Body mass index (BMI) 31.0-31.9, adult: Secondary | ICD-10-CM

## 2016-09-21 NOTE — Patient Instructions (Signed)
BEFORE YOU LEAVE: -follow up: 3 months  Flu shot in October.   We recommend the following healthy lifestyle for LIFE: 1) Small portions.   Tip: eat off of a salad plate instead of a dinner plate.  Tip: It is ok to feel hungry after a meal - that likely means you ate an appropriate portion.  Tip: if you need more or a snack choose fruits, veggies and/or a handful of nuts or seeds.  2) Eat a healthy clean diet.   TRY TO EAT: -at least 5-7 servings of low sugar vegetables per day (not corn, potatoes or bananas.) -berries are the best choice if you wish to eat fruit.   -lean meets (fish, chicken or [place] breasts) -vegan proteins for some meals - beans or tofu, whole grains, nuts and seeds -Replace bad fats with good fats - good fats include: fish, nuts and seeds, canola oil, olive oil -small amounts of low fat or non fat dairy -small amounts of100 % whole grains - check the lables  AVOID: -SUGAR, sweets, anything with added sugar, corn syrup or sweeteners -if you must have a sweetener, small amounts of stevia may be best -sweetened beverages -simple starches (rice, bread, potatoes, pasta, chips, etc - small amounts of 100% whole grains are ok) -red meat, pork, butter -fried foods, fast food, processed food, excessive dairy, eggs and coconut.  3)Get at least 150 minutes of sweaty aerobic exercise per week.  4)Reduce stress - consider counseling, meditation and relaxation to balance other aspects of your life.  WE NOW OFFER   [facility] FAST TRACK!!!  SAME DAY Appointments for ACUTE CARE  Such as: Sprains, Injuries, cuts, abrasions, rashes, muscle pain, joint pain, back pain Colds, flu, sore throats, headache, allergies, cough, fever  Ear pain, sinus and eye infections Abdominal pain, nausea, vomiting, diarrhea, upset stomach Animal/insect bites  3 Easy Ways to Schedule: Walk-In Scheduling Call in scheduling Mychart Sign-up:  [email or web address]

## 2016-10-05 ENCOUNTER — Encounter: Payer: Self-pay | Admitting: Family Medicine

## 2016-10-24 ENCOUNTER — Ambulatory Visit (INDEPENDENT_AMBULATORY_CARE_PROVIDER_SITE_OTHER): Payer: Medicare HMO | Admitting: *Deleted

## 2016-10-24 ENCOUNTER — Ambulatory Visit: Payer: Medicare HMO

## 2016-10-24 DIAGNOSIS — Z23 Encounter for immunization: Secondary | ICD-10-CM | POA: Diagnosis not present

## 2016-11-11 NOTE — Progress Notes (Signed)
HPI:  Acute visit for:  Thoracic back pain: -Started 2 weeks ago  -left mid back pain at bra line -mild discomfort, worsened by flexion -Has not required any treatment -No weakness, numbness, radiation, fever, malaise  COPD: -Requests refill of Combivent, reports was prescribed by Dr. Melvyn Novas in the past for as needed use  ROS: See pertinent positives and negatives per HPI.  Past Medical History:  Diagnosis Date  . Cataract   . Colon polyps    history of  . COLONIC POLYPS, HX OF 08/09/2006   Qualifier: Diagnosis of  By: Scherrie Gerlach    . DECREASED HEARING 11/25/2008   Qualifier: Diagnosis of  By: Scherrie Gerlach    . Elevated LFTs    obstructive disease  . HIATAL HERNIA 02/05/2009   Qualifier: Diagnosis of  By: Harriet Pho    . Hyperglycemia 11/10/2013  . Hyperlipidemia   . Hypertension   . Osteoporosis    was osteopenia-last bone scan all clear   . Pernicious anemia   . Sleep apnea 01/14/2016    Past Surgical History:  Procedure Laterality Date  . [place] RELEASE Left 2016  . cataract surgery  2011   Bilateral  . COLONOSCOPY    . HAND TENDON SURGERY  01/16/2014   Dr Burney Gauze  . OVARIAN CYST SURGERY  1968  . RETINAL DETACHMENT SURGERY  2011   left  . TOENAIL EXCISION Right    Dr Patrick Jupiter great toenail excision  . TRAPEZIUM RESECTION Left 2016   left wrist   . TUBAL LIGATION  1979    Family History  Problem Relation Age of Onset  . Pernicious anemia Mother   . Cancer Father        lung/ bone  . Osteoporosis Father   . Hypothyroidism Father   . Colon cancer Sister   . Cancer - Colon Sister   . Colon polyps Neg Hx   . Rectal cancer Neg Hx   . Stomach cancer Neg Hx     Social History   Social History  . Marital status: Married    Spouse name: N/A  . Number of children: N/A  . Years of education: N/A   Social History Main Topics  . Smoking status: Former Smoker    Quit date: 01/17/2000  .  Smokeless tobacco: Never Used     Comment: smoking hx unknown at this time; started in college   . Alcohol use 1.2 - 1.8 oz/week    2 - 3 Standard drinks or equivalent per week     Comment: 3 times a week   . Drug use: No  . Sexual activity: Not Asked   Other Topics Concern  . 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  albuterol (PROAIR HFA) 108 (90 Base) MCG/ACT inhaler, 2 puffs every 4 hours as needed only  if your can't catch your breath, Disp: 1 Inhaler, Rfl: 11 .  aspirin 81 MG chewable tablet, 1/2 tablet every other day, Disp: , Rfl:  .  cholecalciferol (VITAMIN D)  1000 units tablet, Take 1,000 Units by mouth daily., Disp: , Rfl:  .  cyanocobalamin (,VITAMIN B-12,) 1000 MCG/ML injection, Inject 1 mL (1,000 mcg total) into the muscle every 30 (thirty) days., Disp: 1 mL, Rfl: 12 .  hyoscyamine (LEVSIN SL) 0.125 MG SL tablet, Place 0.25 mg under the tongue every 4 (four) hours as needed for cramping., Disp: , Rfl:  .  KLOR-CON M20 20 MEQ tablet, TAKE 1 TABLET BY MOUTH EVERY DAY, Disp: 90 tablet, Rfl: 1 .  rosuvastatin (CRESTOR) 20 MG tablet, TAKE 1 TABLET BY MOUTH EVERY DAY, Disp: 90 tablet, Rfl: 1 .  valsartan-hydrochlorothiazide (DIOVAN-HCT) 320-25 MG tablet, TAKE 1 TABLET BY MOUTH DAILY., Disp: 90 tablet, Rfl: 1  EXAM:  Vitals:   11/13/16 0758  BP: (!) 100/54  Pulse: 64  Temp: 97.8 F (36.6 C)    Body mass index is 31.9 kg/m.  GENERAL: vitals reviewed and listed above, alert, oriented, appears well hydrated and in no acute distress  HEENT: atraumatic, conjunttiva clear, no obvious abnormalities on inspection of external nose and ears  NECK: no obvious masses on inspection  MS: moves all  extremities without noticeable abnormality, normal inspection of the spine without any rashes or masses appreciated, tenderness to palpation in the soft tissues in the lower thoracic paraspinal region, right at the bra line, no bony tenderness to palpation, negative facet loading  PSYCH: pleasant and cooperative, no obvious depression or anxiety  ASSESSMENT AND PLAN:  Discussed the following assessment and plan:  Acute left-sided thoracic back pain -Discussed potential etiologies -Opted to start with home exercises, Cassandra James video, Tiger balm, heat as needed -Advised follow-up in 2-4 weeks if symptoms do not resolve  COPD GOLD III -Rx provided, written on prescription pad as the computers were not working at the time of this visit  -Patient advised to return or notify a doctor immediately if symptoms worsen or persist or new concerns arise.  There are no Patient Instructions on file for this visit.  Colin Benton R., DO

## 2016-11-13 ENCOUNTER — Encounter: Payer: Self-pay | Admitting: Family Medicine

## 2016-11-13 ENCOUNTER — Ambulatory Visit (INDEPENDENT_AMBULATORY_CARE_PROVIDER_SITE_OTHER): Payer: Medicare HMO | Admitting: Family Medicine

## 2016-11-13 VITALS — BP 100/54 | HR 64 | Temp 97.8°F | Ht 63.0 in | Wt 180.1 lb

## 2016-11-13 DIAGNOSIS — J449 Chronic obstructive pulmonary disease, unspecified: Secondary | ICD-10-CM | POA: Diagnosis not present

## 2016-11-13 DIAGNOSIS — M546 Pain in thoracic spine: Secondary | ICD-10-CM

## 2016-11-22 IMAGING — CT CT PELVIS W/ CM
1 series · 16 of 32 positions shown, 20 images · IV contrast (OMNIPAQUE)
Comparison: None.

CLINICAL DATA: Chronic left lower quadrant pain for 1 year.
Appendectomy in 0983.

EXAM:
CT PELVIS WITH CONTRAST
TECHNIQUE: Multidetector CT imaging of the pelvis was performed using the
standard protocol following the bolus administration of intravenous
contrast.
CONTRAST:  100mL OMNIPAQUE IOHEXOL 300 MG/ML  SOLN

[Series 2: routine abdomen/pelvis with · axial · 0.93mm/px · z∈[-477,-247]mm · 16 of 52 slices shown, 20 images]
[im 4/52  soft-tissue]
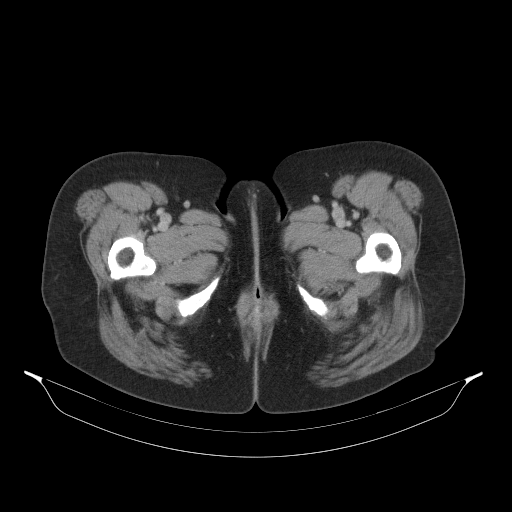
[im 4/52  bone]
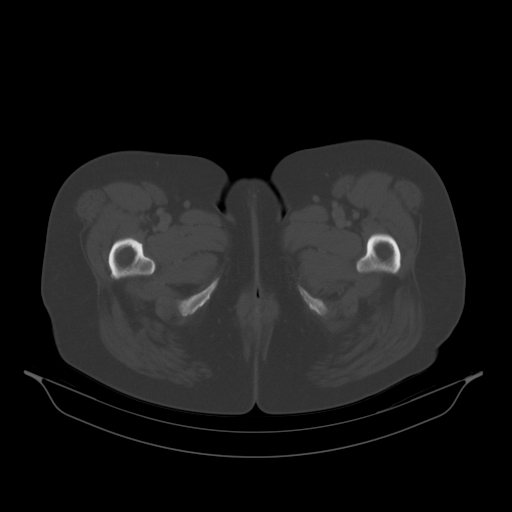
[im 7/52  soft-tissue]
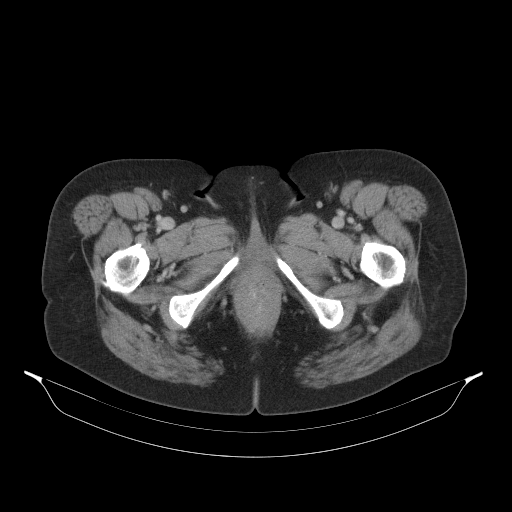
[im 10/52  soft-tissue]
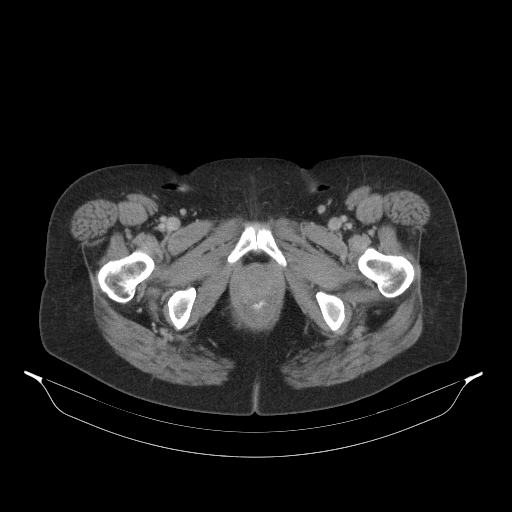
[im 14/52  soft-tissue]
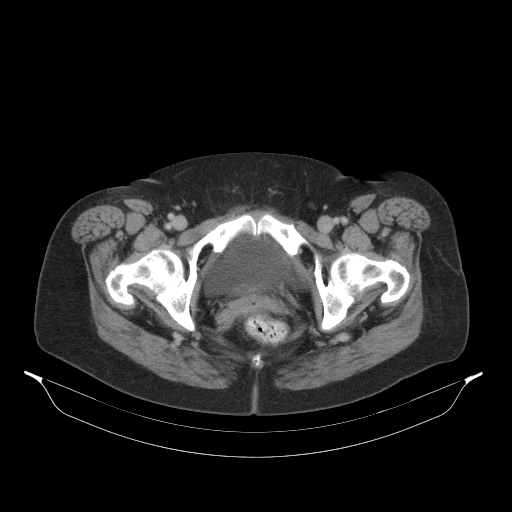
[im 17/52  soft-tissue]
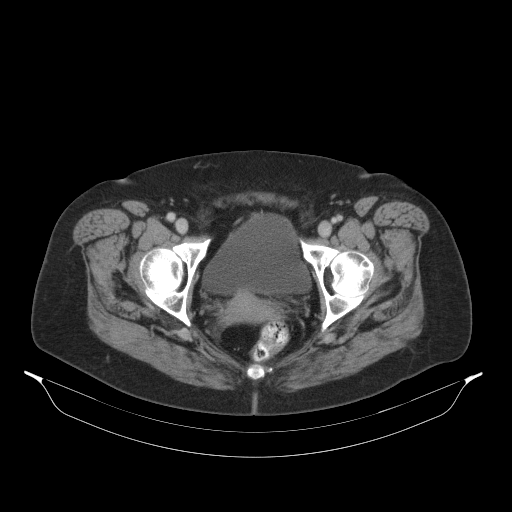
[im 20/52  soft-tissue]
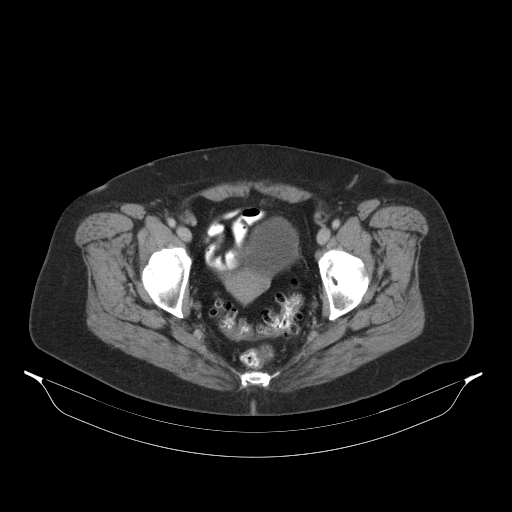
[im 24/52  soft-tissue]
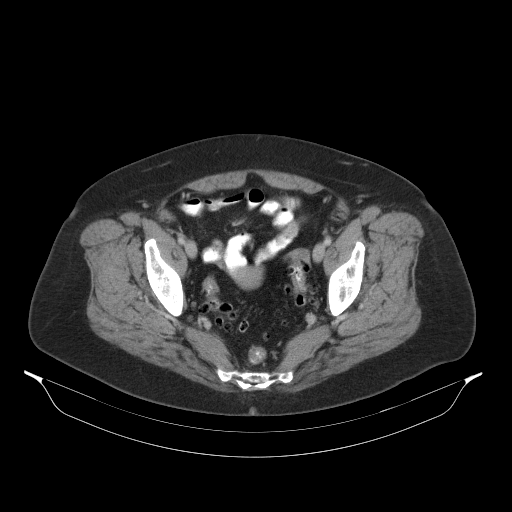
[im 28/52  soft-tissue]
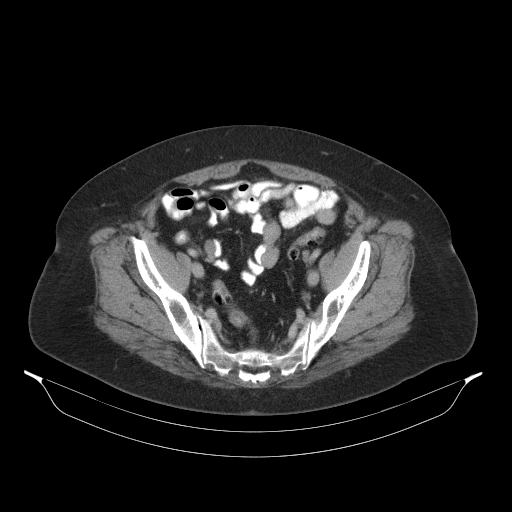
[im 32/52  soft-tissue]
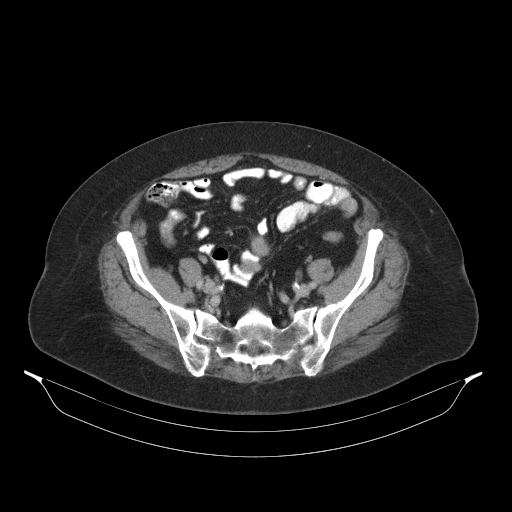
[im 32/52  bone]
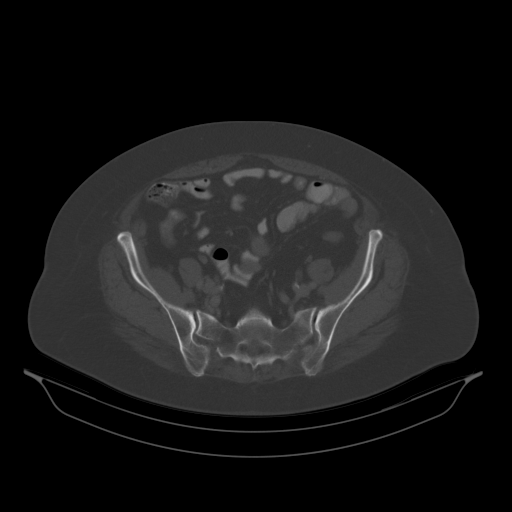
[im 35/52  soft-tissue]
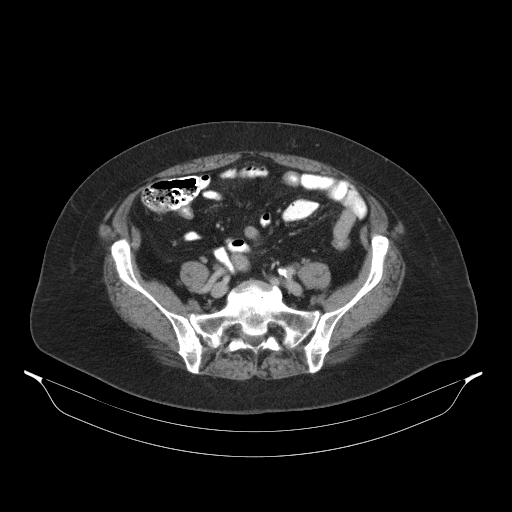
[im 38/52  soft-tissue]
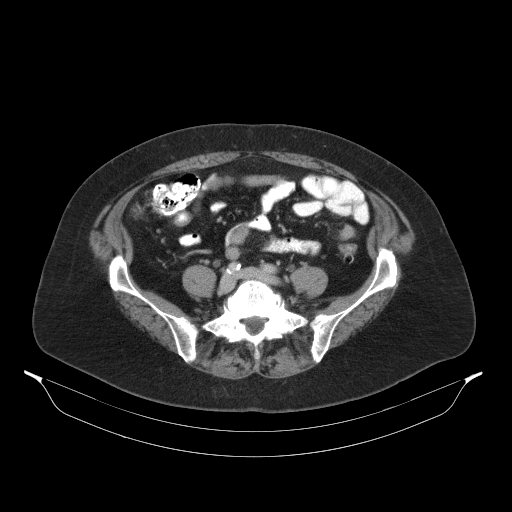
[im 42/52  soft-tissue]
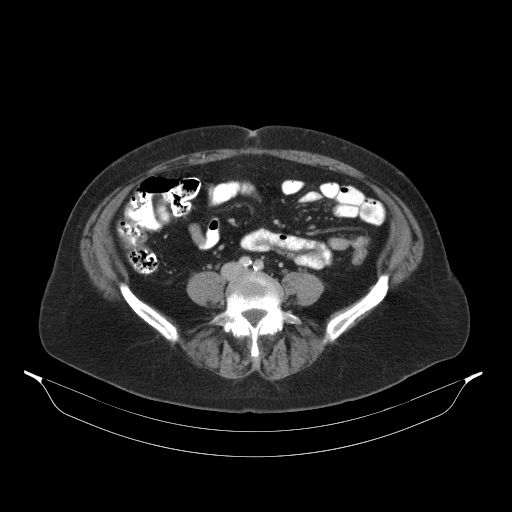
[im 45/52  soft-tissue]
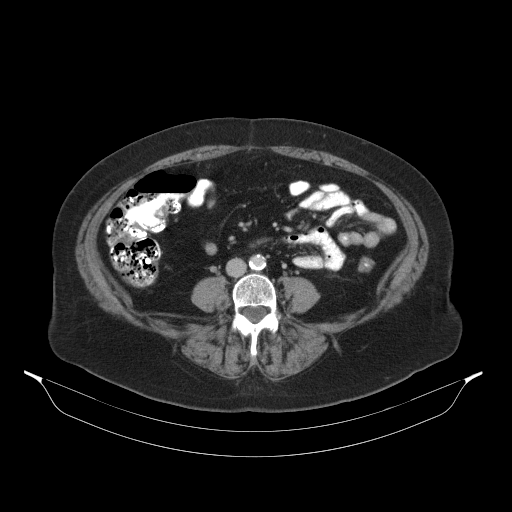
[im 45/52  lung]
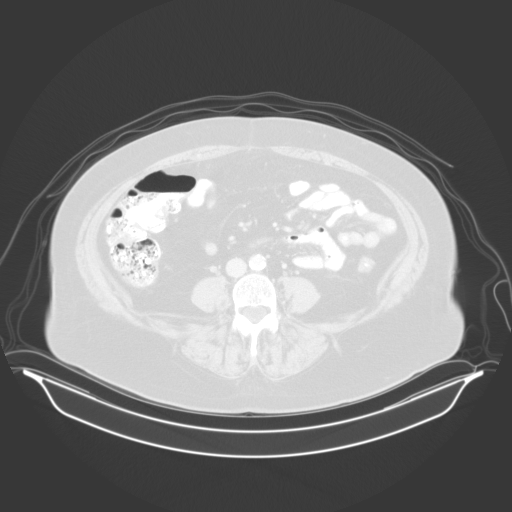
[im 47/52  lung]
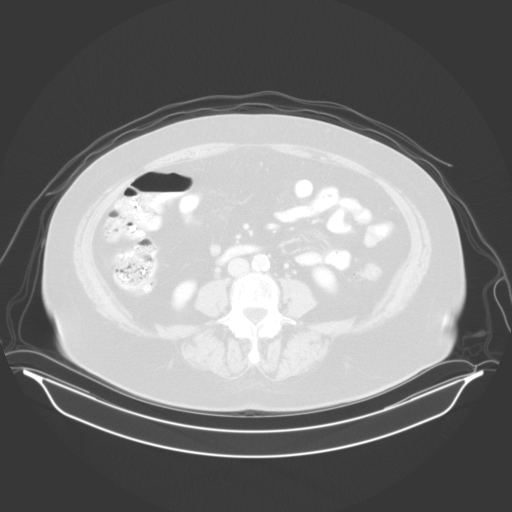
[im 48/52  soft-tissue]
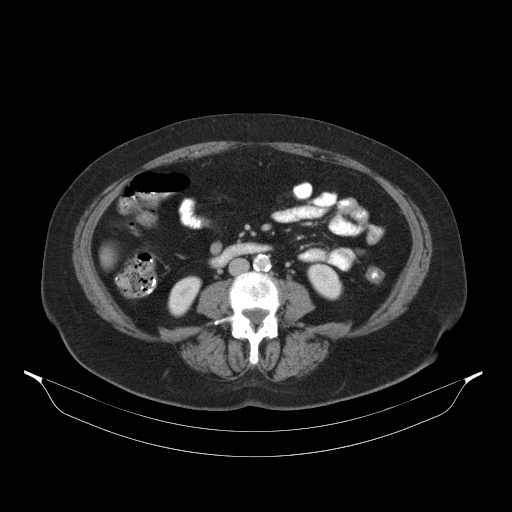
[im 48/52  lung]
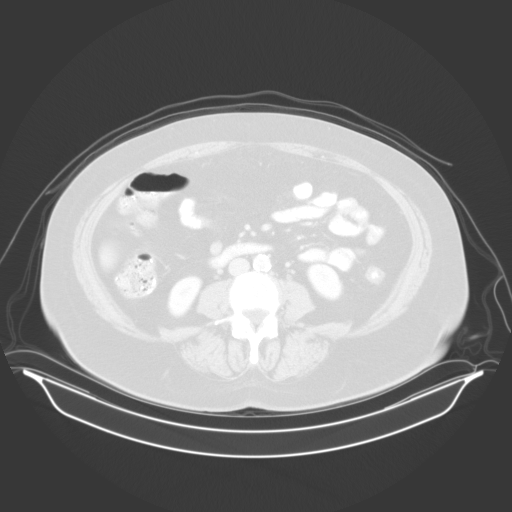
[im 50/52  lung]
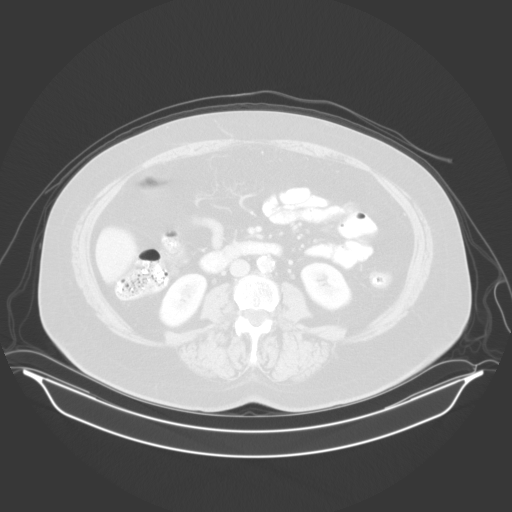

[16 of 32 positions shown; findings below may reference images not displayed]

FINDINGS: Extensive diverticulosis within the sigmoid and descending colon. No
evidence of diverticulitis. Image right-sided Unremarkable. Normal
terminal ileum. Normal pelvic small bowel.

Pelvic atherosclerosis.  No pelvic adenopathy.

Mild pelvic floor laxity. Otherwise, normal appearance of the
urinary bladder and uterus. No adnexal mass.

Degenerative disc disease involves the lumbosacral junction.
IMPRESSION: 1.  No acute process or explanation for left lower quadrant pain.
2. Mild pelvic floor laxity.

## 2016-12-18 NOTE — Progress Notes (Signed)
HPI:  Follow-up.  Her only new concern reported today is chronic foot pain.  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  Tries to wear comfortable shoes.  This is not terrible, but at some point she wants to see a specialist to see if there is any way to correct this or prevent it.  He does have a podiatrist she sees for her toenails, but it seems she has not discussed this issue with them.   Her back does better when she does the exercises be provided, but she has not been doing them.  Pain had resolved, then she cleaned baseboards thoracic paraspinal muscles.,  Fever or malaise Otherwise feels well, without shortness of breath, chest pain, increased swelling or other concerns.  She is aware of the medication recolonized with her pharmacy about her medicine.  Does not think is impacted. Due for labs:b12, bmp, cbc, hgba1c, hep c screen AWV 03/2016  COPD: -seeing Dr. Melvyn Novas -sleep apnea on list - she denies any hx of this and denies ever testing, denies snoring, daytime somnolence or unrestful sleep  HTN: -chronic -meds: asa, valsartan-hctz 320-25 - reports her valsartan NOT on recall list so continues  Hyperglycemia: -in prediabetes range -treated with diet and exercise -denies: Polyuria, polydipsia, worsening vision  HLD: -meds: crestor 20mg , only taking 10 mg daily -diet and exercise: No regular exercise, diet is so-so -denies: leg cramps  B12 deficiency: -Uses B12 injections -reported hx pernicious anemia - all cbcs in chart back to 6 years ago nromal  Right knee pain: -Seeing [facility]  Osteoporosis: -monitored by her gynecologist, Dr. Matthew Saras  IBS: -used to see Dr. Deatra Ina - but he has retired -reports uses levsin rarel -stable   ROS: See pertinent positives and negatives per HPI.  Past Medical History:  Diagnosis Date  . Cataract   . Colon polyps    history of  . COLONIC POLYPS, HX OF 08/09/2006   Qualifier: Diagnosis of  By: Scherrie Gerlach    .  DECREASED HEARING 11/25/2008   Qualifier: Diagnosis of  By: Scherrie Gerlach    . Elevated LFTs    obstructive disease  . HIATAL HERNIA 02/05/2009   Qualifier: Diagnosis of  By: Harriet Pho    . Hyperglycemia 11/10/2013  . Hyperlipidemia   . Hypertension   . Osteoporosis    was osteopenia-last bone scan all clear   . Pernicious anemia   . Sleep apnea 01/14/2016    Past Surgical History:  Procedure Laterality Date  . [place] RELEASE Left 2016  . cataract surgery  2011   Bilateral  . COLONOSCOPY    . HAND TENDON SURGERY  01/16/2014   Dr Burney Gauze  . OVARIAN CYST SURGERY  1968  . RETINAL DETACHMENT SURGERY  2011   left  . TOENAIL EXCISION Right    Dr Patrick Jupiter great toenail excision  . TRAPEZIUM RESECTION Left 2016   left wrist   . TUBAL LIGATION  1979    Family History  Problem Relation Age of Onset  . Pernicious anemia Mother   . Cancer Father        lung/ bone  . Osteoporosis Father   . Hypothyroidism Father   . Colon cancer Sister   . Cancer - Colon Sister   . Colon polyps Neg Hx   . Rectal cancer Neg Hx   . Stomach cancer Neg Hx     Social History   Socioeconomic History  .  Marital status: Married    Spouse name: None  . Number of children: None  . Years of education: None  . Highest education level: None  Social Needs  . Financial resource strain: None  . Food insecurity - worry: None  . Food insecurity - inability: None  . Transportation needs - medical: None  . Transportation needs - non-medical: None  Occupational History  . None  Tobacco Use  . Smoking status: Former Smoker    Last attempt to quit: 01/17/2000    Years since quitting: 16.9  . Smokeless tobacco: Never Used  . Tobacco comment: smoking hx unknown at this time; started in college   Substance and Sexual Activity  . Alcohol use: Yes    Alcohol/week: 1.2 - 1.8 oz    Types: 2 - 3 Standard drinks or equivalent per week    Comment: 3  times a week   . Drug use: No  . Sexual activity: None  Other Topics Concern  . 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  albuterol (PROAIR HFA) 108 (90 Base) MCG/ACT inhaler, 2 puffs every 4 hours as needed only  if your can't catch your breath, Disp: 1 Inhaler, Rfl: 11 .  aspirin 81 MG chewable tablet, 1/2 tablet every other day, Disp: , Rfl:  .  cholecalciferol (VITAMIN D) 1000 units tablet, Take 1,000 Units by mouth daily., Disp: , Rfl:  .  cyanocobalamin (,VITAMIN B-12,) 1000 MCG/ML injection, Inject 1 mL (1,000 mcg total) into the muscle every 30 (thirty) days., Disp: 1 mL, Rfl: 12 .  hyoscyamine (LEVSIN SL) 0.125 MG SL tablet, Place 0.25 mg under the tongue every 4 (four) hours as needed for cramping., Disp: , Rfl:  .  KLOR-CON M20 20 MEQ tablet, TAKE 1 TABLET BY MOUTH EVERY DAY, Disp: 90 tablet, Rfl: 1 .  rosuvastatin (CRESTOR) 20 MG tablet, TAKE 1 TABLET BY MOUTH EVERY DAY, Disp: 90 tablet, Rfl: 1 .  valsartan-hydrochlorothiazide (DIOVAN-HCT) 320-25 MG tablet, TAKE 1 TABLET BY MOUTH DAILY., Disp: 90 tablet, Rfl: 1  EXAM:  Vitals:   12/19/16 0839  BP: (!) 116/52  Pulse: 60  Temp: 98 F (36.7 C)    Body mass index is 31.87 kg/m.  GENERAL: vitals reviewed and listed above, alert, oriented, appears well hydrated and in no acute distress  HEENT: atraumatic, conjunttiva clear, no obvious abnormalities on inspection of external nose and ears  NECK: no obvious masses on inspection  LUNGS: clear to auscultation bilaterally, no wheezes, rales or rhonchi, good air movement  CV: HRRR, no peripheral edema  MS: moves all extremities without noticeable abnormality  PSYCH: pleasant and cooperative, no  obvious depression or anxiety  ASSESSMENT AND PLAN:  Discussed the following assessment and plan:  Essential hypertension  Hyperlipidemia, unspecified hyperlipidemia type  B12 deficiency  Hyperglycemia  Chronic bilateral thoracic back pain  Pain in both feet  -Labs today -Lifestyle recommendations -Advised to do the back exercises 6 days/week and get regular exercise, she is to follow-up with [place] if the back pain persists or worsens or new concerns occur -Otherwise follow-up in March  with her annual wellness visit with Manuela Schwartz then as well  Patient Instructions  BEFORE YOU LEAVE: -Labs  -follow up: Annual wellness visit with Manuela Schwartz and CPE with Dr. Maudie Mercury in March, same day  Do the back exercises at least 6 days/week and with please try to get regular aerobic exercise as well.  Please call us if this back pain worsens or does not resolve with the exercises.  Please follow-up with your podiatrist regarding the foot pain.  We have ordered labs or studies at this visit. It can take up to 1-2 weeks for results and processing. IF results require follow up or explanation, we will call you with instructions. Clinically stable results will be released to your [facility]. If you have not heard from [place] or cannot find your results in [facility] in 2 weeks please contact our office at [phone].  If you are not yet signed up for [facility], please consider signing up.   We recommend the following healthy lifestyle for LIFE: 1) Small portions. But, make sure to get regular (at least 3 per day), healthy meals and small healthy snacks if needed.  2) Eat a healthy clean diet.   TRY TO EAT: -at least 5-7 servings of low sugar, colorful, and nutrient rich vegetables per day (not corn, potatoes or bananas.) -berries are the best choice if you wish to eat fruit (only eat small amounts if trying to reduce weight)  -lean meets (fish, white meat of chicken or [place]) -vegan proteins for some meals - beans  or tofu, whole grains, nuts and seeds -Replace bad fats with good fats - good fats include: fish, nuts and seeds, canola oil, olive oil -small amounts of low fat or non fat dairy -small amounts of100 % whole grains - check the lables -drink plenty of water  AVOID: -SUGAR, sweets, anything with added sugar, corn syrup or sweeteners - must read labels as even foods advertised as "healthy" often are loaded with sugar -if you must have a sweetener, small amounts of stevia may be best -sweetened beverages and artificially sweetened beverages -simple starches (rice, bread, potatoes, pasta, chips, etc - small amounts of 100% whole grains are ok) -red meat, pork, butter -fried foods, fast food, processed food, excessive dairy, eggs and coconut.  3)Get at least 150 minutes of sweaty aerobic exercise per week.  4)Reduce stress - consider counseling, meditation and relaxation to balance other aspects of your life.          Colin Benton R., DO

## 2016-12-19 ENCOUNTER — Encounter: Payer: Self-pay | Admitting: Family Medicine

## 2016-12-19 ENCOUNTER — Ambulatory Visit (INDEPENDENT_AMBULATORY_CARE_PROVIDER_SITE_OTHER): Payer: Medicare HMO | Admitting: Family Medicine

## 2016-12-19 VITALS — BP 116/52 | HR 60 | Temp 98.0°F | Ht 63.0 in | Wt 179.9 lb

## 2016-12-19 DIAGNOSIS — E538 Deficiency of other specified B group vitamins: Secondary | ICD-10-CM

## 2016-12-19 DIAGNOSIS — E785 Hyperlipidemia, unspecified: Secondary | ICD-10-CM

## 2016-12-19 DIAGNOSIS — M546 Pain in thoracic spine: Secondary | ICD-10-CM | POA: Diagnosis not present

## 2016-12-19 DIAGNOSIS — I10 Essential (primary) hypertension: Secondary | ICD-10-CM

## 2016-12-19 DIAGNOSIS — M79672 Pain in left foot: Secondary | ICD-10-CM

## 2016-12-19 DIAGNOSIS — Z9189 Other specified personal risk factors, not elsewhere classified: Secondary | ICD-10-CM | POA: Diagnosis not present

## 2016-12-19 DIAGNOSIS — Z1159 Encounter for screening for other viral diseases: Secondary | ICD-10-CM

## 2016-12-19 DIAGNOSIS — G8929 Other chronic pain: Secondary | ICD-10-CM

## 2016-12-19 DIAGNOSIS — R739 Hyperglycemia, unspecified: Secondary | ICD-10-CM | POA: Diagnosis not present

## 2016-12-19 DIAGNOSIS — M79671 Pain in right foot: Secondary | ICD-10-CM | POA: Diagnosis not present

## 2016-12-19 LAB — CBC
HEMATOCRIT: 43 % (ref 36.0–46.0)
Hemoglobin: 14.3 g/dL (ref 12.0–15.0)
MCHC: 33.2 g/dL (ref 30.0–36.0)
MCV: 85 fl (ref 78.0–100.0)
Platelets: 233 10*3/uL (ref 150.0–400.0)
RBC: 5.05 Mil/uL (ref 3.87–5.11)
RDW: 13.9 % (ref 11.5–15.5)
WBC: 6.1 10*3/uL (ref 4.0–10.5)

## 2016-12-19 LAB — BASIC METABOLIC PANEL
BUN: 17 mg/dL (ref 6–23)
CHLORIDE: 101 meq/L (ref 96–112)
CO2: 31 meq/L (ref 19–32)
CREATININE: 0.76 mg/dL (ref 0.40–1.20)
Calcium: 9.3 mg/dL (ref 8.4–10.5)
GFR: 79.21 mL/min (ref >60.00)
GLUCOSE: 122 mg/dL — AB (ref 70–99)
Potassium: 4.4 mEq/L (ref 3.5–5.1)
Sodium: 140 mEq/L (ref 135–145)

## 2016-12-19 LAB — VITAMIN B12: Vitamin B-12: 259 pg/mL (ref 211–911)

## 2016-12-19 LAB — HEMOGLOBIN A1C: HEMOGLOBIN A1C: 6.3 % (ref 4.6–6.5)

## 2016-12-19 NOTE — Patient Instructions (Signed)
BEFORE YOU LEAVE: -Labs  -follow up: Annual wellness visit with Manuela Schwartz and CPE with Dr. Maudie Mercury in March, same day  Do the back exercises at least 6 days/week and with please try to get regular aerobic exercise as well.  Please call us if this back pain worsens or does not resolve with the exercises.  Please follow-up with your podiatrist regarding the foot pain.  We have ordered labs or studies at this visit. It can take up to 1-2 weeks for results and processing. IF results require follow up or explanation, we will call you with instructions. Clinically stable results will be released to your [facility]. If you have not heard from [place] or cannot find your results in [facility] in 2 weeks please contact our office at [phone].  If you are not yet signed up for [facility], please consider signing up.   We recommend the following healthy lifestyle for LIFE: 1) Small portions. But, make sure to get regular (at least 3 per day), healthy meals and small healthy snacks if needed.  2) Eat a healthy clean diet.   TRY TO EAT: -at least 5-7 servings of low sugar, colorful, and nutrient rich vegetables per day (not corn, potatoes or bananas.) -berries are the best choice if you wish to eat fruit (only eat small amounts if trying to reduce weight)  -lean meets (fish, white meat of chicken or [place]) -vegan proteins for some meals - beans or tofu, whole grains, nuts and seeds -Replace bad fats with good fats - good fats include: fish, nuts and seeds, canola oil, olive oil -small amounts of low fat or non fat dairy -small amounts of100 % whole grains - check the lables -drink plenty of water  AVOID: -SUGAR, sweets, anything with added sugar, corn syrup or sweeteners - must read labels as even foods advertised as "healthy" often are loaded with sugar -if you must have a sweetener, small amounts of stevia may be best -sweetened beverages and artificially sweetened beverages -simple starches (rice, bread,  potatoes, pasta, chips, etc - small amounts of 100% whole grains are ok) -red meat, pork, butter -fried foods, fast food, processed food, excessive dairy, eggs and coconut.  3)Get at least 150 minutes of sweaty aerobic exercise per week.  4)Reduce stress - consider counseling, meditation and relaxation to balance other aspects of your life.

## 2016-12-19 NOTE — Addendum Note (Signed)
Addended by: Lucretia Kern on: 12/19/2016 09:22 AM   Modules accepted: Orders

## 2016-12-20 LAB — HEPATITIS C ANTIBODY
Hepatitis C Ab: NONREACTIVE
SIGNAL TO CUT-OFF: 0.01 (ref <1.00)

## 2016-12-30 ENCOUNTER — Other Ambulatory Visit: Payer: Self-pay | Admitting: Family Medicine

## 2017-01-01 ENCOUNTER — Other Ambulatory Visit: Payer: Self-pay | Admitting: Family Medicine

## 2017-01-01 ENCOUNTER — Encounter: Payer: Self-pay | Admitting: Family Medicine

## 2017-01-25 ENCOUNTER — Encounter: Payer: Self-pay | Admitting: Family Medicine

## 2017-01-29 ENCOUNTER — Encounter: Payer: Self-pay | Admitting: Family Medicine

## 2017-01-30 ENCOUNTER — Other Ambulatory Visit: Payer: Self-pay | Admitting: *Deleted

## 2017-01-30 MED ORDER — HYOSCYAMINE SULFATE 0.125 MG SL SUBL: 0.2500 mg | SUBLINGUAL_TABLET | SUBLINGUAL | 0 refills | Status: DC | PRN

## 2017-01-30 MED ORDER — CYANOCOBALAMIN 1000 MCG/ML IJ SOLN: INTRAMUSCULAR | 3 refills | Status: DC

## 2017-01-30 NOTE — Telephone Encounter (Signed)
Rx done. 

## 2017-02-06 ENCOUNTER — Other Ambulatory Visit: Payer: Self-pay | Admitting: *Deleted

## 2017-02-06 ENCOUNTER — Ambulatory Visit (INDEPENDENT_AMBULATORY_CARE_PROVIDER_SITE_OTHER): Payer: Medicare HMO | Admitting: Family Medicine

## 2017-02-06 ENCOUNTER — Encounter: Payer: Self-pay | Admitting: Family Medicine

## 2017-02-06 VITALS — BP 100/50 | HR 82 | Temp 98.1°F | Ht 63.0 in | Wt 179.4 lb

## 2017-02-06 DIAGNOSIS — J209 Acute bronchitis, unspecified: Secondary | ICD-10-CM

## 2017-02-06 DIAGNOSIS — J440 Chronic obstructive pulmonary disease with acute lower respiratory infection: Secondary | ICD-10-CM | POA: Diagnosis not present

## 2017-02-06 MED ORDER — HYOSCYAMINE SULFATE 0.125 MG SL SUBL: 0.2500 mg | SUBLINGUAL_TABLET | SUBLINGUAL | 0 refills | Status: DC | PRN

## 2017-02-06 MED ORDER — PREDNISONE 20 MG PO TABS: 40.0000 mg | ORAL_TABLET | Freq: Every day | ORAL | 0 refills | Status: DC

## 2017-02-06 MED ORDER — AZITHROMYCIN 250 MG PO TABS: ORAL_TABLET | ORAL | 0 refills | Status: DC

## 2017-02-06 NOTE — Telephone Encounter (Signed)
Rx done. 

## 2017-02-06 NOTE — Progress Notes (Signed)
HPI:  Acute visit for respiratory illness: -started:last week about 5-6 days ago -symptoms:nasal congestion, sore throat, cough, wheezing, increased thick sputum productions -denies:fever, SOB, NVD, tooth pain, body aches -has tried: albuterol -sick contacts/travel/risks: no reported flu, strep or tick exposure -Hx BP:ZWCH ROS: See pertinent positives and negatives per HPI.  Past Medical History:  Diagnosis Date  . Cataract   . Colon polyps    history of  . COLONIC POLYPS, HX OF 08/09/2006   Qualifier: Diagnosis of  By: Scherrie Gerlach    . DECREASED HEARING 11/25/2008   Qualifier: Diagnosis of  By: Scherrie Gerlach    . Elevated LFTs    obstructive disease  . HIATAL HERNIA 02/05/2009   Qualifier: Diagnosis of  By: Harriet Pho    . Hyperglycemia 11/10/2013  . Hyperlipidemia   . Hypertension   . Osteoporosis    was osteopenia-last bone scan all clear   . Pernicious anemia   . Sleep apnea 01/14/2016    Past Surgical History:  Procedure Laterality Date  . [place] RELEASE Left 2016  . cataract surgery  2011   Bilateral  . COLONOSCOPY    . HAND TENDON SURGERY  01/16/2014   Dr Burney Gauze  . OVARIAN CYST SURGERY  1968  . RETINAL DETACHMENT SURGERY  2011   left  . TOENAIL EXCISION Right    Dr Patrick Jupiter great toenail excision  . TRAPEZIUM RESECTION Left 2016   left wrist   . TUBAL LIGATION  1979    Family History  Problem Relation Age of Onset  . Pernicious anemia Mother   . Cancer Father        lung/ bone  . Osteoporosis Father   . Hypothyroidism Father   . Colon cancer Sister   . Cancer - Colon Sister   . Colon polyps Neg Hx   . Rectal cancer Neg Hx   . Stomach cancer Neg Hx     Social History   Socioeconomic History  . Marital status: Married    Spouse name: None  . Number of children: None  . Years of education: None  . Highest education level: None  Social Needs  . Financial resource strain: None  .  Food insecurity - worry: None  . Food insecurity - inability: None  . Transportation needs - medical: None  . Transportation needs - non-medical: None  Occupational History  . None  Tobacco Use  . Smoking status: Former Smoker    Last attempt to quit: 01/17/2000    Years since quitting: 17.0  . Smokeless tobacco: Never Used  . Tobacco comment: smoking hx unknown at this time; started in college   Substance and Sexual Activity  . Alcohol use: Yes    Alcohol/week: 1.2 - 1.8 oz    Types: 2 - 3 Standard drinks or equivalent per week    Comment: 3 times a week   . Drug use: No  . Sexual activity: None  Other Topics Concern  . 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  albuterol (PROAIR HFA) 108 (90 Base) MCG/ACT inhaler, 2 puffs every 4 hours as needed only  if your can't catch your breath, Disp: 1 Inhaler, Rfl: 11 .  aspirin 81 MG chewable tablet, 1/2 tablet every other day, Disp: , Rfl:  .  cholecalciferol (VITAMIN D) 1000 units tablet, Take 1,000 Units by mouth daily., Disp: , Rfl:  .  cyanocobalamin (,VITAMIN B-12,) 1000 MCG/ML injection, INJECT 1ML INTO THE MUSCLE EVERY 30 DAYS, Disp: 12 mL, Rfl: 3 .  hyoscyamine (LEVSIN SL) 0.125 MG SL tablet, Place 2 tablets (0.25 mg total) under the tongue every 4 (four) hours as needed for cramping., Disp: 30 tablet, Rfl: 0 .  KLOR-CON M20 20 MEQ tablet, TAKE 1 TABLET BY MOUTH EVERY DAY, Disp: 90 tablet, Rfl: 1 .  rosuvastatin (CRESTOR) 20 MG tablet, TAKE 1 TABLET BY MOUTH EVERY DAY, Disp: 90 tablet, Rfl: 1 .  valsartan-hydrochlorothiazide (DIOVAN-HCT) 320-25 MG tablet, TAKE 1 TABLET BY MOUTH DAILY., Disp: 90 tablet, Rfl: 1 .  azithromycin (ZITHROMAX) 250 MG tablet, 2 tabs  day 1, then one tab daily, Disp: 6 tablet, Rfl: 0 .  predniSONE (DELTASONE) 20 MG tablet, Take 2 tablets (40 mg total) by mouth daily with breakfast., Disp: 8 tablet, Rfl: 0  EXAM:  Vitals:   02/06/17 1253  BP: (!) 100/50  Pulse: 82  Temp: 98.1 F (36.7 C)  SpO2: 95%    Body mass index is 31.78 kg/m.  GENERAL: vitals reviewed and listed above, alert, oriented, appears well hydrated and in no acute distress  HEENT: atraumatic, conjunttiva clear, no obvious abnormalities on inspection of external nose and ears, normal appearance of ear canals and TMs, clear nasal congestion, mild post oropharyngeal erythema with PND, no tonsillar edema or exudate, no sinus TTP  NECK: no obvious masses on inspection  LUNGS: Scattered expiratory wheeze  CV: HRRR, no peripheral edema  MS: moves all extremities without noticeable abnormality  PSYCH: pleasant and cooperative, no obvious depression or anxiety  ASSESSMENT AND PLAN:  Discussed the following assessment and plan:  Acute bronchitis with COPD ([place])   -Discussed potential etiologies, treatment options and risk -She wants to try prednisone and a Z-Pak, prescription sent -of course, we advised to return or notify a doctor immediately if symptoms worsen or persist or new concerns arise.    There are no Patient Instructions on file for this visit.  Lucretia Kern, DO

## 2017-02-20 ENCOUNTER — Encounter: Payer: Self-pay | Admitting: Family Medicine

## 2017-02-20 ENCOUNTER — Other Ambulatory Visit: Payer: Self-pay | Admitting: *Deleted

## 2017-02-20 MED ORDER — ROSUVASTATIN CALCIUM 20 MG PO TABS: 20.0000 mg | ORAL_TABLET | Freq: Every day | ORAL | 1 refills | Status: DC

## 2017-02-20 NOTE — Telephone Encounter (Signed)
Rx done. 

## 2017-02-21 ENCOUNTER — Other Ambulatory Visit: Payer: Self-pay | Admitting: Family Medicine

## 2017-02-22 ENCOUNTER — Other Ambulatory Visit: Payer: Self-pay | Admitting: *Deleted

## 2017-02-22 MED ORDER — HYDROCHLOROTHIAZIDE 25 MG PO TABS: 25.0000 mg | ORAL_TABLET | Freq: Every day | ORAL | 0 refills | Status: DC

## 2017-02-22 NOTE — Telephone Encounter (Signed)
Rx done per Dr Candis Schatz Mychart note.

## 2017-02-23 ENCOUNTER — Encounter: Payer: Self-pay | Admitting: Family Medicine

## 2017-03-01 ENCOUNTER — Encounter: Payer: Self-pay | Admitting: Family Medicine

## 2017-03-01 ENCOUNTER — Ambulatory Visit (INDEPENDENT_AMBULATORY_CARE_PROVIDER_SITE_OTHER): Payer: Medicare HMO | Admitting: Family Medicine

## 2017-03-01 VITALS — BP 132/82 | HR 63 | Temp 97.5°F | Ht 63.0 in | Wt 181.0 lb

## 2017-03-01 DIAGNOSIS — I10 Essential (primary) hypertension: Secondary | ICD-10-CM

## 2017-03-01 MED ORDER — TELMISARTAN 40 MG PO TABS: 40.0000 mg | ORAL_TABLET | Freq: Every day | ORAL | 1 refills | Status: DC

## 2017-03-01 NOTE — Patient Instructions (Signed)
BEFORE YOU LEAVE: -follow up: 3 months  We have decided to replace her combination blood pressure medicine with 2 medications: 1) the hydrochlorothiazide that we sent in before 2) the Telmisartan (micardis)   Please take both of these medicines once daily in the morning.

## 2017-03-01 NOTE — Progress Notes (Signed)
HPI:  Visit for hypertension medications: -She talk to her pharmacist about recent medication recalls and wanted to stop her combination of hydrochlorothiazide and valsartan -Current medications include hydrochlorothiazide alone -Her blood pressure was a little low at her last check -Reports: Spoke with her pharmacist and she is anxious about all of the recalls of valsartan.  Her pharmacist recommended myocarditis.  She reports she took this in the past and tolerated it well.  She wants to change to this. -Denies: Chest pain, shortness of breath, swelling -There is a listed intolerance to lisinopril, however she tolerated valsartan well  ROS: See pertinent positives and negatives per HPI.  Past Medical History:  Diagnosis Date  . Cataract   . Colon polyps    history of  . COLONIC POLYPS, HX OF 08/09/2006   Qualifier: Diagnosis of  By: Scherrie Gerlach    . DECREASED HEARING 11/25/2008   Qualifier: Diagnosis of  By: Scherrie Gerlach    . Elevated LFTs    obstructive disease  . HIATAL HERNIA 02/05/2009   Qualifier: Diagnosis of  By: Harriet Pho    . Hyperglycemia 11/10/2013  . Hyperlipidemia   . Hypertension   . Osteoporosis    was osteopenia-last bone scan all clear   . Pernicious anemia   . Sleep apnea 01/14/2016    Past Surgical History:  Procedure Laterality Date  . [place] RELEASE Left 2016  . cataract surgery  2011   Bilateral  . COLONOSCOPY    . HAND TENDON SURGERY  01/16/2014   Dr Burney Gauze  . OVARIAN CYST SURGERY  1968  . RETINAL DETACHMENT SURGERY  2011   left  . TOENAIL EXCISION Right    Dr Patrick Jupiter great toenail excision  . TRAPEZIUM RESECTION Left 2016   left wrist   . TUBAL LIGATION  1979    Family History  Problem Relation Age of Onset  . Pernicious anemia Mother   . Cancer Father        lung/ bone  . Osteoporosis Father   . Hypothyroidism Father   . Colon cancer Sister   . Cancer - Colon Sister    . Colon polyps Neg Hx   . Rectal cancer Neg Hx   . Stomach cancer Neg Hx     Social History   Socioeconomic History  . Marital status: Married    Spouse name: None  . Number of children: None  . Years of education: None  . Highest education level: None  Social Needs  . Financial resource strain: None  . Food insecurity - worry: None  . Food insecurity - inability: None  . Transportation needs - medical: None  . Transportation needs - non-medical: None  Occupational History  . None  Tobacco Use  . Smoking status: Former Smoker    Last attempt to quit: 01/17/2000    Years since quitting: 17.1  . Smokeless tobacco: Never Used  . Tobacco comment: smoking hx unknown at this time; started in college   Substance and Sexual Activity  . Alcohol use: Yes    Alcohol/week: 1.2 - 1.8 oz    Types: 2 - 3 Standard drinks or equivalent per week    Comment: 3 times a week   . Drug use: No  . Sexual activity: None  Other Topics Concern  . 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  albuterol (PROAIR HFA) 108 (90 Base) MCG/ACT inhaler, 2 puffs every 4 hours as needed only  if your can't catch your breath, Disp: 1 Inhaler, Rfl: 11 .  aspirin 81 MG chewable tablet, 1/2 tablet every other day, Disp: , Rfl:  .  azithromycin (ZITHROMAX) 250 MG tablet, 2 tabs day 1, then one tab daily, Disp: 6 tablet, Rfl: 0 .  cholecalciferol (VITAMIN D) 1000 units tablet, Take 1,000 Units by mouth daily., Disp: , Rfl:  .  cyanocobalamin (,VITAMIN B-12,) 1000 MCG/ML injection, INJECT 1ML INTO THE MUSCLE EVERY 30 DAYS, Disp: 12 mL, Rfl: 3 .  hydrochlorothiazide (HYDRODIURIL) 25 MG tablet, Take 1 tablet (25 mg total) by mouth daily., Disp: 90  tablet, Rfl: 0 .  hyoscyamine (LEVSIN SL) 0.125 MG SL tablet, Place 2 tablets (0.25 mg total) under the tongue every 4 (four) hours as needed for cramping., Disp: 90 tablet, Rfl: 0 .  KLOR-CON M20 20 MEQ tablet, TAKE 1 TABLET BY MOUTH EVERY DAY, Disp: 90 tablet, Rfl: 1 .  predniSONE (DELTASONE) 20 MG tablet, Take 2 tablets (40 mg total) by mouth daily with breakfast., Disp: 8 tablet, Rfl: 0 .  rosuvastatin (CRESTOR) 20 MG tablet, Take 1 tablet (20 mg total) by mouth daily., Disp: 90 tablet, Rfl: 1 .  telmisartan (MICARDIS) 40 MG tablet, Take 1 tablet (40 mg total) by mouth daily., Disp: 90 tablet, Rfl: 1  EXAM:  Vitals:   03/01/17 1342  BP: 132/82  Pulse: 63  Temp: (!) 97.5 F (36.4 C)    Body mass index is 32.06 kg/m.  GENERAL: vitals reviewed and listed above, alert, oriented, appears well hydrated and in no acute distress  HEENT: atraumatic, conjunttiva clear, no obvious abnormalities on inspection of external nose and ears  NECK: no obvious masses on inspection  LUNGS: clear to auscultation bilaterally, no wheezes, rales or rhonchi, good air movement  CV: HRRR, no peripheral edema  MS: moves all extremities without noticeable abnormality  PSYCH: pleasant and cooperative, no obvious depression or anxiety  ASSESSMENT AND PLAN:  Discussed the following assessment and plan:  Essential hypertension  -Discussed various options -She is currently taking a valsartan that her pharmacy said was not recalled, however her pharmacy recommended that she change to my card is due to how many companies have recalled the valsartan -Side to make this change -New prescription sent -Monitor blood pressure at home and advised her to follow-up in 3 months -Patient advised to return or notify a doctor immediately if symptoms worsen or persist or new concerns arise.  Patient Instructions  BEFORE YOU LEAVE: -follow up: 3 months  We have decided to replace her combination blood pressure  medicine with 2 medications: 1) the hydrochlorothiazide that we sent in before 2) the Telmisartan (micardis)   Please take both of these medicines once daily in the morning.    Cassandra Kern, DO

## 2017-03-10 DIAGNOSIS — J441 Chronic obstructive pulmonary disease with (acute) exacerbation: Secondary | ICD-10-CM | POA: Diagnosis not present

## 2017-03-11 ENCOUNTER — Encounter: Payer: Self-pay | Admitting: Family Medicine

## 2017-03-12 ENCOUNTER — Ambulatory Visit (INDEPENDENT_AMBULATORY_CARE_PROVIDER_SITE_OTHER): Payer: Medicare HMO | Admitting: Family Medicine

## 2017-03-12 ENCOUNTER — Encounter: Payer: Self-pay | Admitting: Family Medicine

## 2017-03-12 ENCOUNTER — Ambulatory Visit: Payer: Self-pay | Admitting: *Deleted

## 2017-03-12 ENCOUNTER — Ambulatory Visit (INDEPENDENT_AMBULATORY_CARE_PROVIDER_SITE_OTHER)
Admission: RE | Admit: 2017-03-12 | Discharge: 2017-03-12 | Disposition: A | Discharge status: Home / Self Care | Payer: Medicare HMO | Source: Ambulatory Visit | Attending: Family Medicine | Admitting: Family Medicine

## 2017-03-12 VITALS — BP 140/60 | HR 72 | Temp 98.4°F | Ht 63.0 in | Wt 176.8 lb

## 2017-03-12 DIAGNOSIS — R05 Cough: Secondary | ICD-10-CM

## 2017-03-12 DIAGNOSIS — J209 Acute bronchitis, unspecified: Secondary | ICD-10-CM

## 2017-03-12 DIAGNOSIS — R197 Diarrhea, unspecified: Secondary | ICD-10-CM | POA: Diagnosis not present

## 2017-03-12 DIAGNOSIS — J440 Chronic obstructive pulmonary disease with acute lower respiratory infection: Secondary | ICD-10-CM | POA: Diagnosis not present

## 2017-03-12 DIAGNOSIS — J989 Respiratory disorder, unspecified: Secondary | ICD-10-CM | POA: Diagnosis not present

## 2017-03-12 DIAGNOSIS — R059 Cough, unspecified: Secondary | ICD-10-CM

## 2017-03-12 LAB — POC INFLUENZA A&B (BINAX/QUICKVUE)
INFLUENZA B, POC: NEGATIVE
Influenza A, POC: NEGATIVE

## 2017-03-12 MED ORDER — ALBUTEROL SULFATE (2.5 MG/3ML) 0.083% IN NEBU
2.5000 mg | INHALATION_SOLUTION | Freq: Once | RESPIRATORY_TRACT | Status: AC
  Administered 2017-03-12: 2.5 mg via RESPIRATORY_TRACT

## 2017-03-12 NOTE — Patient Instructions (Signed)
BEFORE YOU LEAVE: -neb treatment -flu test -xray sheet -lab for c .diff test  Go get the xray.  Continue prednisone 40mg  daily.  Use you inhalers as instructed.  Call you pulmonologist or seek emergency care if worsening or not improving over the next 1 day. Seek emergency care if struggling to breath despite treatment.  We have ordered labs or studies at this visit. It can take up to 1-2 weeks for results and processing. IF results require follow up or explanation, we will call you with instructions. Clinically stable results will be released to your [facility]. If you have not heard from [place] or cannot find your results in [facility] in 2 weeks please contact our office at [phone].  If you are not yet signed up for [facility], please consider signing up.

## 2017-03-12 NOTE — Addendum Note (Signed)
Addended by: Agnes Lawrence on: 03/12/2017 11:23 AM   Modules accepted: Orders

## 2017-03-12 NOTE — Telephone Encounter (Signed)
Pt  Has    An   Appointment       At  53 this   Am    Called  The  Patient to  Make  Sure  She  Is  Doing  [place]    Her  husband  Will take  Her  To the  Appointment   And  At this   Time  She  Will   Follow  The  Plan of  Care

## 2017-03-12 NOTE — Addendum Note (Signed)
Addended by: Dorrene German on: 03/12/2017 12:03 PM   Modules accepted: Orders

## 2017-03-12 NOTE — Telephone Encounter (Signed)
Saw pt. Ordered c diff.

## 2017-03-12 NOTE — Progress Notes (Signed)
HPI:  Acute visit for several concerns.  First of all she got sick about 5 days ago with upper respiratory symptoms, cough, wheezing and increased albuterol use.  She went to the urgent care clinic and was treated with a Z-Pak and prednisone 40 mg daily which she started the day before yesterday.  She is doing a little better, but still has some wheezing, and a cough that is productive of white sputum, fatigue and mild shortness of breath.  She has been using her albuterol several times daily.  She plans to call her pulmonologist today.  She denies significant fevers or body aches, but did not check her temperature initially.  She also has had some diarrhea on and off for the last 3 weeks.  She is very worried about C. difficile infection as she took an antibiotic last month.  She has had 3-5 episodes of loose bowels daily.  She denies weight loss, fevers, nausea, vomiting, melena, hematochezia.  ROS: See pertinent positives and negatives per HPI.  Past Medical History:  Diagnosis Date  . Cataract   . Colon polyps    history of  . COLONIC POLYPS, HX OF 08/09/2006   Qualifier: Diagnosis of  By: Scherrie Gerlach    . DECREASED HEARING 11/25/2008   Qualifier: Diagnosis of  By: Scherrie Gerlach    . Elevated LFTs    obstructive disease  . HIATAL HERNIA 02/05/2009   Qualifier: Diagnosis of  By: Harriet Pho    . Hyperglycemia 11/10/2013  . Hyperlipidemia   . Hypertension   . Osteoporosis    was osteopenia-last bone scan all clear   . Pernicious anemia   . Sleep apnea 01/14/2016    Past Surgical History:  Procedure Laterality Date  . [place] RELEASE Left 2016  . cataract surgery  2011   Bilateral  . COLONOSCOPY    . HAND TENDON SURGERY  01/16/2014   Dr Burney Gauze  . OVARIAN CYST SURGERY  1968  . RETINAL DETACHMENT SURGERY  2011   left  . TOENAIL EXCISION Right    Dr Patrick Jupiter great toenail excision  . TRAPEZIUM RESECTION Left 2016   left  wrist   . TUBAL LIGATION  1979    Family History  Problem Relation Age of Onset  . Pernicious anemia Mother   . Cancer Father        lung/ bone  . Osteoporosis Father   . Hypothyroidism Father   . Colon cancer Sister   . Cancer - Colon Sister   . Colon polyps Neg Hx   . Rectal cancer Neg Hx   . Stomach cancer Neg Hx     Social History   Socioeconomic History  . Marital status: Married    Spouse name: None  . Number of children: None  . Years of education: None  . Highest education level: None  Social Needs  . Financial resource strain: None  . Food insecurity - worry: None  . Food insecurity - inability: None  . Transportation needs - medical: None  . Transportation needs - non-medical: None  Occupational History  . None  Tobacco Use  . Smoking status: Former Smoker    Last attempt to quit: 01/17/2000    Years since quitting: 17.1  . Smokeless tobacco: Never Used  . Tobacco comment: smoking hx unknown at this time; started in college   Substance and Sexual Activity  . Alcohol use: Yes  Alcohol/week: 1.2 - 1.8 oz    Types: 2 - 3 Standard drinks or equivalent per week    Comment: 3 times a week   . Drug use: No  . Sexual activity: None  Other Topics Concern  . 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  albuterol (PROAIR HFA) 108 (90 Base) MCG/ACT inhaler, 2 puffs every 4 hours as needed only  if your can't catch your breath, Disp: 1 Inhaler, Rfl: 11 .  aspirin 81 MG chewable tablet, 1/2 tablet every other day, Disp: , Rfl:  .  azithromycin (ZITHROMAX) 250 MG tablet, 2 tabs day 1, then one tab daily, Disp: 6 tablet, Rfl: 0 .  cholecalciferol (VITAMIN D) 1000 units tablet, Take 1,000 Units  by mouth daily., Disp: , Rfl:  .  cyanocobalamin (,VITAMIN B-12,) 1000 MCG/ML injection, INJECT 1ML INTO THE MUSCLE EVERY 30 DAYS, Disp: 12 mL, Rfl: 3 .  hydrochlorothiazide (HYDRODIURIL) 25 MG tablet, Take 1 tablet (25 mg total) by mouth daily., Disp: 90 tablet, Rfl: 0 .  hyoscyamine (LEVSIN SL) 0.125 MG SL tablet, Place 2 tablets (0.25 mg total) under the tongue every 4 (four) hours as needed for cramping., Disp: 90 tablet, Rfl: 0 .  KLOR-CON M20 20 MEQ tablet, TAKE 1 TABLET BY MOUTH EVERY DAY, Disp: 90 tablet, Rfl: 1 .  predniSONE (DELTASONE) 20 MG tablet, Take 2 tablets (40 mg total) by mouth daily with breakfast., Disp: 8 tablet, Rfl: 0 .  rosuvastatin (CRESTOR) 20 MG tablet, Take 1 tablet (20 mg total) by mouth daily., Disp: 90 tablet, Rfl: 1 .  telmisartan (MICARDIS) 40 MG tablet, Take 1 tablet (40 mg total) by mouth daily., Disp: 90 tablet, Rfl: 1  EXAM:  Vitals:   03/12/17 1042  BP: 140/60  Pulse: 72  Temp: 98.4 F (36.9 C)  SpO2: 92%    Body mass index is 31.32 kg/m.  GENERAL: vitals reviewed and listed above, alert, oriented, appears well hydrated and in no acute distress  HEENT: atraumatic, conjunttiva clear, no obvious abnormalities on inspection of external nose and ears  NECK: no obvious masses on inspection  LUNGS: No signs of respiratory distress, she does have scattered expiratory wheezing, normal respiratory rate  CV: HRRR, no peripheral edema  MS: moves all extremities without noticeable abnormality  PSYCH: pleasant and cooperative, no obvious depression or anxiety  ASSESSMENT AND PLAN:  Discussed the following assessment and plan:  Diarrhea, unspecified type - Plan: Clostridium Difficile by PCR  Cough  Acute bronchitis with COPD ([place])  Respiratory illness  -For the diarrhea, we will check a C. difficile and also advised a dairy free diet for at least 1 week, Imodium as needed and good oral hydration -For the bronchitis with respiratory infection,  we will check a flu swab and also a chest x-ray -changevantibiotics if lower respiratory infection, continue course of prednisone and as needed albuterol -breathing treatment in clinic today helped her symptoms , this is her second bout with respiratory illness in the last month and a half, she plans to call her pulmonologist today and also see her pulmonologist or  seek emergency, difficulty breathing or not turning the corner with treatment over the next 24 hours -Patient advised to return or notify a doctor immediately if symptoms worsen or persist or new concerns arise.  Patient Instructions  BEFORE YOU LEAVE: -neb treatment -flu test -xray sheet -lab for c .diff test  Go get the xray.  Continue prednisone 40mg  daily.  Use you inhalers as instructed.  Call you pulmonologist or seek emergency care if worsening or not improving over the next 1 day. Seek emergency care if struggling to breath despite treatment.  We have ordered labs or studies at this visit. It can take up to 1-2 weeks for results and processing. IF results require follow up or explanation, we will call you with instructions. Clinically stable results will be released to your [facility]. If you have not heard from [place] or cannot find your results in [facility] in 2 weeks please contact our office at [phone].  If you are not yet signed up for [facility], please consider signing up.             Lucretia Kern, DO

## 2017-03-12 NOTE — Addendum Note (Signed)
Addended by: Lucretia Kern on: 03/12/2017 11:41 AM   Modules accepted: Orders

## 2017-03-13 ENCOUNTER — Other Ambulatory Visit: Payer: Self-pay | Admitting: *Deleted

## 2017-03-13 MED ORDER — DOXYCYCLINE HYCLATE 100 MG PO TABS: 100.0000 mg | ORAL_TABLET | Freq: Two times a day (BID) | ORAL | 0 refills | Status: DC

## 2017-03-16 ENCOUNTER — Telehealth: Payer: Self-pay

## 2017-03-16 NOTE — Telephone Encounter (Signed)
Call to fup AWV Cassandra James states she was very busy and can call her back in a month to schedule

## 2017-03-26 NOTE — Progress Notes (Signed)
HPI:  Here for follow up - she is going to reschedule her AWV with Manuela Schwartz as does not have time for this today.  Using dictation device. Unfortunately this device frequently misinterprets words/phrases. Due for mammogram and labs.  PMH HTN, COPD, Obesity, hyperglycemia, hyperlipidemia, b12 deficiency, osteoporosis, knee pain and IBS.  Thankfully her diarrhea resolved.  Also her respiratory symptoms have mostly resolved since her last visit.  She has occasional drainage in the throat, but otherwise is feeling well.  Her only other symptom recently is that of stiffness in the left ankle when she gets up in the morning, this resolves after getting up and walking around.  She is not having pain, redness or noticeable swelling. She is not getting regular exercise right now, but hopes to start.  Reports her diet is fairly good.  She is taking all of her medications as prescribed and does not have any chest pain, trouble breathing, increased swelling or other cardiovascular symptoms.  She currently has to split her cholesterol medication and would like [place] to send a different prescription so that she can take 1 tablet daily.  Reports her mammogram is set up.  Past Medical History:  Diagnosis Date  . Cataract   . Colon polyps    history of  . COLONIC POLYPS, HX OF 08/09/2006   Qualifier: Diagnosis of  By: Scherrie Gerlach    . DECREASED HEARING 11/25/2008   Qualifier: Diagnosis of  By: Scherrie Gerlach    . Elevated LFTs    obstructive disease  . HIATAL HERNIA 02/05/2009   Qualifier: Diagnosis of  By: Harriet Pho    . Hyperglycemia 11/10/2013  . Hyperlipidemia   . Hypertension   . Osteoporosis    was osteopenia-last bone scan all clear   . Pernicious anemia   . Sleep apnea 01/14/2016    Past Surgical History:  Procedure Laterality Date  . [place] RELEASE Left 2016  . cataract surgery  2011   Bilateral  . COLONOSCOPY    . HAND TENDON SURGERY   01/16/2014   Dr Burney Gauze  . OVARIAN CYST SURGERY  1968  . RETINAL DETACHMENT SURGERY  2011   left  . TOENAIL EXCISION Right    Dr Patrick Jupiter great toenail excision  . TRAPEZIUM RESECTION Left 2016   left wrist   . TUBAL LIGATION  1979    Family History  Problem Relation Age of Onset  . Pernicious anemia Mother   . Cancer Father        lung/ bone  . Osteoporosis Father   . Hypothyroidism Father   . Colon cancer Sister   . Cancer - Colon Sister   . Colon polyps Neg Hx   . Rectal cancer Neg Hx   . Stomach cancer Neg Hx     Social History   Socioeconomic History  . Marital status: Married    Spouse name: None  . Number of children: None  . Years of education: None  . Highest education level: None  Social Needs  . Financial resource strain: None  . Food insecurity - worry: None  . Food insecurity - inability: None  . Transportation needs - medical: None  . Transportation needs - non-medical: None  Occupational History  . None  Tobacco Use  . Smoking status: Former Smoker    Last attempt to quit: 01/17/2000    Years since quitting: 17.2  . Smokeless tobacco: Never Used  .  Tobacco comment: smoking hx unknown at this time; started in college   Substance and Sexual Activity  . Alcohol use: Yes    Alcohol/week: 1.2 - 1.8 oz    Types: 2 - 3 Standard drinks or equivalent per week    Comment: 3 times a week   . Drug use: No  . Sexual activity: None  Other Topics Concern  . None  Social History Narrative   Work or School: Freight forwarder Situation: lives with husband - he sees me as well      Spiritual Beliefs: none      Lifestyle: no regular exercise; diet is good              Current Outpatient Medications:  .  albuterol (PROAIR HFA) 108 (90 Base) MCG/ACT inhaler, 2 puffs every 4 hours as needed only  if your can't catch your breath, Disp: 1 Inhaler, Rfl: 11 .  aspirin 81 MG chewable tablet, 1/2 tablet every other day, Disp: , Rfl:  .  cholecalciferol (VITAMIN  D) 1000 units tablet, Take 1,000 Units by mouth daily., Disp: , Rfl:  .  cyanocobalamin (,VITAMIN B-12,) 1000 MCG/ML injection, INJECT 1ML INTO THE MUSCLE EVERY 30 DAYS, Disp: 12 mL, Rfl: 3 .  hydrochlorothiazide (HYDRODIURIL) 25 MG tablet, Take 1 tablet (25 mg total) by mouth daily., Disp: 90 tablet, Rfl: 0 .  hyoscyamine (LEVSIN SL) 0.125 MG SL tablet, Place 2 tablets (0.25 mg total) under the tongue every 4 (four) hours as needed for cramping., Disp: 90 tablet, Rfl: 0 .  KLOR-CON M20 20 MEQ tablet, TAKE 1 TABLET BY MOUTH EVERY DAY, Disp: 90 tablet, Rfl: 1 .  rosuvastatin (CRESTOR) 20 MG tablet, Take 1 tablet (20 mg total) by mouth daily., Disp: 90 tablet, Rfl: 1 .  telmisartan (MICARDIS) 40 MG tablet, Take 1 tablet (40 mg total) by mouth daily., Disp: 90 tablet, Rfl: 1  EXAM:  Vitals:   03/27/17 0934  BP: (!) 118/54  Pulse: 67  Resp: 16  Temp: 97.9 F (36.6 C)   Body mass index is 31.19 kg/m.  GENERAL: vitals reviewed and listed below, alert, oriented, appears well hydrated and in no acute distress  HEENT: head atraumatic, PERRLA, normal appearance of eyes, ears, nose and mouth. moist mucus membranes.  NECK: supple, no masses or lymphadenopathy  LUNGS: clear to auscultation bilaterally, no rales, rhonchi or wheeze  CV: HRRR, no peripheral edema or cyanosis, normal pedal pulses  SKIN: no rash or abnormal lesions  MS: normal gait, moves all extremities normally, no appreciable swelling, erythema, deformity of the left ankle, normal range of motion  NEURO: normal gait, speech and thought processing grossly intact, muscle tone grossly intact throughout  PSYCH: normal affect, pleasant and cooperative  ASSESSMENT AND PLAN:  Discussed the following assessment and plan:  1. Essential hypertension -mildly low today, but feels well, monitor - Basic metabolic panel - CBC  2. Screening for depression -negative  3. Hyperglycemia -labs, lifestyle recs  4. Hyperlipidemia,  unspecified hyperlipidemia type -labs, lifestyle recs -rx is already as requested I believe, spoke with pt again and she will check bottle at home and let us know  5. B12 deficiency - Vitamin B12  6. COPD GOLD III -sees Dr. Melvyn Novas -glad she is feeling better  She is to set up AWV with Manuela Schwartz.  Patient Instructions  BEFORE YOU LEAVE: -Labs today, you do not need to be fasting today, please come fasting for your next follow-up  visit with me in about 3-4 months -follow up:  1) AWV with susan, asap - due now 2) cancel follow-up with Dr. Maudie Mercury in May, schedule follow-up in 3-4 months  We have ordered labs or studies at this visit. It can take up to 1-2 weeks for results and processing. IF results require follow up or explanation, we will call you with instructions. Clinically stable results will be released to your [facility]. If you have not heard from [place] or cannot find your results in [facility] in 2 weeks please contact our office at [phone].  If you are not yet signed up for [facility], please consider signing up.   We recommend the following healthy lifestyle for LIFE: 1) Small portions. But, make sure to get regular (at least 3 per day), healthy meals and small healthy snacks if needed.  2) Eat a healthy clean diet.   TRY TO EAT: -at least 5-7 servings of low sugar, colorful, and nutrient rich vegetables per day (not corn, potatoes or bananas.) -berries are the best choice if you wish to eat fruit (only eat small amounts if trying to reduce weight)  -lean meets (fish, white meat of chicken or [place]) -vegan proteins for some meals - beans or tofu, whole grains, nuts and seeds -Replace bad fats with good fats - good fats include: fish, nuts and seeds, canola oil, olive oil -small amounts of low fat or non fat dairy -small amounts of100 % whole grains - check the lables -drink plenty of water  AVOID: -SUGAR, sweets, anything with added sugar, corn syrup or sweeteners - must read labels  as even foods advertised as "healthy" often are loaded with sugar -if you must have a sweetener, small amounts of stevia may be best -sweetened beverages and artificially sweetened beverages -simple starches (rice, bread, potatoes, pasta, chips, etc - small amounts of 100% whole grains are ok) -red meat, pork, butter -fried foods, fast food, processed food, excessive dairy, eggs and coconut.  3)Get at least 150 minutes of sweaty aerobic exercise per week.  4)Reduce stress - consider counseling, meditation and relaxation to balance other aspects of your life.          No Follow-up on file.  Lucretia Kern, DO

## 2017-03-26 NOTE — Progress Notes (Deleted)
Subjective:   Briannon Boggio is a 72 y.o. female who presents for Medicare Annual (Subsequent) preventive examination.  Last AWV 03/20/2016  Had pneumonia last year;  Wears hearing aids Pre -diabetic   Psychosocial; 2 boys and one girl [place]; 3 boys and 2 girls One is autistic  Results for SCHWANDA, ZIMA (MRN [id number]) as of 03/26/2017 20:50  Ref. Range 06/19/2016 09:14 12/19/2016 09:43  Glucose Latest Ref Range: 70 - 99 mg/dL 116 (H) 122 (H)  Hemoglobin A1C Latest Ref Range: 4.6 - 6.5 % 6.5 6.3    Diet Vegetables are fresh; may use frozen periodically Eats out 2 times a week Eat 3 meals a day  Lunch 1/2 sandwich; Breakfast toast  Supper average meal; vegetables \  BMI 32    Exercise Last year had a torn meniscus and spouse had disc surgery and still has discomfort with hip Knee is ok; but is very careful Do plan on getting back to the gym  Likes the Bicycle or treadmill  Pool; does like the water    Hearing Screening Comments: Wears hearing aids; has x 25 years  Vision Screening Comments: Last eye exam; one year ago Next one in two weeks Dr. Camillo Flaming   GYN sees Dr. Matthew Saras Mammogram Dexa; Osteoporosis - Dr. Matthew Saras  Colonoscopy 07/2014; (sister has colon cancer)    Etoh 3 times a week Smoking hx; quit 2002; Over 67 yo   Health Maintenance Due  Topic Date Due  . MAMMOGRAM  02/16/2017    Colonoscopy 07/2014; Diabetic eye exam 03/2016     Objective:     Vitals: There were no vitals taken for this visit.  There is no height or weight on file to calculate BMI.  Advanced Directives 03/20/2016 01/14/2016 01/14/2016 07/15/2014 03/13/2014  Does Patient Have a Medical Advance Directive? Yes Yes No Yes Yes  Type of Advance Directive - [place];Living will - [place];Living will [place];Living will  Does patient want to make changes to medical advance directive? - No - Patient declined - - -    Copy of [place] in Chart? - No - copy requested - - -  Would patient like information on creating a medical advance directive? - No - Patient declined No - Patient declined - -    Tobacco Social History   Tobacco Use  Smoking Status Former Smoker  . Last attempt to quit: 01/17/2000  . Years since quitting: 17.2  Smokeless Tobacco Never Used  Tobacco Comment   smoking hx unknown at this time; started in college      Counseling given: Not Answered Comment: smoking hx unknown at this time; started in college    Clinical Intake:     Past Medical History:  Diagnosis Date  . Cataract   . Colon polyps    history of  . COLONIC POLYPS, HX OF 08/09/2006   Qualifier: Diagnosis of  By: Scherrie Gerlach    . DECREASED HEARING 11/25/2008   Qualifier: Diagnosis of  By: Scherrie Gerlach    . Elevated LFTs    obstructive disease  . HIATAL HERNIA 02/05/2009   Qualifier: Diagnosis of  By: Harriet Pho    . Hyperglycemia 11/10/2013  . Hyperlipidemia   . Hypertension   . Osteoporosis    was osteopenia-last bone scan all clear   . Pernicious anemia   . Sleep apnea 01/14/2016   Past Surgical History:  Procedure Laterality Date  .  [place] RELEASE Left 2016  . cataract surgery  2011   Bilateral  . COLONOSCOPY    . HAND TENDON SURGERY  01/16/2014   Dr Burney Gauze  . OVARIAN CYST SURGERY  1968  . RETINAL DETACHMENT SURGERY  2011   left  . TOENAIL EXCISION Right    Dr Patrick Jupiter great toenail excision  . TRAPEZIUM RESECTION Left 2016   left wrist   . TUBAL LIGATION  1979   Family History  Problem Relation Age of Onset  . Pernicious anemia Mother   . Cancer Father        lung/ bone  . Osteoporosis Father   . Hypothyroidism Father   . Colon cancer Sister   . Cancer - Colon Sister   . Colon polyps Neg Hx   . Rectal cancer Neg Hx   . Stomach cancer Neg Hx    Social History   Socioeconomic History  . Marital  status: Married    Spouse name: Not on file  . Number of children: Not on file  . Years of education: Not on file  . Highest education level: Not on file  Social Needs  . Financial resource strain: Not on file  . Food insecurity - worry: Not on file  . Food insecurity - inability: Not on file  . Transportation needs - medical: Not on file  . Transportation needs - non-medical: Not on file  Occupational History  . Not on file  Tobacco Use  . Smoking status: Former Smoker    Last attempt to quit: 01/17/2000    Years since quitting: 17.2  . Smokeless tobacco: Never Used  . Tobacco comment: smoking hx unknown at this time; started in college   Substance and Sexual Activity  . Alcohol use: Yes    Alcohol/week: 1.2 - 1.8 oz    Types: 2 - 3 Standard drinks or equivalent per week    Comment: 3 times a week   . Drug use: No  . Sexual activity: Not on file  Other Topics Concern  . 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 albuterol (PROAIR HFA) 108 (90 Base) MCG/ACT inhaler 2 puffs every 4 hours as needed only  if your can't catch your breath  . aspirin 81 MG chewable tablet 1/2 tablet every other day  . azithromycin (ZITHROMAX) 250 MG tablet 2 tabs day 1, then one tab daily  . cholecalciferol (VITAMIN D) 1000 units tablet Take 1,000 Units by mouth daily.  . cyanocobalamin (,VITAMIN B-12,) 1000 MCG/ML injection INJECT 1ML INTO THE MUSCLE EVERY 30 DAYS  . doxycycline (VIBRA-TABS) 100 MG tablet Take 1 tablet (100 mg total) by mouth 2 (two) times daily.  . hydrochlorothiazide (HYDRODIURIL) 25 MG tablet Take 1 tablet (25 mg total) by mouth daily.  . hyoscyamine (LEVSIN SL) 0.125 MG  SL tablet Place 2 tablets (0.25 mg total) under the tongue every 4 (four) hours as needed for cramping.  Marland Kitchen KLOR-CON M20 20 MEQ tablet TAKE 1 TABLET BY MOUTH EVERY DAY  .  predniSONE (DELTASONE) 20 MG tablet Take 2 tablets (40 mg total) by mouth daily with breakfast.  . rosuvastatin (CRESTOR) 20 MG tablet Take 1 tablet (20 mg total) by mouth daily.  Marland Kitchen telmisartan (MICARDIS) 40 MG tablet Take 1 tablet (40 mg total) by mouth daily.   No facility-administered encounter medications on file as of 03/27/2017.     Activities of Daily Living No flowsheet data found.  Patient Care Team: Lucretia Kern, DO as PCP - General (Family Medicine)    Assessment:   This is a routine wellness examination for [place].  Exercise Activities and Dietary recommendations    Goals    . Weight (lb) < 150 lb (68 kg)     Track what you eat for about 3 days Weight 3 days  Check out  online nutrition programs as GumSearch.nl and http://vang.com/; fit58me; Look for foods with "whole" wheat; bran; oatmeal etc Shot at the farmer's markets in season for fresher choices  Watch for "hydrogenated" on the label of oils which are trans-fats.  Watch for "high fructose corn syrup" in snacks, yogurt or ketchup  Meats have less marbling; bright colored fruits and vegetables;  Canned; dump out liquid and wash vegetables. Be mindful of what we are eating  Portion control is essential to a health weight! Sit down; take a break and enjoy your meal; take smaller bites; put the fork down between bites;  It takes 20 minutes to get full; so check in with your fullness cues and stop eating when you start to fill full              Fall Risk Fall Risk  03/20/2016 03/03/2015 03/13/2014 03/13/2014 06/30/2013  Falls in the past year? No No No No No     Depression Screen PHQ 2/9 Scores 03/20/2016 03/20/2016 03/03/2015 03/13/2014  PHQ - 2 Score 0 0 0 0     Cognitive Function        Immunization History  Administered Date(s)  Administered  . Influenza Whole 10/26/2006, 10/30/2007, 10/17/2011  . Influenza, High Dose Seasonal PF 09/23/2014, 09/14/2015, 10/24/2016  . Influenza,inj,Quad PF,6+ Mos 11/10/2013  . Influenza-Unspecified 10/16/2012  . Pneumococcal Conjugate-13 11/10/2013  . Pneumococcal Polysaccharide-23 01/28/2009  . Td 05/04/2008  . Zoster 05/24/2007    Qualifies for Shingles Vaccine?***  Screening Tests Health Maintenance  Topic Date Due  . MAMMOGRAM  02/16/2017  . OPHTHALMOLOGY EXAM  03/30/2017  . HEMOGLOBIN A1C  06/19/2017  . TETANUS/TDAP  05/05/2018  . COLONOSCOPY  07/28/2024  . INFLUENZA VACCINE  Completed  . Hepatitis C Screening  Completed  . PNA vac Low Risk Adult  Completed  . DEXA SCAN  Addressed         Plan:      PCP Notes ***  Health Maintenance ***  Abnormal Screens  ***  Referrals  ***  Patient concerns; ***  Nurse Concerns; ***  Next PCP apt ***      I have personally reviewed and noted the following in the patient's chart:   . Medical and social history . Use of alcohol, tobacco or illicit drugs  . Current medications and supplements . Functional ability and status . Nutritional status . Physical activity . Advanced directives . List of other physicians . Hospitalizations, surgeries, and ER visits in previous 12 months . Vitals . Screenings to include cognitive, depression, and falls . Referrals and appointments  In addition, I have reviewed and discussed with patient certain preventive protocols, quality metrics, and best practice  recommendations. A written personalized care plan for preventive services as well as general preventive health recommendations were provided to patient.     Wynetta Fines, RN  03/26/2017

## 2017-03-27 ENCOUNTER — Ambulatory Visit: Payer: Medicare HMO

## 2017-03-27 ENCOUNTER — Ambulatory Visit (INDEPENDENT_AMBULATORY_CARE_PROVIDER_SITE_OTHER): Payer: Medicare HMO | Admitting: Family Medicine

## 2017-03-27 ENCOUNTER — Encounter: Payer: Self-pay | Admitting: Family Medicine

## 2017-03-27 VITALS — BP 118/54 | HR 67 | Temp 97.9°F | Resp 16 | Ht 63.0 in | Wt 176.1 lb

## 2017-03-27 DIAGNOSIS — E785 Hyperlipidemia, unspecified: Secondary | ICD-10-CM | POA: Diagnosis not present

## 2017-03-27 DIAGNOSIS — J449 Chronic obstructive pulmonary disease, unspecified: Secondary | ICD-10-CM

## 2017-03-27 DIAGNOSIS — E538 Deficiency of other specified B group vitamins: Secondary | ICD-10-CM | POA: Diagnosis not present

## 2017-03-27 DIAGNOSIS — I10 Essential (primary) hypertension: Secondary | ICD-10-CM | POA: Diagnosis not present

## 2017-03-27 DIAGNOSIS — Z1331 Encounter for screening for depression: Secondary | ICD-10-CM

## 2017-03-27 DIAGNOSIS — R739 Hyperglycemia, unspecified: Secondary | ICD-10-CM

## 2017-03-27 LAB — BASIC METABOLIC PANEL
BUN: 17 mg/dL (ref 6–23)
CALCIUM: 9.9 mg/dL (ref 8.4–10.5)
CO2: 36 mEq/L — ABNORMAL HIGH (ref 19–32)
CREATININE: 0.65 mg/dL (ref 0.40–1.20)
Chloride: 99 mEq/L (ref 96–112)
GFR: 94.8 mL/min (ref >60.00)
Glucose, Bld: 93 mg/dL (ref 70–99)
Potassium: 4.2 mEq/L (ref 3.5–5.1)
SODIUM: 140 meq/L (ref 135–145)

## 2017-03-27 LAB — CBC
HCT: 43.2 % (ref 36.0–46.0)
Hemoglobin: 14.8 g/dL (ref 12.0–15.0)
MCHC: 34.2 g/dL (ref 30.0–36.0)
MCV: 83.3 fl (ref 78.0–100.0)
Platelets: 228 10*3/uL (ref 150.0–400.0)
RBC: 5.18 Mil/uL — AB (ref 3.87–5.11)
RDW: 13.8 % (ref 11.5–15.5)
WBC: 7.8 10*3/uL (ref 4.0–10.5)

## 2017-03-27 LAB — VITAMIN B12: Vitamin B-12: 691 pg/mL (ref 211–911)

## 2017-03-27 NOTE — Patient Instructions (Signed)
BEFORE YOU LEAVE: -Labs today, you do not need to be fasting today, please come fasting for your next follow-up visit with me in about 3-4 months -follow up:  1) AWV with susan, asap - due now 2) cancel follow-up with Dr. Maudie Mercury in May, schedule follow-up in 3-4 months  We have ordered labs or studies at this visit. It can take up to 1-2 weeks for results and processing. IF results require follow up or explanation, we will call you with instructions. Clinically stable results will be released to your [facility]. If you have not heard from [place] or cannot find your results in [facility] in 2 weeks please contact our office at [phone].  If you are not yet signed up for [facility], please consider signing up.   We recommend the following healthy lifestyle for LIFE: 1) Small portions. But, make sure to get regular (at least 3 per day), healthy meals and small healthy snacks if needed.  2) Eat a healthy clean diet.   TRY TO EAT: -at least 5-7 servings of low sugar, colorful, and nutrient rich vegetables per day (not corn, potatoes or bananas.) -berries are the best choice if you wish to eat fruit (only eat small amounts if trying to reduce weight)  -lean meets (fish, white meat of chicken or [place]) -vegan proteins for some meals - beans or tofu, whole grains, nuts and seeds -Replace bad fats with good fats - good fats include: fish, nuts and seeds, canola oil, olive oil -small amounts of low fat or non fat dairy -small amounts of100 % whole grains - check the lables -drink plenty of water  AVOID: -SUGAR, sweets, anything with added sugar, corn syrup or sweeteners - must read labels as even foods advertised as "healthy" often are loaded with sugar -if you must have a sweetener, small amounts of stevia may be best -sweetened beverages and artificially sweetened beverages -simple starches (rice, bread, potatoes, pasta, chips, etc - small amounts of 100% whole grains are ok) -red meat, pork,  butter -fried foods, fast food, processed food, excessive dairy, eggs and coconut.  3)Get at least 150 minutes of sweaty aerobic exercise per week.  4)Reduce stress - consider counseling, meditation and relaxation to balance other aspects of your life.

## 2017-03-28 ENCOUNTER — Encounter: Payer: Self-pay | Admitting: Family Medicine

## 2017-03-29 ENCOUNTER — Other Ambulatory Visit: Payer: Self-pay | Admitting: Family Medicine

## 2017-03-29 MED ORDER — ROSUVASTATIN CALCIUM 10 MG PO TABS: 10.0000 mg | ORAL_TABLET | Freq: Every day | ORAL | 3 refills | Status: DC

## 2017-04-02 DIAGNOSIS — Z961 Presence of intraocular lens: Secondary | ICD-10-CM | POA: Diagnosis not present

## 2017-04-02 DIAGNOSIS — H43392 Other vitreous opacities, left eye: Secondary | ICD-10-CM | POA: Diagnosis not present

## 2017-04-02 DIAGNOSIS — H43813 Vitreous degeneration, bilateral: Secondary | ICD-10-CM | POA: Diagnosis not present

## 2017-04-03 ENCOUNTER — Other Ambulatory Visit: Payer: Self-pay | Admitting: *Deleted

## 2017-04-03 MED ORDER — POTASSIUM CHLORIDE CRYS ER 20 MEQ PO TBCR: 20.0000 meq | EXTENDED_RELEASE_TABLET | Freq: Every day | ORAL | 1 refills | Status: DC

## 2017-04-20 DIAGNOSIS — R69 Illness, unspecified: Secondary | ICD-10-CM | POA: Diagnosis not present

## 2017-04-26 DIAGNOSIS — Z124 Encounter for screening for malignant neoplasm of cervix: Secondary | ICD-10-CM | POA: Diagnosis not present

## 2017-04-26 DIAGNOSIS — N3945 Continuous leakage: Secondary | ICD-10-CM | POA: Diagnosis not present

## 2017-04-26 DIAGNOSIS — Z6830 Body mass index (BMI) 30.0-30.9, adult: Secondary | ICD-10-CM | POA: Diagnosis not present

## 2017-04-26 DIAGNOSIS — Z1231 Encounter for screening mammogram for malignant neoplasm of breast: Secondary | ICD-10-CM | POA: Diagnosis not present

## 2017-04-30 ENCOUNTER — Other Ambulatory Visit: Payer: Self-pay | Admitting: Obstetrics and Gynecology

## 2017-04-30 DIAGNOSIS — R928 Other abnormal and inconclusive findings on diagnostic imaging of breast: Secondary | ICD-10-CM

## 2017-04-30 DIAGNOSIS — L821 Other seborrheic keratosis: Secondary | ICD-10-CM | POA: Diagnosis not present

## 2017-04-30 DIAGNOSIS — L570 Actinic keratosis: Secondary | ICD-10-CM | POA: Diagnosis not present

## 2017-04-30 DIAGNOSIS — L603 Nail dystrophy: Secondary | ICD-10-CM | POA: Diagnosis not present

## 2017-04-30 DIAGNOSIS — D225 Melanocytic nevi of trunk: Secondary | ICD-10-CM | POA: Diagnosis not present

## 2017-04-30 DIAGNOSIS — D2272 Melanocytic nevi of left lower limb, including hip: Secondary | ICD-10-CM | POA: Diagnosis not present

## 2017-04-30 DIAGNOSIS — L820 Inflamed seborrheic keratosis: Secondary | ICD-10-CM | POA: Diagnosis not present

## 2017-05-02 ENCOUNTER — Other Ambulatory Visit: Payer: Self-pay | Admitting: Obstetrics and Gynecology

## 2017-05-02 ENCOUNTER — Ambulatory Visit
Admission: RE | Admit: 2017-05-02 | Discharge: 2017-05-02 | Disposition: A | Discharge status: Home / Self Care | Payer: Medicare HMO | Source: Ambulatory Visit | Attending: Obstetrics and Gynecology | Admitting: Obstetrics and Gynecology

## 2017-05-02 DIAGNOSIS — R928 Other abnormal and inconclusive findings on diagnostic imaging of breast: Secondary | ICD-10-CM | POA: Diagnosis not present

## 2017-05-02 DIAGNOSIS — N6320 Unspecified lump in the left breast, unspecified quadrant: Secondary | ICD-10-CM

## 2017-05-03 ENCOUNTER — Other Ambulatory Visit: Payer: Self-pay | Admitting: Obstetrics and Gynecology

## 2017-05-03 DIAGNOSIS — N6320 Unspecified lump in the left breast, unspecified quadrant: Secondary | ICD-10-CM

## 2017-05-04 ENCOUNTER — Ambulatory Visit
Admission: RE | Admit: 2017-05-04 | Discharge: 2017-05-04 | Disposition: A | Discharge status: Home / Self Care | Payer: Medicare HMO | Source: Ambulatory Visit | Attending: Obstetrics and Gynecology | Admitting: Obstetrics and Gynecology

## 2017-05-04 ENCOUNTER — Inpatient Hospital Stay
Admission: RE | Admit: 2017-05-04 | Discharge: 2017-05-04 | Disposition: A | Discharge status: Home / Self Care | Payer: Medicare HMO | Source: Ambulatory Visit | Attending: Obstetrics and Gynecology | Admitting: Obstetrics and Gynecology

## 2017-05-04 DIAGNOSIS — N6320 Unspecified lump in the left breast, unspecified quadrant: Secondary | ICD-10-CM

## 2017-05-04 DIAGNOSIS — C50212 Malignant neoplasm of upper-inner quadrant of left female breast: Secondary | ICD-10-CM | POA: Diagnosis not present

## 2017-05-04 DIAGNOSIS — N6322 Unspecified lump in the left breast, upper inner quadrant: Secondary | ICD-10-CM | POA: Diagnosis not present

## 2017-05-08 ENCOUNTER — Encounter: Payer: Self-pay | Admitting: *Deleted

## 2017-05-08 ENCOUNTER — Telehealth: Payer: Self-pay | Admitting: Oncology

## 2017-05-08 DIAGNOSIS — C50212 Malignant neoplasm of upper-inner quadrant of left female breast: Secondary | ICD-10-CM | POA: Insufficient documentation

## 2017-05-08 DIAGNOSIS — Z170 Estrogen receptor positive status [ER+]: Secondary | ICD-10-CM

## 2017-05-08 NOTE — Telephone Encounter (Signed)
Patient called to confirm afternoon [facility] appointment on 5/1, packet emailed to patient

## 2017-05-16 ENCOUNTER — Inpatient Hospital Stay: Payer: Medicare HMO

## 2017-05-16 ENCOUNTER — Encounter: Payer: Self-pay | Admitting: *Deleted

## 2017-05-16 ENCOUNTER — Other Ambulatory Visit: Payer: Self-pay | Admitting: General Surgery

## 2017-05-16 ENCOUNTER — Ambulatory Visit
Admission: RE | Admit: 2017-05-16 | Discharge: 2017-05-16 | Disposition: A | Discharge status: Home / Self Care | Payer: Medicare HMO | Source: Ambulatory Visit | Attending: Radiation Oncology | Admitting: Radiation Oncology

## 2017-05-16 ENCOUNTER — Ambulatory Visit: Payer: Medicare HMO | Admitting: Physical Therapy

## 2017-05-16 ENCOUNTER — Encounter: Payer: Self-pay | Admitting: Hematology and Oncology

## 2017-05-16 ENCOUNTER — Inpatient Hospital Stay: Payer: Medicare HMO | Attending: Hematology and Oncology | Admitting: Hematology and Oncology

## 2017-05-16 DIAGNOSIS — Z170 Estrogen receptor positive status [ER+]: Principal | ICD-10-CM

## 2017-05-16 DIAGNOSIS — C50212 Malignant neoplasm of upper-inner quadrant of left female breast: Secondary | ICD-10-CM

## 2017-05-16 DIAGNOSIS — C50412 Malignant neoplasm of upper-outer quadrant of left female breast: Secondary | ICD-10-CM

## 2017-05-16 LAB — RESEARCH LABS

## 2017-05-16 NOTE — Progress Notes (Signed)
[facility] NOTE  Patient Care Team: Lucretia Kern, DO as PCP - General (Family Medicine) Rolm Bookbinder, MD as Consulting Physician (General Surgery) Nicholas Lose, MD as Consulting Physician (Hematology and Oncology) Kyung Rudd, MD as Consulting Physician (Radiation Oncology)  CHIEF COMPLAINTS/PURPOSE OF CONSULTATION:  Newly diagnosed breast cancer  HISTORY OF PRESENTING ILLNESS:  Cassandra James 72 y.o. female is here because of recent diagnosis of left breast cancer.  Patient had a routine screening mammogram that detected abnormality in the left breast at 11:30 position measuring 0.4 cm.  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  She was presented this morning to the multidisciplinary tumor board and she is here today accompanied by her husband to discuss treatment plan.  Her husband is a retired Doctor, general practice.  They moved from [facility] about 10 years ago and is looking to move to [place] in the next few months. I reviewed her records extensively and collaborated the history with the patient.  SUMMARY OF ONCOLOGIC HISTORY:   Malignant neoplasm of upper-inner quadrant of left breast in female, estrogen receptor positive ([place])   05/04/2017 Initial Diagnosis    Screening detected left breast mass at 11:30 position: 0.4 cm, axilla negative, biopsy revealed grade 1 IDC ER 100%, PR 60%, Ki-67 10%, HER-2 negative ratio 1.4, T1 a N0 stage I a clinical stage      MEDICAL HISTORY:  Past Medical History:  Diagnosis Date  . Cataract   . Colon polyps    history of  . COLONIC POLYPS, HX OF 08/09/2006   Qualifier: Diagnosis of  By: Scherrie Gerlach    . DECREASED HEARING 11/25/2008   Qualifier: Diagnosis of  By: Scherrie Gerlach    . Elevated LFTs    obstructive disease  . HIATAL HERNIA 02/05/2009   Qualifier: Diagnosis of  By: Harriet Pho    . Hyperglycemia 11/10/2013   . Hyperlipidemia   . Hypertension   . Osteoporosis    was osteopenia-last bone scan all clear   . Pernicious anemia   . Sleep apnea 01/14/2016    SURGICAL HISTORY: Past Surgical History:  Procedure Laterality Date  . [place] RELEASE Left 2016  . cataract surgery  2011   Bilateral  . COLONOSCOPY    . HAND TENDON SURGERY  01/16/2014   Dr Burney Gauze  . OVARIAN CYST SURGERY  1968  . RETINAL DETACHMENT SURGERY  2011   left  . TOENAIL EXCISION Right    Dr Patrick Jupiter great toenail excision  . TRAPEZIUM RESECTION Left 2016   left wrist   . TUBAL LIGATION  1979    SOCIAL HISTORY: Social History   Socioeconomic History  . Marital status: Married    Spouse name: Not on file  . Number of children: Not on file  . Years of education: Not on file  . Highest education level: Not on file  Occupational History  . Not on file  Social Needs  . Financial resource strain: Not on file  . Food insecurity:    Worry: Not on file    Inability: Not on file  . Transportation needs:    Medical: Not on file    Non-medical: Not on file  Tobacco Use  . Smoking status: Former Smoker    Last attempt to quit: 01/17/2000    Years since quitting: 17.3  .  Smokeless tobacco: Never Used  . Tobacco comment: smoking hx unknown at this time; started in college   Substance and Sexual Activity  . Alcohol use: Yes    Alcohol/week: 1.2 - 1.8 oz    Types: 2 - 3 Standard drinks or equivalent per week    Comment: 3 times a week   . Drug use: No  . Sexual activity: Not on file  Lifestyle  . Physical activity:    Days per week: Not on file    Minutes per session: Not on file  . Stress: Not on file  Relationships  . Social connections:    Talks on phone: Not on file    Gets together: Not on file    Attends religious service: Not on file    Active member of club or organization: Not on file    Attends meetings of clubs or organizations: Not on file     Relationship status: Not on file  . Intimate partner violence:    Fear of current or ex partner: Not on file    Emotionally abused: Not on file    Physically abused: Not on file    Forced sexual activity: Not on file  Other Topics Concern  . 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 Pernicious anemia Mother   . Cancer Father        lung/ bone  . Osteoporosis Father   . Hypothyroidism Father   . Colon cancer Sister   . Cancer - Colon Sister   . Pancreatic cancer Maternal Grandfather   . Colon polyps Neg Hx   . Rectal cancer Neg Hx   . Stomach cancer Neg Hx     ALLERGIES:  is allergic to cephalexin; cephalosporins; codeine; and tessalon [benzonatate].  MEDICATIONS:  Current Outpatient Medications  Medication Sig Dispense Refill  . albuterol (PROAIR HFA) 108 (90 Base) MCG/ACT inhaler 2 puffs every 4 hours as needed only  if your can't catch your breath 1 Inhaler 11  . cholecalciferol (VITAMIN D) 1000 units tablet Take 1,000 Units by mouth daily.    . cyanocobalamin (,VITAMIN B-12,) 1000 MCG/ML injection INJECT 1ML INTO THE MUSCLE EVERY 30 DAYS 12 mL 3  . hydrochlorothiazide (HYDRODIURIL) 25 MG tablet Take 1 tablet (25 mg total) by mouth daily. 90 tablet 0  . hyoscyamine (LEVSIN SL) 0.125 MG SL tablet Place 2 tablets (0.25 mg total) under the tongue every 4 (four) hours as needed for cramping. 90 tablet 0  . Omega-3 Fatty Acids (OMEGA-3 FISH OIL EX ST PO) Take by mouth daily.    . potassium chloride SA (KLOR-CON M20) 20 MEQ tablet Take 1 tablet (20 mEq total) by mouth daily. 90 tablet 1  . rosuvastatin (CRESTOR) 10 MG tablet Take 1 tablet (10 mg total) by mouth daily. 90  tablet 3  . telmisartan (MICARDIS) 40 MG tablet Take 1 tablet (40 mg total) by mouth daily. 90 tablet 1  . aspirin 81 MG chewable tablet 1/2 tablet every other day     No current facility-administered medications for  this visit.     REVIEW OF SYSTEMS:   Constitutional: Denies fevers, chills or abnormal night sweats Eyes: Denies blurriness of vision, double vision or watery eyes Ears, nose, mouth, throat, and face: Denies mucositis or sore throat Respiratory: Denies cough, dyspnea or wheezes Cardiovascular: Denies palpitation, chest discomfort or lower extremity swelling Gastrointestinal:  Denies nausea, heartburn or change in bowel habits Skin: Denies abnormal skin rashes Lymphatics: Denies new lymphadenopathy or easy bruising Neurological:Denies numbness, tingling or new weaknesses Behavioral/Psych: Mood is stable, no new changes  Breast:  Denies any palpable lumps or discharge All other systems were reviewed with the patient and are negative.  PHYSICAL EXAMINATION: ECOG PERFORMANCE STATUS: 1 - Symptomatic but completely ambulatory  Vitals:   05/16/17 1251  BP: 135/69  Pulse: 84  Resp: 16  Temp: 98.4 F (36.9 C)  SpO2: 95%   Filed Weights   05/16/17 1251  Weight: 176 lb 14.4 oz (80.2 kg)    GENERAL:alert, no distress and comfortable SKIN: skin color, texture, turgor are normal, no rashes or significant lesions EYES: normal, conjunctiva are pink and non-injected, sclera clear OROPHARYNX:no exudate, no erythema and lips, buccal mucosa, and tongue normal  NECK: supple, thyroid normal size, non-tender, without nodularity LYMPH:  no palpable lymphadenopathy in the cervical, axillary or inguinal LUNGS: clear to auscultation and percussion with normal breathing effort HEART: regular rate & rhythm and no murmurs and no lower extremity edema ABDOMEN:abdomen soft, non-tender and normal bowel sounds Musculoskeletal:no cyanosis of digits and no clubbing  PSYCH: alert & oriented x  3 with fluent speech NEURO: no focal motor/sensory deficits BREAST: No palpable nodules in breast. No palpable axillary or supraclavicular lymphadenopathy (exam performed in the presence of a chaperone)   LABORATORY DATA:  I have reviewed the data as listed Lab Results  Component Value Date   WBC 7.8 03/27/2017   HGB 14.8 03/27/2017   HCT 43.2 03/27/2017   MCV 83.3 03/27/2017   PLT 228.0 03/27/2017   Lab Results  Component Value Date   NA 140 03/27/2017   K 4.2 03/27/2017   CL 99 03/27/2017   CO2 36 (H) 03/27/2017    RADIOGRAPHIC STUDIES: I have personally reviewed the radiological reports and agreed with the findings in the report.  ASSESSMENT AND PLAN:  Malignant neoplasm of upper-inner quadrant of left breast in female, estrogen receptor positive ([place]) 05/04/2017:Screening detected left breast mass at 11:30 position: 0.4 cm, axilla negative, biopsy revealed grade 1 IDC ER 100%, PR 60%, Ki-67 10%, HER-2 negative ratio 1.4, T1 a N0 stage I a clinical stage  Pathology and radiology counseling: Discussed with the patient, the details of pathology including the type of breast cancer,the clinical staging, the significance of ER, PR and HER-2/neu receptors and the implications for treatment. After reviewing the pathology in detail, we proceeded to discuss the different treatment options between surgery, radiation, chemotherapy, antiestrogen therapies.  Recommendation: 1.  Breast conserving surgery 2. adjuvant radiation therapy 3.  Followed by adjuvant antiestrogen therapy with letrozole 2.5 mg daily x5 years   Patient informed me that she may be moving to [place] after her initial therapy and she will need to be set up with an oncologist locally over there.  Return to clinic 1 week after surgery to discuss the pathology report   All questions were answered. The patient knows to call the clinic with any problems, questions or concerns.    Harriette Ohara, MD 05/16/17

## 2017-05-16 NOTE — Progress Notes (Signed)
Nutrition Assessment  Reason for Assessment:  Pt seen in Breast Clinic  ASSESSMENT:   72 year old female with new diagnosis of breast cancer.  Past medical history reviewed.    Patient reports normal appetite.  Medications:  reviewed  Labs: none taken today  Anthropometrics:   Height: 63 inches Weight: 176 lb BMI: 31   NUTRITION DIAGNOSIS: Food and nutrition related knowledge deficit related to new diagnosis of breast cancer as evidenced by no prior need for nutrition related information.  INTERVENTION:   Discussed and provided packet of information regarding nutritional tips for breast cancer patients.  Questions answered.  Teachback method used.  Contact information provided and patient knows to contact me with questions/concerns.    MONITORING, EVALUATION, and GOAL: Pt will consume a healthy plant based diet to maintain lean body mass throughout treatment.   Cassandra James, [place], [place] Registered Dietitian [phone] (pager)

## 2017-05-16 NOTE — Assessment & Plan Note (Signed)
05/04/2017:Screening detected left breast mass at 11:30 position: 0.4 cm, axilla negative, biopsy revealed grade 1 IDC ER 100%, PR 60%, Ki-67 10%, HER-2 negative ratio 1.4, T1 a N0 stage I a clinical stage  Pathology and radiology counseling: Discussed with the patient, the details of pathology including the type of breast cancer,the clinical staging, the significance of ER, PR and HER-2/neu receptors and the implications for treatment. After reviewing the pathology in detail, we proceeded to discuss the different treatment options between surgery, radiation, chemotherapy, antiestrogen therapies.  Recommendation: 1.  Breast conserving surgery 2. adjuvant radiation therapy 3.  Followed by adjuvant antiestrogen therapy with letrozole 2.5 mg daily x5 years   Patient informed me that she may be moving to [place] after her initial therapy and she will need to be set up with an oncologist locally over there.  Return to clinic 1 week after surgery to discuss the pathology report

## 2017-05-16 NOTE — Progress Notes (Signed)
Radiation Oncology         [phone] ________________________________  Name: Cassandra James        MRN: [id number]  Date of Service: 05/16/2017 DOB: 1945-07-11  CC:Kim, Nickola Major, DO  Rolm Bookbinder, MD     REFERRING PHYSICIAN: Rolm Bookbinder, MD   DIAGNOSIS: The encounter diagnosis was Malignant neoplasm of upper-inner quadrant of left breast in female, estrogen receptor positive ([place]).   HISTORY OF PRESENT ILLNESS: Cassandra James is a 72 y.o. female seen in the multidisciplinary breast clinic for a new diagnosis of left breast cancer. The patient was noted to have screening detected mass in the left breast at 11:30 position. She had diagnostic imaging which revealed a mass at the 11:30 site measuring 4 x 4 x 3 mm. She underwent a biopsy of this site on 05/04/17 which revealed a grade 1, invasive ductal carcinoma with DCIS, ER/PR positive, HER2 negative, and her Ki 67 was 10%. She comes today to discuss options of treatment of her cancer.   PREVIOUS RADIATION THERAPY: No   PAST MEDICAL HISTORY:  Past Medical History:  Diagnosis Date  . Cataract   . Colon polyps    history of  . COLONIC POLYPS, HX OF 08/09/2006   Qualifier: Diagnosis of  By: Scherrie Gerlach    . DECREASED HEARING 11/25/2008   Qualifier: Diagnosis of  By: Scherrie Gerlach    . Elevated LFTs    obstructive disease  . HIATAL HERNIA 02/05/2009   Qualifier: Diagnosis of  By: Harriet Pho    . Hyperglycemia 11/10/2013  . Hyperlipidemia   . Hypertension   . Osteoporosis    was osteopenia-last bone scan all clear   . Pernicious anemia   . Sleep apnea 01/14/2016       PAST SURGICAL HISTORY: Past Surgical History:  Procedure Laterality Date  . [place] RELEASE Left 2016  . cataract surgery  2011   Bilateral  . COLONOSCOPY    . HAND TENDON SURGERY  01/16/2014   Dr Burney Gauze  . OVARIAN CYST SURGERY  1968  . RETINAL DETACHMENT SURGERY  2011   left  .  TOENAIL EXCISION Right    Dr Patrick Jupiter great toenail excision  . TRAPEZIUM RESECTION Left 2016   left wrist   . TUBAL LIGATION  1979     FAMILY HISTORY:  Family History  Problem Relation Age of Onset  . Pernicious anemia Mother   . Cancer Father        lung/ bone  . Osteoporosis Father   . Hypothyroidism Father   . Colon cancer Sister   . Cancer - Colon Sister   . Colon polyps Neg Hx   . Rectal cancer Neg Hx   . Stomach cancer Neg Hx      SOCIAL HISTORY:  reports that she quit smoking about 17 years ago. She has never used smokeless tobacco. She reports that she drinks about 1.2 - 1.8 oz of alcohol per week. She reports that she does not use drugs. The patient is married and lives in [place]. She is retired from working as a Marine scientist, her husband is a retired Doctor, general practice.   ALLERGIES: Cephalexin; Cephalosporins; Codeine; and Tessalon [benzonatate]   MEDICATIONS:  Current Outpatient Medications  Medication Sig Dispense Refill  . albuterol (PROAIR HFA) 108 (90 Base) MCG/ACT inhaler 2 puffs every 4 hours as needed only  if your can't catch your breath 1 Inhaler 11  .  aspirin 81 MG chewable tablet 1/2 tablet every other day    . cholecalciferol (VITAMIN D) 1000 units tablet Take 1,000 Units by mouth daily.    . cyanocobalamin (,VITAMIN B-12,) 1000 MCG/ML injection INJECT 1ML INTO THE MUSCLE EVERY 30 DAYS 12 mL 3  . hydrochlorothiazide (HYDRODIURIL) 25 MG tablet Take 1 tablet (25 mg total) by mouth daily. 90 tablet 0  . hyoscyamine (LEVSIN SL) 0.125 MG SL tablet Place 2 tablets (0.25 mg total) under the tongue every 4 (four) hours as needed for cramping. 90 tablet 0  . potassium chloride SA (KLOR-CON M20) 20 MEQ tablet Take 1 tablet (20 mEq total) by mouth daily. 90 tablet 1  . rosuvastatin (CRESTOR) 10 MG tablet Take 1 tablet (10 mg total) by mouth daily. 90 tablet 3  . telmisartan (MICARDIS) 40 MG tablet Take 1 tablet (40 mg total) by mouth daily. 90 tablet 1   No  current facility-administered medications for this encounter.      REVIEW OF SYSTEMS: On review of systems, the patient reports that she is doing well overall. She denies any chest pain, shortness of breath, cough, fevers, chills, night sweats, unintended weight changes. She denies any bowel or bladder disturbances, and denies abdominal pain, nausea or vomiting. She denies any new musculoskeletal or joint aches or pains. A complete review of systems is obtained and is otherwise negative.     PHYSICAL EXAM:  Wt Readings from Last 3 Encounters:  03/27/17 176 lb 1.6 oz (79.9 kg)  03/12/17 176 lb 12.8 oz (80.2 kg)  03/01/17 181 lb (82.1 kg)   Temp Readings from Last 3 Encounters:  03/27/17 97.9 F (36.6 C) (Oral)  03/12/17 98.4 F (36.9 C) (Oral)  03/01/17 (!) 97.5 F (36.4 C) (Oral)   BP Readings from Last 3 Encounters:  03/27/17 (!) 118/54  03/12/17 140/60  03/01/17 132/82   Pulse Readings from Last 3 Encounters:  03/27/17 67  03/12/17 72  03/01/17 63     In general this is a well appearing caucasian female in no acute distress. She is alert and oriented x4 and appropriate throughout the examination. HEENT reveals that the patient is normocephalic, atraumatic. EOMs are intact. PERRLA. Skin is intact without any evidence of gross lesions. Cardiovascular exam reveals a regular rate and rhythm, no clicks rubs or murmurs are auscultated. Chest is clear to auscultation bilaterally. Lymphatic assessment is performed and does not reveal any adenopathy in the cervical, supraclavicular, axillary, or inguinal chains. Bilateral breast exam is performed and reveals mild ecchymosis of the left breast, no palpable mass is noted, and there is no mass in the right breast. No nipple bleeding or discharge is noted of either breast. Abdomen has active bowel sounds in all quadrants and is intact. The abdomen is soft, non tender, non distended. Lower extremities are negative for pretibial pitting edema,  deep calf tenderness, cyanosis or clubbing.   ECOG = 0  0 - Asymptomatic (Fully active, able to carry on all predisease activities without restriction)  1 - Symptomatic but completely ambulatory (Restricted in physically strenuous activity but ambulatory and able to carry out work of a light or sedentary nature. For example, light housework, office work)  2 - Symptomatic, <50% in bed during the day (Ambulatory and capable of all self care but unable to carry out any work activities. Up and about more than 50% of waking hours)  3 - Symptomatic, >50% in bed, but not bedbound (Capable of only limited self-care, confined to bed  or chair 50% or more of waking hours)  4 - Bedbound (Completely disabled. Cannot carry on any self-care. Totally confined to bed or chair)  5 - Death   Eustace Pen MM, Creech RH, Tormey DC, et al. [phone]). "Toxicity and response criteria of the [facility]". [place] Oncol. 5 (6): 649-55    LABORATORY DATA:  Lab Results  Component Value Date   WBC 7.8 03/27/2017   HGB 14.8 03/27/2017   HCT 43.2 03/27/2017   MCV 83.3 03/27/2017   PLT 228.0 03/27/2017   Lab Results  Component Value Date   NA 140 03/27/2017   K 4.2 03/27/2017   CL 99 03/27/2017   CO2 36 (H) 03/27/2017   Lab Results  Component Value Date   ALT 33 01/15/2016   AST 33 01/15/2016   ALKPHOS 43 01/15/2016   BILITOT 0.5 01/15/2016      RADIOGRAPHY: US Breast Ltd Uni Left Inc Axilla  Result Date: 05/02/2017 CLINICAL DATA:  72 year old female for evaluation of possible LEFT breast mass on screening mammogram. EXAM: DIGITAL DIAGNOSTIC LEFT MAMMOGRAM WITH TOMO ULTRASOUND LEFT BREAST COMPARISON:  Previous exam(s). ACR Breast Density Category b: There are scattered areas of fibroglandular density. FINDINGS: 2D and 3D spot compression views of the LEFT breast demonstrate a persistent irregular 0.4 cm mass within the UPPER slightly INNER LEFT breast. Targeted ultrasound is  performed, showing a 0.3 x 0.4 x 0.4 cm irregular hypoechoic mass at the 11:30 position of the LEFT breast 4 cm from the nipple, corresponding to the mammographic finding. No abnormal LEFT axillary lymph nodes are identified. IMPRESSION: Suspicious 0.4 cm mass within the UPPER slightly INNER LEFT breast. Tissue sampling is recommended. RECOMMENDATION: Ultrasound-guided LEFT breast biopsy, which has been scheduled for 05/04/2017. I have discussed the findings and recommendations with the patient. Results were also provided in writing at the conclusion of the visit. If applicable, a reminder letter will be sent to the patient regarding the next appointment. BI-RADS CATEGORY  4: Suspicious. Electronically Signed   By: Margarette Canada M.D.   On: 05/02/2017 15:32   Mm Diag Breast Tomo Uni Left  Result Date: 05/02/2017 CLINICAL DATA:  72 year old female for evaluation of possible LEFT breast mass on screening mammogram. EXAM: DIGITAL DIAGNOSTIC LEFT MAMMOGRAM WITH TOMO ULTRASOUND LEFT BREAST COMPARISON:  Previous exam(s). ACR Breast Density Category b: There are scattered areas of fibroglandular density. FINDINGS: 2D and 3D spot compression views of the LEFT breast demonstrate a persistent irregular 0.4 cm mass within the UPPER slightly INNER LEFT breast. Targeted ultrasound is performed, showing a 0.3 x 0.4 x 0.4 cm irregular hypoechoic mass at the 11:30 position of the LEFT breast 4 cm from the nipple, corresponding to the mammographic finding. No abnormal LEFT axillary lymph nodes are identified. IMPRESSION: Suspicious 0.4 cm mass within the UPPER slightly INNER LEFT breast. Tissue sampling is recommended. RECOMMENDATION: Ultrasound-guided LEFT breast biopsy, which has been scheduled for 05/04/2017. I have discussed the findings and recommendations with the patient. Results were also provided in writing at the conclusion of the visit. If applicable, a reminder letter will be sent to the patient regarding the next  appointment. BI-RADS CATEGORY  4: Suspicious. Electronically Signed   By: Margarette Canada M.D.   On: 05/02/2017 15:32   Mm Clip Placement Left  Result Date: 05/04/2017 CLINICAL DATA:  Status post ultrasound-guided core needle biopsy of a 4 mm mass in the 11:30 o'clock position of the left breast. EXAM: DIAGNOSTIC LEFT MAMMOGRAM POST  ULTRASOUND BIOPSY COMPARISON:  Previous exam(s). FINDINGS: Mammographic images were obtained following ultrasound guided biopsy of the recently demonstrated 4 mm mass in the 11:30 o'clock position of the left breast. These demonstrate a ribbon shaped biopsy marker clip at the location of the biopsied mass. IMPRESSION: Appropriate clip deployment following left breast ultrasound-guided core needle biopsy. Final Assessment: Post Procedure Mammograms for Marker Placement Electronically Signed   By: Claudie Revering M.D.   On: 05/04/2017 13:49   [place] Lt Breast Bx W Loc Dev 1st Lesion Img Bx Spec US Guide  Addendum Date: 05/07/2017   ADDENDUM REPORT: 05/07/2017 14:16 ADDENDUM: Pathology revealed GRADE I - INVASIVE DUCTAL CARCINOMA, DUCTAL CARCINOMA IN SITU of LEFT breast, 11:30 o'clock. This was found to be concordant by Dr. Claudie Revering. Pathology results were discussed with the patient by telephone. The patient reported doing well after the biopsy with tenderness at the site. Post biopsy instructions and care were reviewed and questions were answered. The patient was encouraged to call [facility] for any additional concerns. The patient was referred to [facility] on May 16, 2017. Pathology results reported by Roselind Messier, RN on 05/07/2017. Electronically Signed   By: Claudie Revering M.D.   On: 05/07/2017 14:16   Result Date: 05/07/2017 CLINICAL DATA:  4 mm irregular mass with imaging features suspicious for malignancy in the 11:30 o'clock position of the left breast at recent mammography and  ultrasound. EXAM: ULTRASOUND GUIDED LEFT BREAST CORE NEEDLE BIOPSY COMPARISON:  Previous exam(s). FINDINGS: I met with the patient and we discussed the procedure of ultrasound-guided biopsy, including benefits and alternatives. We discussed the high likelihood of a successful procedure. We discussed the risks of the procedure, including infection, bleeding, tissue injury, clip migration, and inadequate sampling. Informed written consent was given. The usual time-out protocol was performed immediately prior to the procedure. Lesion quadrant: Upper inner quadrant Using sterile technique and 1% Lidocaine as local anesthetic, under direct ultrasound visualization, a 12 gauge spring-loaded device was used to perform biopsy of the recently demonstrated 4 mm irregular hypoechoic mass in the 11:30 o'clock position of the left breast, 4 cm from the nipple, using a lateral approach. At the conclusion of the procedure a ribbon shaped tissue marker clip was deployed into the biopsy cavity. Follow up 2 view mammogram was performed and dictated separately. IMPRESSION: Ultrasound guided biopsy of the recently demonstrated 4 mm mass in the 11:30 o'clock position of the left breast. No apparent complications. Electronically Signed: By: Claudie Revering M.D. On: 05/04/2017 13:33       IMPRESSION/PLAN: 1. 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 Dr. Lisbeth Renshaw discusses the pathology findings and reviews the nature of invasive breast disease. The consensus from the breast conference includes breast conservation with lumpectomy. Chemotherapy does not appear to be indicated and she would then proceed with external radiotherapy to the breast followed by antiestrogen therapy. We discussed the risks, benefits, short, and long term effects of radiotherapy, and the patient is interested in proceeding. Dr. Lisbeth Renshaw discusses the delivery and logistics of radiotherapy and anticipates a course  of 4 weeks of radiotherapy with deep inspiration breath hold technique. We will see her back about 2 weeks after surgery to discuss the simulation process and anticipate we starting radiotherapy about 4-6 weeks after surgery.    The above documentation reflects my direct findings during this shared  patient visit. Please see the separate note by Dr. Lisbeth Renshaw on this date for the remainder of the patient's plan of care.    Carola Rhine, PAC

## 2017-05-17 ENCOUNTER — Encounter: Payer: Self-pay | Admitting: General Practice

## 2017-05-17 NOTE — Progress Notes (Signed)
[place] Psychosocial Distress Screening Spiritual Care  Met with Cassandra James and husband Cassandra James in [facility] to introduce [facility] team/resources, reviewing distress screen per protocol.  The patient scored a 1 on the Psychosocial Distress Thermometer which indicates mild distress. Also assessed for distress and other psychosocial needs.   ONCBCN DISTRESS SCREENING 05/17/2017  Screening Type Initial Screening  Distress experienced in past week (1-10) 1  Information Concerns Type Lack of info about treatment;Lack of info about complementary therapy choices  Referral to support programs Yes   Couple reports minimal distress, particularly using perspective to cope with this new challenge.   Follow up needed: No. Couple is aware of ongoing [facility] team/programming availability, but please also page if needs arise or circumstances change. Thank you.   [place], [place], [facility] Pager [phone] Voicemail [phone]

## 2017-05-21 ENCOUNTER — Other Ambulatory Visit: Payer: Self-pay | Admitting: General Surgery

## 2017-05-21 ENCOUNTER — Other Ambulatory Visit: Payer: Self-pay | Admitting: *Deleted

## 2017-05-21 ENCOUNTER — Telehealth: Payer: Self-pay | Admitting: Hematology and Oncology

## 2017-05-21 DIAGNOSIS — C50212 Malignant neoplasm of upper-inner quadrant of left female breast: Secondary | ICD-10-CM

## 2017-05-21 DIAGNOSIS — Z170 Estrogen receptor positive status [ER+]: Principal | ICD-10-CM

## 2017-05-21 DIAGNOSIS — C50412 Malignant neoplasm of upper-outer quadrant of left female breast: Secondary | ICD-10-CM

## 2017-05-21 NOTE — Progress Notes (Signed)
Spoke to pt concerning [place] from 05/16/17. Denies questions or concerns regarding dx or treatment care plan. Encourage pt to call with needs. Received verbal understanding. Discussed next steps in appts after sx.

## 2017-05-21 NOTE — Telephone Encounter (Signed)
Tried to call regarding 5/16

## 2017-05-22 ENCOUNTER — Encounter (HOSPITAL_COMMUNITY)
Admission: RE | Admit: 2017-05-22 | Discharge: 2017-05-22 | Disposition: A | Discharge status: Home / Self Care | Payer: Medicare HMO | Source: Ambulatory Visit | Attending: General Surgery | Admitting: General Surgery

## 2017-05-22 ENCOUNTER — Ambulatory Visit
Admission: RE | Admit: 2017-05-22 | Discharge: 2017-05-22 | Disposition: A | Discharge status: Home / Self Care | Payer: Medicare HMO | Source: Ambulatory Visit | Attending: General Surgery | Admitting: General Surgery

## 2017-05-22 ENCOUNTER — Encounter (HOSPITAL_COMMUNITY): Payer: Self-pay

## 2017-05-22 ENCOUNTER — Encounter: Payer: Self-pay | Admitting: Radiation Oncology

## 2017-05-22 ENCOUNTER — Other Ambulatory Visit: Payer: Self-pay

## 2017-05-22 DIAGNOSIS — Z170 Estrogen receptor positive status [ER+]: Secondary | ICD-10-CM | POA: Diagnosis not present

## 2017-05-22 DIAGNOSIS — C50412 Malignant neoplasm of upper-outer quadrant of left female breast: Secondary | ICD-10-CM

## 2017-05-22 DIAGNOSIS — J449 Chronic obstructive pulmonary disease, unspecified: Secondary | ICD-10-CM | POA: Diagnosis not present

## 2017-05-22 DIAGNOSIS — Z800 Family history of malignant neoplasm of digestive organs: Secondary | ICD-10-CM | POA: Diagnosis not present

## 2017-05-22 DIAGNOSIS — C50212 Malignant neoplasm of upper-inner quadrant of left female breast: Secondary | ICD-10-CM | POA: Diagnosis not present

## 2017-05-22 DIAGNOSIS — Z87891 Personal history of nicotine dependence: Secondary | ICD-10-CM | POA: Diagnosis not present

## 2017-05-22 DIAGNOSIS — Z7982 Long term (current) use of aspirin: Secondary | ICD-10-CM | POA: Diagnosis not present

## 2017-05-22 DIAGNOSIS — Z9981 Dependence on supplemental oxygen: Secondary | ICD-10-CM | POA: Diagnosis not present

## 2017-05-22 DIAGNOSIS — Z79899 Other long term (current) drug therapy: Secondary | ICD-10-CM | POA: Diagnosis not present

## 2017-05-22 DIAGNOSIS — C50912 Malignant neoplasm of unspecified site of left female breast: Secondary | ICD-10-CM | POA: Diagnosis present

## 2017-05-22 DIAGNOSIS — I10 Essential (primary) hypertension: Secondary | ICD-10-CM | POA: Diagnosis not present

## 2017-05-22 HISTORY — DX: Malignant (primary) neoplasm, unspecified: C80.1

## 2017-05-22 HISTORY — DX: Dyspnea, unspecified: R06.00

## 2017-05-22 HISTORY — DX: Chronic obstructive pulmonary disease, unspecified: J44.9

## 2017-05-22 HISTORY — DX: Pneumonia, unspecified organism: J18.9

## 2017-05-22 HISTORY — DX: Inflammatory liver disease, unspecified: K75.9

## 2017-05-22 LAB — COMPREHENSIVE METABOLIC PANEL
ALT: 17 U/L (ref 14–54)
AST: 16 U/L (ref 15–41)
Albumin: 4 g/dL (ref 3.5–5.0)
Alkaline Phosphatase: 64 U/L (ref 38–126)
Anion gap: 10 (ref 5–15)
BUN: 16 mg/dL (ref 6–20)
CALCIUM: 9.4 mg/dL (ref 8.9–10.3)
CHLORIDE: 103 mmol/L (ref 101–111)
CO2: 27 mmol/L (ref 22–32)
CREATININE: 0.74 mg/dL (ref 0.44–1.00)
GFR calc non Af Amer: >60 mL/min (ref >60)
GLUCOSE: 110 mg/dL — AB (ref 65–99)
POTASSIUM: 4.1 mmol/L (ref 3.5–5.1)
Sodium: 140 mmol/L (ref 135–145)
Total Bilirubin: 0.6 mg/dL (ref 0.3–1.2)
Total Protein: 6.6 g/dL (ref 6.5–8.1)

## 2017-05-22 LAB — PROTIME-INR
INR: 0.93
Prothrombin Time: 12.3 seconds (ref 11.4–15.2)

## 2017-05-22 LAB — CBC
HCT: 43.2 % (ref 36.0–46.0)
Hemoglobin: 14.4 g/dL (ref 12.0–15.0)
MCH: 28 pg (ref 26.0–34.0)
MCHC: 33.3 g/dL (ref 30.0–36.0)
MCV: 84 fL (ref 78.0–100.0)
PLATELETS: 259 10*3/uL (ref 150–400)
RBC: 5.14 MIL/uL — ABNORMAL HIGH (ref 3.87–5.11)
RDW: 13.3 % (ref 11.5–15.5)
WBC: 8.4 10*3/uL (ref 4.0–10.5)

## 2017-05-22 MED ORDER — ENSURE PRE-SURGERY PO LIQD: 296.0000 mL | Freq: Once | ORAL | Status: DC

## 2017-05-22 NOTE — Pre-Procedure Instructions (Signed)
Quinta Eimer  05/22/2017      [id number] [address], [place] [address] Phone: [phone] Fax: [phone]    Your procedure is scheduled on  Thursday 05/24/17  Report to [facility] Admitting at 1230 P.M.  Call this number if you have problems the morning of surgery:  [phone]   Remember:  Do not eat food or drink liquids after midnight.  ONLY CLEAR LIQUIDS AFTER MIDNIGHT. NOTHING 3 HOURS PRIOR TO SURGERY (1130AM).   DRINK ENSURE PRE- SURGERY  BY 1130 AM (3 HOURS PRIOR TO SURGERY).   Take these medicines the morning of surgery with A SIP OF WATER- ALBUTEROL INHALER IF NEEDED (BRING WITH YOU)                   7 days prior to surgery STOP taking any Aspirin(unless otherwise instructed by your surgeon), Aleve, Naproxen, Ibuprofen, Motrin, Advil, Goody's, BC's, all herbal medications, fish oil, and all vitamins   Do not wear jewelry, make-up or nail polish.  Do not wear lotions, powders, or perfumes, or deodorant.  Do not shave 48 hours prior to surgery.  Men may shave face and neck.  Do not bring valuables to the hospital.  [facility] is not responsible for any belongings or valuables.  Contacts, dentures or bridgework may not be worn into surgery.  Leave your suitcase in the car.  After surgery it may be brought to your room.  For patients admitted to the hospital, discharge time will be determined by your treatment team.  Patients discharged the day of surgery will not be allowed to drive home.   Name and phone number of your driver:   Special instructions:  [facility] - Preparing for Surgery  Before surgery, you can play an important role.  Because skin is not sterile, your skin needs to be as free of germs as possible.  You can reduce the number of germs on you skin by washing with CHG (chlorahexidine gluconate) soap before surgery.  CHG is an antiseptic cleaner which kills germs and bonds  with the skin to continue killing germs even after washing.  Please DO NOT use if you have an allergy to CHG or antibacterial soaps.  If your skin becomes reddened/irritated stop using the CHG and inform your nurse when you arrive at Short Stay.  Do not shave (including legs and underarms) for at least 48 hours prior to the first CHG shower.  You may shave your face.  Please follow these instructions carefully:   1.  Shower with CHG Soap the night before surgery and the                                morning of Surgery.  2.  If you choose to wash your hair, wash your hair first as usual with your       normal shampoo.  3.  After you shampoo, rinse your hair and body thoroughly to remove the                      Shampoo.  4.  Use CHG as you would any other liquid soap.  You can apply chg directly       to the skin and wash gently with scrungie or a clean washcloth.  5.  Apply the CHG Soap to your body ONLY  FROM THE NECK DOWN.        Do not use on open wounds or open sores.  Avoid contact with your eyes,       ears, mouth and genitals (private parts).  Wash genitals (private parts)       with your normal soap.  6.  Wash thoroughly, paying special attention to the area where your surgery        will be performed.  7.  Thoroughly rinse your body with warm water from the neck down.  8.  DO NOT shower/wash with your normal soap after using and rinsing off       the CHG Soap.  9.  Pat yourself dry with a clean towel.            10.  Wear clean pajamas.            11.  Place clean sheets on your bed the night of your first shower and do not        sleep with pets.  Day of Surgery  Do not apply any lotions/deoderants the morning of surgery.  Please wear clean clothes to the hospital/surgery center.     Please read over the following fact sheets that you were given. Pain Booklet

## 2017-05-24 ENCOUNTER — Ambulatory Visit (HOSPITAL_COMMUNITY)
Admission: RE | Admit: 2017-05-24 | Discharge: 2017-05-24 | Disposition: A | Discharge status: Home / Self Care | Payer: Medicare HMO | Source: Ambulatory Visit | Attending: General Surgery | Admitting: General Surgery

## 2017-05-24 ENCOUNTER — Ambulatory Visit (HOSPITAL_COMMUNITY): Payer: Medicare HMO | Admitting: Anesthesiology

## 2017-05-24 ENCOUNTER — Ambulatory Visit
Admission: RE | Admit: 2017-05-24 | Discharge: 2017-05-24 | Disposition: A | Discharge status: Home / Self Care | Payer: Medicare HMO | Source: Ambulatory Visit | Attending: General Surgery | Admitting: General Surgery

## 2017-05-24 ENCOUNTER — Encounter (HOSPITAL_COMMUNITY)
Admission: RE | Disposition: A | Discharge status: Home / Self Care | Payer: Self-pay | Source: Ambulatory Visit | Attending: General Surgery

## 2017-05-24 ENCOUNTER — Encounter (HOSPITAL_COMMUNITY): Payer: Self-pay | Admitting: Certified Registered"

## 2017-05-24 DIAGNOSIS — C50912 Malignant neoplasm of unspecified site of left female breast: Secondary | ICD-10-CM | POA: Diagnosis not present

## 2017-05-24 DIAGNOSIS — Z170 Estrogen receptor positive status [ER+]: Secondary | ICD-10-CM | POA: Insufficient documentation

## 2017-05-24 DIAGNOSIS — C50412 Malignant neoplasm of upper-outer quadrant of left female breast: Secondary | ICD-10-CM

## 2017-05-24 DIAGNOSIS — Z87891 Personal history of nicotine dependence: Secondary | ICD-10-CM | POA: Diagnosis not present

## 2017-05-24 DIAGNOSIS — I10 Essential (primary) hypertension: Secondary | ICD-10-CM | POA: Insufficient documentation

## 2017-05-24 DIAGNOSIS — Z9981 Dependence on supplemental oxygen: Secondary | ICD-10-CM | POA: Insufficient documentation

## 2017-05-24 DIAGNOSIS — Z800 Family history of malignant neoplasm of digestive organs: Secondary | ICD-10-CM | POA: Diagnosis not present

## 2017-05-24 DIAGNOSIS — Z7982 Long term (current) use of aspirin: Secondary | ICD-10-CM | POA: Insufficient documentation

## 2017-05-24 DIAGNOSIS — R928 Other abnormal and inconclusive findings on diagnostic imaging of breast: Secondary | ICD-10-CM | POA: Diagnosis not present

## 2017-05-24 DIAGNOSIS — Z79899 Other long term (current) drug therapy: Secondary | ICD-10-CM | POA: Insufficient documentation

## 2017-05-24 DIAGNOSIS — J449 Chronic obstructive pulmonary disease, unspecified: Secondary | ICD-10-CM | POA: Diagnosis not present

## 2017-05-24 DIAGNOSIS — C50212 Malignant neoplasm of upper-inner quadrant of left female breast: Secondary | ICD-10-CM | POA: Diagnosis not present

## 2017-05-24 DIAGNOSIS — E785 Hyperlipidemia, unspecified: Secondary | ICD-10-CM | POA: Diagnosis not present

## 2017-05-24 HISTORY — PX: BREAST LUMPECTOMY WITH RADIOACTIVE SEED LOCALIZATION: SHX6424

## 2017-05-24 SURGERY — BREAST LUMPECTOMY WITH RADIOACTIVE SEED LOCALIZATION
Anesthesia: General | Site: Breast | Laterality: Left

## 2017-05-24 MED ORDER — EPHEDRINE SULFATE-NACL 50-0.9 MG/10ML-% IV SOSY
PREFILLED_SYRINGE | INTRAVENOUS | Status: DC | PRN
  Administered 2017-05-24: 5 mg via INTRAVENOUS
  Administered 2017-05-24 (×3): 10 mg via INTRAVENOUS

## 2017-05-24 MED ORDER — FENTANYL CITRATE (PF) 100 MCG/2ML IJ SOLN: 25.0000 ug | INTRAMUSCULAR | Status: DC | PRN

## 2017-05-24 MED ORDER — MIDAZOLAM HCL 5 MG/5ML IJ SOLN
INTRAMUSCULAR | Status: DC | PRN
  Administered 2017-05-24: 2 mg via INTRAVENOUS

## 2017-05-24 MED ORDER — KETOROLAC TROMETHAMINE 15 MG/ML IJ SOLN
INTRAMUSCULAR | Status: DC | PRN
  Administered 2017-05-24: 15 mg via INTRAVENOUS

## 2017-05-24 MED ORDER — LIDOCAINE 2% (20 MG/ML) 5 ML SYRINGE
INTRAMUSCULAR | Status: DC | PRN
  Administered 2017-05-24: 80 mg via INTRAVENOUS

## 2017-05-24 MED ORDER — ONDANSETRON HCL 4 MG/2ML IJ SOLN
INTRAMUSCULAR | Status: AC
  Filled 2017-05-24: qty 2

## 2017-05-24 MED ORDER — ONDANSETRON HCL 4 MG/2ML IJ SOLN
INTRAMUSCULAR | Status: DC | PRN
  Administered 2017-05-24: 4 mg via INTRAVENOUS

## 2017-05-24 MED ORDER — PROPOFOL 10 MG/ML IV BOLUS
INTRAVENOUS | Status: DC | PRN
  Administered 2017-05-24: 140 mg via INTRAVENOUS

## 2017-05-24 MED ORDER — DEXAMETHASONE SODIUM PHOSPHATE 10 MG/ML IJ SOLN
INTRAMUSCULAR | Status: DC | PRN
  Administered 2017-05-24: 5 mg via INTRAVENOUS

## 2017-05-24 MED ORDER — CIPROFLOXACIN IN D5W 400 MG/200ML IV SOLN
400.0000 mg | INTRAVENOUS | Status: AC
  Administered 2017-05-24: 400 mg via INTRAVENOUS

## 2017-05-24 MED ORDER — ACETAMINOPHEN 500 MG PO TABS
ORAL_TABLET | ORAL | Status: AC
  Administered 2017-05-24: 1000 mg
  Filled 2017-05-24: qty 2

## 2017-05-24 MED ORDER — ONDANSETRON HCL 4 MG/2ML IJ SOLN
4.0000 mg | Freq: Once | INTRAMUSCULAR | Status: AC
Start: 2017-05-24 — End: 2017-05-24
  Administered 2017-05-24: 4 mg via INTRAVENOUS

## 2017-05-24 MED ORDER — FENTANYL CITRATE (PF) 250 MCG/5ML IJ SOLN
INTRAMUSCULAR | Status: AC
  Filled 2017-05-24: qty 5

## 2017-05-24 MED ORDER — LACTATED RINGERS IV SOLN
INTRAVENOUS | Status: DC
  Administered 2017-05-24: 50 mL/h via INTRAVENOUS

## 2017-05-24 MED ORDER — PHENYLEPHRINE 40 MCG/ML (10ML) SYRINGE FOR IV PUSH (FOR BLOOD PRESSURE SUPPORT)
PREFILLED_SYRINGE | INTRAVENOUS | Status: DC | PRN
  Administered 2017-05-24: 80 ug via INTRAVENOUS
  Administered 2017-05-24 (×2): 40 ug via INTRAVENOUS

## 2017-05-24 MED ORDER — MEPERIDINE HCL 50 MG/ML IJ SOLN: 6.2500 mg | INTRAMUSCULAR | Status: DC | PRN

## 2017-05-24 MED ORDER — FENTANYL CITRATE (PF) 100 MCG/2ML IJ SOLN
INTRAMUSCULAR | Status: DC | PRN
  Administered 2017-05-24 (×2): 50 ug via INTRAVENOUS

## 2017-05-24 MED ORDER — DEXAMETHASONE SODIUM PHOSPHATE 10 MG/ML IJ SOLN
INTRAMUSCULAR | Status: AC
  Filled 2017-05-24: qty 1

## 2017-05-24 MED ORDER — MIDAZOLAM HCL 2 MG/2ML IJ SOLN
INTRAMUSCULAR | Status: AC
  Filled 2017-05-24: qty 2

## 2017-05-24 MED ORDER — PROPOFOL 10 MG/ML IV BOLUS
INTRAVENOUS | Status: AC
  Filled 2017-05-24: qty 20

## 2017-05-24 MED ORDER — KETOROLAC TROMETHAMINE 30 MG/ML IJ SOLN
INTRAMUSCULAR | Status: AC
  Filled 2017-05-24: qty 1

## 2017-05-24 MED ORDER — 0.9 % SODIUM CHLORIDE (POUR BTL) OPTIME: TOPICAL | Status: DC | PRN

## 2017-05-24 MED ORDER — GABAPENTIN 100 MG PO CAPS
100.0000 mg | ORAL_CAPSULE | ORAL | Status: AC
  Administered 2017-05-24: 100 mg via ORAL
  Filled 2017-05-24 (×2): qty 1

## 2017-05-24 MED ORDER — ACETAMINOPHEN 500 MG PO TABS
1000.0000 mg | ORAL_TABLET | ORAL | Status: DC
Start: 2017-05-25 — End: 2017-05-24

## 2017-05-24 MED ORDER — BUPIVACAINE-EPINEPHRINE 0.25% -1:200000 IJ SOLN
INTRAMUSCULAR | Status: DC | PRN
  Administered 2017-05-24: 10 mL

## 2017-05-24 MED ORDER — BUPIVACAINE-EPINEPHRINE (PF) 0.25% -1:200000 IJ SOLN
INTRAMUSCULAR | Status: AC
  Filled 2017-05-24: qty 30

## 2017-05-24 MED ORDER — CIPROFLOXACIN IN D5W 400 MG/200ML IV SOLN
INTRAVENOUS | Status: AC
  Filled 2017-05-24: qty 200

## 2017-05-24 SURGICAL SUPPLY — 48 items
ADH SKN CLS APL DERMABOND .7 (GAUZE/BANDAGES/DRESSINGS) ×1
APPLIER CLIP 9.375 MED OPEN (MISCELLANEOUS)
APR CLP MED 9.3 20 MLT OPN (MISCELLANEOUS)
BINDER BREAST LRG (GAUZE/BANDAGES/DRESSINGS) IMPLANT
BINDER BREAST XLRG (GAUZE/BANDAGES/DRESSINGS) ×1 IMPLANT
BLADE SURG 15 STRL LF DISP TIS (BLADE) ×1 IMPLANT
BLADE SURG 15 STRL SS (BLADE) ×2
CANISTER SUCT 3000ML PPV (MISCELLANEOUS) ×2 IMPLANT
CHLORAPREP W/TINT 26ML (MISCELLANEOUS) ×2 IMPLANT
CLIP APPLIE 9.375 MED OPEN (MISCELLANEOUS) IMPLANT
CLSR STERI-STRIP ANTIMIC 1/2X4 (GAUZE/BANDAGES/DRESSINGS) ×1 IMPLANT
COVER PROBE W GEL 5X96 (DRAPES) ×2 IMPLANT
COVER SURGICAL LIGHT HANDLE (MISCELLANEOUS) ×2 IMPLANT
DERMABOND ADVANCED (GAUZE/BANDAGES/DRESSINGS) ×1
DERMABOND ADVANCED .7 DNX12 (GAUZE/BANDAGES/DRESSINGS) ×1 IMPLANT
DEVICE DUBIN SPECIMEN MAMMOGRA (MISCELLANEOUS) ×2 IMPLANT
DRAPE CHEST BREAST 15X10 FENES (DRAPES) ×2 IMPLANT
DRAPE UTILITY XL STRL (DRAPES) ×2 IMPLANT
ELECT COATED BLADE 2.86 ST (ELECTRODE) ×2 IMPLANT
ELECT REM PT RETURN 9FT ADLT (ELECTROSURGICAL) ×2
ELECTRODE REM PT RTRN 9FT ADLT (ELECTROSURGICAL) ×1 IMPLANT
GLOVE BIO SURGEON STRL SZ7 (GLOVE) ×4 IMPLANT
GLOVE BIOGEL PI IND STRL 7.5 (GLOVE) ×1 IMPLANT
GLOVE BIOGEL PI INDICATOR 7.5 (GLOVE) ×1
GOWN STRL REUS W/ TWL LRG LVL3 (GOWN DISPOSABLE) ×2 IMPLANT
GOWN STRL REUS W/TWL LRG LVL3 (GOWN DISPOSABLE) ×4
ILLUMINATOR WAVEGUIDE N/F (MISCELLANEOUS) ×2 IMPLANT
KIT BASIN OR (CUSTOM PROCEDURE TRAY) ×2 IMPLANT
KIT MARKER MARGIN INK (KITS) ×2 IMPLANT
NDL HYPO 25GX1X1/2 BEV (NEEDLE) ×1 IMPLANT
NEEDLE HYPO 25GX1X1/2 BEV (NEEDLE) ×2 IMPLANT
NS IRRIG 1000ML POUR BTL (IV SOLUTION) ×2 IMPLANT
PACK SURGICAL SETUP 50X90 (CUSTOM PROCEDURE TRAY) ×2 IMPLANT
PENCIL BUTTON HOLSTER BLD 10FT (ELECTRODE) ×2 IMPLANT
SPONGE LAP 18X18 X RAY DECT (DISPOSABLE) ×2 IMPLANT
STRIP CLOSURE SKIN 1/2X4 (GAUZE/BANDAGES/DRESSINGS) ×2 IMPLANT
SUT MNCRL AB 4-0 PS2 18 (SUTURE) ×2 IMPLANT
SUT SILK 2 0 SH (SUTURE) IMPLANT
SUT VIC AB 2-0 SH 27 (SUTURE) ×2
SUT VIC AB 2-0 SH 27XBRD (SUTURE) ×1 IMPLANT
SUT VIC AB 3-0 SH 27 (SUTURE) ×2
SUT VIC AB 3-0 SH 27X BRD (SUTURE) ×1 IMPLANT
SYR BULB 3OZ (MISCELLANEOUS) ×2 IMPLANT
SYR CONTROL 10ML LL (SYRINGE) ×2 IMPLANT
TOWEL OR 17X24 6PK STRL BLUE (TOWEL DISPOSABLE) ×2 IMPLANT
TOWEL OR 17X26 10 PK STRL BLUE (TOWEL DISPOSABLE) ×2 IMPLANT
TUBE CONNECTING 12X1/4 (SUCTIONS) ×2 IMPLANT
YANKAUER SUCT BULB TIP NO VENT (SUCTIONS) ×2 IMPLANT

## 2017-05-24 NOTE — Transfer of Care (Signed)
Immediate Anesthesia Transfer of Care Note  Patient: Cassandra James  Procedure(s) Performed: BREAST LUMPECTOMY WITH RADIOACTIVE SEED LOCALIZATION (Left Breast)  Patient Location: PACU  Anesthesia Type:General  Level of Consciousness: awake, alert  and oriented  Airway & Oxygen Therapy: Patient Spontanous Breathing and Patient connected to face mask oxygen  Post-op Assessment: Report given to RN and Post -op Vital signs reviewed and stable  Post vital signs: Reviewed and stable  Last Vitals:  Vitals Value Taken Time  BP 108/56 05/24/2017  2:59 PM  Temp 36.4 C 05/24/2017  3:00 PM  Pulse 69 05/24/2017  3:04 PM  Resp 13 05/24/2017  3:04 PM  SpO2 94 % 05/24/2017  3:04 PM  Vitals shown include unvalidated device data.  Last Pain:  Vitals:   05/24/17 1500  TempSrc:   PainSc: 0-No pain      Patients Stated Pain Goal: 3 (47/99/87 2158)  Complications: No apparent anesthesia complications

## 2017-05-24 NOTE — Op Note (Addendum)
Preoperative diagnosis: clinical stage I left breast cancer Postoperative diagnosis: same as above Procedure:Left breast seed guided lumpectomy Surgeon: Dr Serita Grammes [id number] Anes: general  Specimens  1.leftbreast tissue marked with paint 2. Additional posterior and superior margins marked short superior, long lateral double deep Complications none Drains none Sponge count correct Dispo to pacu stable  Indications: This is a24 yof with clinical stage I left breast cancer. We have discussed options and have elected to proceed with lumpectomy alone.  A seed was placed in the breast mass prior to beginning.   Procedure: After informed consent was obtained the patient was taken to the operating room.  She was given antibiotics. Sequential compression devices were on her legs. She was then placed under general anesthesia with an LMA. Then she was prepped and draped in the standard sterile surgical fashion. Surgical timeout was then performed.  I then located the seed in the upper central left breastI infiltrated marcaine and then made a periareolar incision to hide the scar. I then used the neoprobe to remove the seed and the surrounding tissue with attempt to get clear margins. I marked this with paint. MM confirmed removal of seed and theclip.I did remove additional posterior and superior  margins as the seed was close to that.the posterior margin is muscle. I placed clips in the cavity.I then obtained hemostasis. This was marked as above.I closed with with 2-0 vicryl to approximate breast tissue. The skin was closed with 3-0 vicryl and 5-0 monocryl. Glue and steristrips were placed. She was extubated and transferred to pacu stable.

## 2017-05-24 NOTE — H&P (Signed)
72 yof referred by Dr Maudie Mercury for new left breast cancer. she is here with her husband who is a retired Doctor, general practice. she is otherwise healthy. she has no personal history of breast disease. no mass or dc. she has no fh. she had screening mm that shows b density breasts. in Left uiq she has a 4x3x4 mm mass. the axilla is negative by [place]. 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 she is here to discuss options.   Past Surgical History Tawni Pummel, RN; 05/16/2017 7:31 AM) Breast Biopsy  Left. Cataract Surgery  Bilateral. Hip Surgery  Right. Oral Surgery   Diagnostic Studies History Tawni Pummel, RN; 05/16/2017 7:31 AM) Colonoscopy  1-5 years ago Mammogram  within last year Pap Smear  1-5 years ago  Allergies Rolm Bookbinder, MD; 05/17/2017 8:34 AM) Cephalexin *CEPHALOSPORINS*  Anaphylaxis. Codeine Phosphate *ANALGESICS - OPIOID*  Anaphylaxis.  Medication History Rolm Bookbinder, MD; 05/17/2017 8:34 AM) Medications Reconciled Albuterol (90MCG/ACT Aerosol Soln, Inhalation) Active. Aspirin (81MG  Tablet DR, Oral) Active. Cholecalciferol (1000UNIT Capsule, Oral) Active. Cyanocobalamin (1000MCG/ML Solution, Injection) Active. HydroDiuril (25MG  Tablet, Oral) Active. Hyoscyamine Sulfate (0.125MG  Tablet Disint, Oral) Active. Potassium Chloride (20MEQ Tablet ER, Oral) Active. Crestor (10MG  Tablet, Oral) Active. Micardis (40MG  Tablet, Oral) Active.  Social History Tawni Pummel, RN; 05/16/2017 7:31 AM) Alcohol use  Occasional alcohol use. Caffeine use  Coffee. No drug use  Tobacco use  Former smoker.  Family History Tawni Pummel, RN; 05/16/2017 7:31 AM) Alcohol Abuse  Family Members In General. Arthritis  Family Members In General. Bleeding disorder  Mother. Cerebrovascular Accident  Father. Colon Cancer  Sister. Diabetes Mellitus  Family Members In General. Heart Disease  Sister. Hypertension   Father, Sister. Malignant Neoplasm Of Pancreas  Family Members In General. Respiratory Condition  Father. Thyroid problems  Mother.  Pregnancy / Birth History Tawni Pummel, RN; 05/16/2017 7:31 AM) Age at menarche  47 years. Age of menopause  <43 Contraceptive History  Intrauterine device, Oral contraceptives. Gravida  5 Irregular periods  Maternal age  13-25 Para  58  Other Problems Tawni Pummel, RN; 05/16/2017 7:31 AM) Breast Cancer  Chronic Obstructive Lung Disease  Diverticulosis  Hepatitis  High blood pressure  Home Oxygen Use  Lump In Breast    Review of Systems Sunday Spillers Ledford RN; 05/16/2017 7:31 AM) General Not Present- Appetite Loss, Chills, Fatigue, Fever, Night Sweats, Weight Gain and Weight Loss. Skin Not Present- Change in Wart/Mole, Dryness, Hives, Jaundice, New Lesions, Non-Healing Wounds, Rash and Ulcer. HEENT Present- Ringing in the Ears, Seasonal Allergies and Wears glasses/contact lenses. Not Present- Earache, Hearing Loss, Hoarseness, Nose Bleed, Oral Ulcers, Sinus Pain, Sore Throat, Visual Disturbances and Yellow Eyes. Respiratory Present- Difficulty Breathing and Wheezing. Not Present- Bloody sputum, Chronic Cough and Snoring. Breast Not Present- Breast Mass, Breast Pain, Nipple Discharge and Skin Changes. Cardiovascular Present- Leg Cramps. Not Present- Chest Pain, Difficulty Breathing Lying Down, Palpitations, Rapid Heart Rate, Shortness of Breath and Swelling of Extremities. Gastrointestinal Present- Abdominal Pain. Not Present- Bloating, Bloody Stool, Change in Bowel Habits, Chronic diarrhea, Constipation, Difficulty Swallowing, Excessive gas, Gets full quickly at meals, Hemorrhoids, Indigestion, Nausea, Rectal Pain and Vomiting. Female Genitourinary Present- Urgency. Not Present- Frequency, Nocturia, Painful Urination and Pelvic Pain. Musculoskeletal Present- Muscle Weakness. Not Present- Back Pain, Joint Pain, Joint Stiffness, Muscle Pain and  Swelling of Extremities. Neurological Not Present- Decreased Memory, Fainting, Headaches, Numbness, Seizures, Tingling, Tremor, Trouble walking and Weakness. Psychiatric Not Present-  Anxiety, Bipolar, Change in Sleep Pattern, Depression, Fearful and Frequent crying. Endocrine Not Present- Cold Intolerance, Excessive Hunger, Hair Changes, Heat Intolerance, Hot flashes and New Diabetes. Hematology Not Present- Blood Thinners, Easy Bruising, Excessive bleeding, Gland problems, HIV and Persistent Infections.   Physical Exam Rolm Bookbinder MD; 05/17/2017 8:31 AM) General Mental Status-Alert. Head and Neck Trachea-midline. Thyroid Gland Characteristics - normal size and consistency. Eye Sclera/Conjunctiva - Bilateral-No scleral icterus. Chest and Lung Exam Chest and lung exam reveals -quiet, even and easy respiratory effort with no use of accessory muscles and on auscultation, normal breath sounds, no adventitious sounds and normal vocal resonance. Breast Nipples-No Discharge. Breast Lump-No Palpable Breast Mass. Cardiovascular Cardiovascular examination reveals -normal heart sounds, regular rate and rhythm with no murmurs. Abdomen Note: soft no hepatomegaly Neurologic Neurologic evaluation reveals -alert and oriented x 3 with no impairment of recent or remote memory. Lymphatic Head & Neck General Head & Neck Lymphatics: Bilateral - Description - Normal. Axillary General Axillary Region: Bilateral - Description - Normal. 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 We discussed all of the different options for treatment for breast cancer including surgery, chemotherapy, radiation therapy, Herceptin, and antiestrogen therapy. Due to small tumor, age and  er pos I dont think she needs sn biopsy based on available data.this was agreeable to all in the [facility] also as well as the patient after discussion. We discussed the options for treatment of the breast cancer which included lumpectomy versus a mastectomy. We discussed the performance of the lumpectomy with radioactive seed placement. We discussed a 5-10% chance of a positive margin requiring reexcision in the operating room. We also discussed that she could possibly need radiation therapy if she undergoes lumpectomy. We discussed mastectomy and the postoperative care for that as well. Mastectomy can be followed by reconstruction. This is a more extensive surgery and requires more recovery. The decision for lumpectomy vs mastectomy has no impact on decision for chemotherapy. Most mastectomy patients will not need radiation therapy. We discussed that there is no difference in her survival whether she undergoes lumpectomy with radiation therapy or antiestrogen therapy versus a mastectomy. There is also no real difference between her recurrence in the breast. We discussed the risks of operation including bleeding, infection, possible reoperation. She understands her further therapy will be based on what her stages at the time of her operation.

## 2017-05-24 NOTE — Anesthesia Procedure Notes (Signed)
Procedure Name: LMA Insertion Date/Time: 05/24/2017 2:06 PM Performed by: Gwyndolyn Saxon, CRNA Pre-anesthesia Checklist: Patient identified, Emergency Drugs available, Suction available, Patient being monitored and Timeout performed Patient Re-evaluated:Patient Re-evaluated prior to induction Oxygen Delivery Method: Circle system utilized Preoxygenation: Pre-oxygenation with 100% oxygen Induction Type: IV induction Ventilation: Mask ventilation without difficulty LMA: LMA inserted LMA Size: 4.0 Number of attempts: 1 Placement Confirmation: positive ETCO2,  CO2 detector and breath sounds checked- equal and bilateral Tube secured with: Tape Dental Injury: Teeth and Oropharynx as per pre-operative assessment

## 2017-05-24 NOTE — Anesthesia Preprocedure Evaluation (Addendum)
Anesthesia Evaluation  Patient identified by MRN, date of birth, ID band Patient awake    Reviewed: Allergy & Precautions, H&P , NPO status , Patient's Chart, lab work & pertinent test results, reviewed documented beta blocker date and time   Airway Mallampati: II  TM Distance: >3 FB Neck ROM: full    Dental no notable dental hx.    Pulmonary neg pulmonary ROS, former smoker,    Pulmonary exam normal breath sounds clear to auscultation       Cardiovascular Exercise Tolerance: Good hypertension, Pt. on medications negative cardio ROS   Rhythm:regular Rate:Normal     Neuro/Psych negative neurological ROS  negative psych ROS   GI/Hepatic negative GI ROS,   Endo/Other  negative endocrine ROS  Renal/GU negative Renal ROS  negative genitourinary   Musculoskeletal   Abdominal   Peds  Hematology negative hematology ROS (+)   Anesthesia Other Findings   Reproductive/Obstetrics negative OB ROS                            Anesthesia Physical Anesthesia Plan  ASA: II  Anesthesia Plan: General   Post-op Pain Management:    Induction: Intravenous  PONV Risk Score and Plan: 3 and Ondansetron, Dexamethasone and Treatment may vary due to age or medical condition  Airway Management Planned: LMA  Additional Equipment:   Intra-op Plan:   Post-operative Plan:   Informed Consent: I have reviewed the patients History and Physical, chart, labs and discussed the procedure including the risks, benefits and alternatives for the proposed anesthesia with the patient or authorized representative who has indicated his/her understanding and acceptance.   Dental Advisory Given  Plan Discussed with: CRNA, Anesthesiologist and Surgeon  Anesthesia Plan Comments: ( )        Anesthesia Quick Evaluation

## 2017-05-24 NOTE — Interval H&P Note (Signed)
History and Physical Interval Note:  05/24/2017 1:47 PM  Cassandra James  has presented today for surgery, with the diagnosis of LEFT BREAST CANCER  The various methods of treatment have been discussed with the patient and family. After consideration of risks, benefits and other options for treatment, the patient has consented to  Procedure(s): BREAST LUMPECTOMY WITH RADIOACTIVE SEED LOCALIZATION (Left) as a surgical intervention .  The patient's history has been reviewed, patient examined, no change in status, stable for surgery.  I have reviewed the patient's chart and labs.  Questions were answered to the patient's satisfaction.     Rolm Bookbinder

## 2017-05-24 NOTE — Discharge Instructions (Signed)
[facility] °Office Phone Number [phone] °POST OP INSTRUCTIONS ° °Always review your discharge instruction sheet given to you by the facility where your surgery was performed. ° °IF YOU HAVE DISABILITY OR FAMILY LEAVE FORMS, YOU MUST BRING THEM TO THE OFFICE FOR PROCESSING.  DO NOT GIVE THEM TO YOUR DOCTOR. ° °1. A prescription for pain medication may be given to you upon discharge.  Take your pain medication as prescribed, if needed.  If narcotic pain medicine is not needed, then you may take acetaminophen (Tylenol), naprosyn (Alleve) or ibuprofen (Advil) as needed. °2. Take your usually prescribed medications unless otherwise directed °3. If you need a refill on your pain medication, please contact your pharmacy.  They will contact our office to request authorization.  Prescriptions will not be filled after 5pm or on week-ends. °4. You should eat very light the first 24 hours after surgery, such as soup, crackers, pudding, etc.  Resume your normal diet the day after surgery. °5. Most patients will experience some swelling and bruising in the breast.  Ice packs and a good support bra will help.  Wear the breast binder provided or a sports bra for 72 hours day and night.  After that wear a sports bra during the day until you return to the office. Swelling and bruising can take several days to resolve.  °6. It is common to experience some constipation if taking pain medication after surgery.  Increasing fluid intake and taking a stool softener will usually help or prevent this problem from occurring.  A mild laxative (Milk of Magnesia or Miralax) should be taken according to package directions if there are no bowel movements after 48 hours. °7. Unless discharge instructions indicate otherwise, you may remove your bandages 48 hours after surgery and you may shower at that time.  You may have steri-strips (small skin tapes) in place directly over the incision.  These strips should be left on the  skin for 7-10 days and will come off on their own.  If your surgeon used skin glue on the incision, you may shower in 24 hours.  The glue will flake off over the next 2-3 weeks.  Any sutures or staples will be removed at the office during your follow-up visit. °8. ACTIVITIES:  You may resume regular daily activities (gradually increasing) beginning the next day.  Wearing a good support bra or sports bra minimizes pain and swelling.  You may have sexual intercourse when it is comfortable. °a. You may drive when you no longer are taking prescription pain medication, you can comfortably wear a seatbelt, and you can safely maneuver your car and apply brakes. °b. RETURN TO WORK:  ______________________________________________________________________________________ °9. You should see your doctor in the office for a follow-up appointment approximately two weeks after your surgery.  Your doctor’s nurse will typically make your follow-up appointment when she calls you with your pathology report.  Expect your pathology report 3-4 business days after your surgery.  You may call to check if you do not hear from us after three days. °10. OTHER INSTRUCTIONS: _______________________________________________________________________________________________ _____________________________________________________________________________________________________________________________________ °_____________________________________________________________________________________________________________________________________ °_____________________________________________________________________________________________________________________________________ ° °WHEN TO CALL DR Rabab Currington: °1. Fever over 101.0 °2. Nausea and/or vomiting. °3. Extreme swelling or bruising. °4. Continued bleeding from incision. °5. Increased pain, redness, or drainage from the incision. ° °The clinic staff is available to answer your questions during regular  business hours.  Please don’t hesitate to call and ask to speak to one of the nurses for clinical concerns.  If you   have a medical emergency, go to the nearest emergency room or call 911.  A surgeon from [facility] is always on call at the hospital. ° °For further questions, please visit [email or web address] ° °

## 2017-05-24 NOTE — Anesthesia Postprocedure Evaluation (Signed)
Anesthesia Post Note  Patient: Cassandra James  Procedure(s) Performed: BREAST LUMPECTOMY WITH RADIOACTIVE SEED LOCALIZATION (Left Breast)     Patient location during evaluation: PACU Anesthesia Type: General Level of consciousness: awake and alert Pain management: pain level controlled Vital Signs Assessment: post-procedure vital signs reviewed and stable Respiratory status: spontaneous breathing, nonlabored ventilation, respiratory function stable and patient connected to nasal cannula oxygen Cardiovascular status: blood pressure returned to baseline and stable Postop Assessment: no apparent nausea or vomiting Anesthetic complications: no    Last Vitals:  Vitals:   05/24/17 1514 05/24/17 1515  BP: 107/62   Pulse: 73 72  Resp: 15 14  Temp:    SpO2: 96% 95%    Last Pain:  Vitals:   05/24/17 1500  TempSrc:   PainSc: 0-No pain                 Brooks Kinnan

## 2017-05-25 ENCOUNTER — Encounter (HOSPITAL_COMMUNITY): Payer: Self-pay | Admitting: General Surgery

## 2017-05-28 ENCOUNTER — Other Ambulatory Visit: Payer: Self-pay | Admitting: Family Medicine

## 2017-05-31 ENCOUNTER — Telehealth: Payer: Self-pay | Admitting: *Deleted

## 2017-05-31 ENCOUNTER — Inpatient Hospital Stay (HOSPITAL_BASED_OUTPATIENT_CLINIC_OR_DEPARTMENT_OTHER): Payer: Medicare HMO | Admitting: Hematology and Oncology

## 2017-05-31 DIAGNOSIS — C50212 Malignant neoplasm of upper-inner quadrant of left female breast: Secondary | ICD-10-CM | POA: Diagnosis not present

## 2017-05-31 DIAGNOSIS — Z170 Estrogen receptor positive status [ER+]: Secondary | ICD-10-CM | POA: Diagnosis not present

## 2017-05-31 NOTE — Progress Notes (Signed)
Patient Care Team: Lucretia Kern, DO as PCP - General (Family Medicine) Rolm Bookbinder, MD as Consulting Physician (General Surgery) Nicholas Lose, MD as Consulting Physician (Hematology and Oncology) Kyung Rudd, MD as Consulting Physician (Radiation Oncology)  DIAGNOSIS:  Encounter Diagnosis  Name Primary?  . Malignant neoplasm of upper-inner quadrant of left breast in female, estrogen receptor positive ([place])     SUMMARY OF ONCOLOGIC HISTORY:   Malignant neoplasm of upper-inner quadrant of left breast in female, estrogen receptor positive ([place])   05/04/2017 Initial Diagnosis    Screening detected left breast mass at 11:30 position: 0.4 cm, axilla negative, biopsy revealed grade 1 IDC ER 100%, PR 60%, Ki-67 10%, HER-2 negative ratio 1.4, T1 a N0 stage I a clinical stage      05/24/2017 Surgery    Left lumpectomy: IDC grade 1, 1.8 cm, resection margins negative, ER 100%, PR 60%, Ki-67 10%, HER-2 negative ratio 1.4, T1c N0 stage I a       CHIEF COMPLIANT: Follow-up after recent left lumpectomy  INTERVAL HISTORY: Cassandra James is a 71-year with above-mentioned his left breast cancer who underwent lumpectomy and is here to discuss pathology report.  Initial images revealed only 0.4 cm tumor but on the final pathology the tumor was 1.8 cm.  She is recovering very well from the recent surgery.  REVIEW OF SYSTEMS:   Constitutional: Denies fevers, chills or abnormal weight loss Eyes: Denies blurriness of vision Ears, nose, mouth, throat, and face: Denies mucositis or sore throat Respiratory: Denies cough, dyspnea or wheezes Cardiovascular: Denies palpitation, chest discomfort Gastrointestinal:  Denies nausea, heartburn or change in bowel habits Skin: Denies abnormal skin rashes Lymphatics: Denies new lymphadenopathy or easy bruising Neurological:Denies numbness, tingling or new weaknesses Behavioral/Psych: Mood is stable, no new changes  Extremities: No lower extremity  edema Breast: Recent left lumpectomy All other systems were reviewed with the patient and are negative.  I have reviewed the past medical history, past surgical history, social history and family history with the patient and they are unchanged from previous note.  ALLERGIES:  is allergic to cephalexin; cephalosporins; codeine; and tessalon [benzonatate].  MEDICATIONS:  Current Outpatient Medications  Medication Sig Dispense Refill  . albuterol (PROAIR HFA) 108 (90 Base) MCG/ACT inhaler 2 puffs every 4 hours as needed only  if your can't catch your breath (Patient taking differently: Inhale 1 puff into the lungs every 4 (four) hours as needed for wheezing or shortness of breath. ) 1 Inhaler 11  . cholecalciferol (VITAMIN D) 1000 units tablet Take 1,000 Units by mouth daily.    . cyanocobalamin (,VITAMIN B-12,) 1000 MCG/ML injection INJECT 1ML INTO THE MUSCLE EVERY 30 DAYS (Patient taking differently: Inject 1,000 mcg into the muscle every 30 (thirty) days. ) 12 mL 3  . hydrochlorothiazide (HYDRODIURIL) 25 MG tablet TAKE 1 TABLET BY MOUTH EVERY DAY 90 tablet 1  . hyoscyamine (LEVSIN SL) 0.125 MG SL tablet Place 2 tablets (0.25 mg total) under the tongue every 4 (four) hours as needed for cramping. 90 tablet 0  . naproxen sodium (ALEVE) 220 MG tablet Take 220 mg by mouth 2 (two) times daily as needed (for pain or headache).    . Omega-3 Fatty Acids (FISH OIL) 1200 MG CAPS Take 1,200 mg by mouth daily.    . potassium chloride SA (KLOR-CON M20) 20 MEQ tablet Take 1 tablet (20 mEq total) by mouth daily. 90 tablet 1  . rosuvastatin (CRESTOR) 10 MG tablet Take 1 tablet (10 mg total)  by mouth daily. 90 tablet 3  . telmisartan (MICARDIS) 40 MG tablet Take 1 tablet (40 mg total) by mouth daily. 90 tablet 1   No current facility-administered medications for this visit.     PHYSICAL EXAMINATION: ECOG PERFORMANCE STATUS: 1 - Symptomatic but completely ambulatory  Vitals:   05/31/17 1333  BP: (!)  131/91  Pulse: 64  Resp: 18  Temp: (!) 97.4 F (36.3 C)  SpO2: 99%   Filed Weights   05/31/17 1333  Weight: 177 lb 9.6 oz (80.6 kg)    GENERAL:alert, no distress and comfortable SKIN: skin color, texture, turgor are normal, no rashes or significant lesions EYES: normal, Conjunctiva are pink and non-injected, sclera clear OROPHARYNX:no exudate, no erythema and lips, buccal mucosa, and tongue normal  NECK: supple, thyroid normal size, non-tender, without nodularity LYMPH:  no palpable lymphadenopathy in the cervical, axillary or inguinal LUNGS: clear to auscultation and percussion with normal breathing effort HEART: regular rate & rhythm and no murmurs and no lower extremity edema ABDOMEN:abdomen soft, non-tender and normal bowel sounds MUSCULOSKELETAL:no cyanosis of digits and no clubbing  NEURO: alert & oriented x 3 with fluent speech, no focal motor/sensory deficits EXTREMITIES: No lower extremity edema  LABORATORY DATA:  I have reviewed the data as listed CMP Latest Ref Rng & Units 05/22/2017 03/27/2017 12/19/2016  Glucose 65 - 99 mg/dL 110(H) 93 122(H)  BUN 6 - 20 mg/dL 16 17 17   Creatinine 0.44 - 1.00 mg/dL 0.74 0.65 0.76  Sodium 135 - 145 mmol/L 140 140 140  Potassium 3.5 - 5.1 mmol/L 4.1 4.2 4.4  Chloride 101 - 111 mmol/L 103 99 101  CO2 22 - 32 mmol/L 27 36(H) 31  Calcium 8.9 - 10.3 mg/dL 9.4 9.9 9.3  Total Protein 6.5 - 8.1 g/dL 6.6 - -  Total Bilirubin 0.3 - 1.2 mg/dL 0.6 - -  Alkaline Phos 38 - 126 U/L 64 - -  AST 15 - 41 U/L 16 - -  ALT 14 - 54 U/L 17 - -    Lab Results  Component Value Date   WBC 8.4 05/22/2017   HGB 14.4 05/22/2017   HCT 43.2 05/22/2017   MCV 84.0 05/22/2017   PLT 259 05/22/2017   NEUTROABS 3.1 01/14/2016    ASSESSMENT & PLAN:  Malignant neoplasm of upper-inner quadrant of left breast in female, estrogen receptor positive ([place]) 05/24/2017: Left lumpectomy: IDC grade 1, 1.8 cm, resection margins negative, ER 100%, PR 60%, Ki-67 10%, HER-2  negative ratio 1.4, T1c N0 stage I a  Pathology counseling: I discussed the final pathology report of the patient provided  a copy of this report. I discussed the margins as well as lymph node surgeries. We also discussed the final staging along with previously performed ER/PR and HER-2/neu testing.  Recommendation: 1.  Oncotype DX testing 2. adjuvant radiation 3.  Followed by adjuvant antiestrogen therapy with letrozole 2.5 mg daily x5 years  Oncotype counseling: I discussed Oncotype DX test. I explained to the patient that this is a 21 gene panel to evaluate patient tumors DNA to calculate recurrence score. This would help determine whether patient has high risk or intermediate risk or low risk breast cancer. She understands that if her tumor was found to be high risk, she would benefit from systemic chemotherapy. If low risk, no need of chemotherapy. If she was found to be intermediate risk, we would need to evaluate the score as well as other risk factors and determine if an abbreviated chemotherapy  may be of benefit.  Patient will be moving to [place] after radiation therapy is complete if she is able to sell her house   No orders of the defined types were placed in this encounter.  The patient has a good understanding of the overall plan. she agrees with it. she will call with any problems that may develop before the next visit here.   Harriette Ohara, MD 05/31/17

## 2017-05-31 NOTE — Telephone Encounter (Signed)
Received order for oncotype testing per Dr. Lindi Adie. Requisition sent pathology. Received by Varney Biles.

## 2017-05-31 NOTE — Assessment & Plan Note (Signed)
05/24/2017: Left lumpectomy: IDC grade 1, 1.8 cm, resection margins negative, ER 100%, PR 60%, Ki-67 10%, HER-2 negative ratio 1.4, T1c N0 stage I a  Pathology counseling: I discussed the final pathology report of the patient provided  a copy of this report. I discussed the margins as well as lymph node surgeries. We also discussed the final staging along with previously performed ER/PR and HER-2/neu testing.  Recommendation: 1.  Oncotype DX testing 2. adjuvant radiation 3.  Followed by adjuvant antiestrogen therapy with letrozole 2.5 mg daily x5 years  Patient will be moving to [place]

## 2017-06-05 ENCOUNTER — Ambulatory Visit: Payer: Medicare HMO | Admitting: Family Medicine

## 2017-06-06 NOTE — Progress Notes (Signed)
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   Receptor Status: ER(1005 +), PR (605+), Her2-neu (-)ratio 1.4      , Ki-(10%)  Did patient present with symptoms (if so, please note symptoms) or was this found on screening mammography?: Screening detected mass in the left breast     Past/Anticipated interventions by surgeon, if any:  Diagnosis 05-24-17 Dr. Rolm Bookbinder 1. 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 - THE SURGICAL RESECTION MARGINS ARE NEGATIVE FOR CARCINOMA. - SEE ONCOLOGY TABLE BELOW. 2. Breast, excision, left posterior margin - BENIGN ADIPOSE TISSUE. - THERE IS NO EVIDENCE OF MALIGNANCY. - SEE COMMENT. 3. Breast, excision, left superior margin - BENIGN BREAST PARENCHYMA. - THERE IS NO EVIDENCE OF MALIGNANCY. - SEE COMMENT. Microscopic Comment 1. INVASIVE CARCINOMA OF THE BREAST: Resection   Receptor Status: ER(1005 +), PR (605+), Her2-neu (-), Ki-(10%)  Past/Anticipated interventions by medical oncology, if any:Dr. Lindi Adie   Chemotherapy No oncotype testing requested per Dr. Lindi Adie 05-31-17    Adjuvant radiation therapy  Followed by adjuvant antiestrogen therapy with  letrozole 2.5 mg daily x5 years    Lymphedema issues, if any:  No  Rom to left arm good Skin to left breast incision healing well without signs of infection.  follow up appointment with Dr. Rolm Bookbinder Thursday 06/14/17.  Pain issues, if any:No    SAFETY ISSUES:  Prior radiation? : No  Pacemaker/ICD? : No  Possible current pregnancy?: No  Is the patient on methotrexate? No    Menarche 12  G5 P3  BC Yes     Tubal ligation 1979 LMP Menopause HRT  Current Complaints / other details: Hepatitis Father lung/bone cancer,  pancreatic cancer maternal grandfather and sister colon cancer   Wt Readings from Last 3 Encounters:  06/12/17 176 lb 6.4 oz (80 kg)  06/08/17 176 lb (79.8 kg)  05/31/17 177 lb 9.6 oz (80.6 kg)  BP 119/61 (BP Location: Right Arm, Patient Position: Sitting, Cuff Size: Normal)   Pulse 64   Temp 97.8 F (36.6 C) (Oral)   Resp 16   Ht 5' 3.5" (1.613 m)   Wt 176 lb 6.4 oz (80 kg)   SpO2 98%   BMI 30.76 kg/m   Georgena Spurling, RN 06/06/2017,4:01 PM

## 2017-06-07 DIAGNOSIS — C50212 Malignant neoplasm of upper-inner quadrant of left female breast: Secondary | ICD-10-CM | POA: Diagnosis not present

## 2017-06-07 DIAGNOSIS — Z170 Estrogen receptor positive status [ER+]: Secondary | ICD-10-CM | POA: Diagnosis not present

## 2017-06-08 ENCOUNTER — Ambulatory Visit (INDEPENDENT_AMBULATORY_CARE_PROVIDER_SITE_OTHER): Payer: Medicare HMO | Admitting: Family Medicine

## 2017-06-08 ENCOUNTER — Telehealth: Payer: Self-pay | Admitting: *Deleted

## 2017-06-08 ENCOUNTER — Encounter: Payer: Self-pay | Admitting: Family Medicine

## 2017-06-08 VITALS — BP 100/70 | HR 74 | Temp 99.5°F | Wt 176.0 lb

## 2017-06-08 DIAGNOSIS — J441 Chronic obstructive pulmonary disease with (acute) exacerbation: Secondary | ICD-10-CM | POA: Diagnosis not present

## 2017-06-08 MED ORDER — PREDNISONE 20 MG PO TABS: 40.0000 mg | ORAL_TABLET | Freq: Every day | ORAL | 0 refills | Status: AC

## 2017-06-08 MED ORDER — IPRATROPIUM-ALBUTEROL 0.5-2.5 (3) MG/3ML IN SOLN
3.0000 mL | Freq: Once | RESPIRATORY_TRACT | Status: AC
  Administered 2017-06-08: 3 mL via RESPIRATORY_TRACT

## 2017-06-08 MED ORDER — AZITHROMYCIN 250 MG PO TABS: ORAL_TABLET | ORAL | 0 refills | Status: DC

## 2017-06-08 NOTE — Patient Instructions (Signed)
Chronic Obstructive Pulmonary Disease Exacerbation Chronic obstructive pulmonary disease (COPD) is a long-term (chronic) condition that affects the lungs. COPD is a general term that can be used to describe many different lung problems that cause lung swelling (inflammation) and limit airflow, including chronic bronchitis and emphysema. COPD exacerbations are episodes when breathing symptoms become much worse and require extra treatment. COPD exacerbations are usually caused by infections. Without treatment, COPD exacerbations can be severe and even life threatening. Frequent COPD exacerbations can cause further damage to the lungs. What are the causes? This condition may be caused by:  Respiratory infections, including viral and bacterial infections.  Exposure to smoke.  Exposure to air pollution, chemical fumes, or dust.  Things that give you an allergic reaction (allergens).  Not taking your usual COPD medicines as directed.  Underlying medical problems, such as congestive heart failure or infections not involving the lungs.  In many cases, the cause (trigger) of this condition is not known. What increases the risk? The following factors may make you more likely to develop this condition:  Smoking cigarettes.  Old age.  Frequent prior COPD exacerbations.  What are the signs or symptoms? Symptoms of this condition include:  Increased coughing.  Increased production of mucus from your lungs (sputum).  Increased wheezing.  Increased shortness of breath.  Rapid or labored breathing.  Chest tightness.  Less energy than usual.  Sleep disruption from symptoms.  Confusion or increased sleepiness.  Often these symptoms happen or get worse even with the use of medicines. How is this diagnosed? This condition is diagnosed based on:  Your medical history.  A physical exam.  You may also have tests, including:  A chest X-ray.  Blood tests.  Lung (pulmonary)  function tests.  How is this treated? Treatment for this condition depends on the severity and cause of the symptoms. You may need to be admitted to a hospital for treatment. Some of the treatments commonly used to treat COPD exacerbations are:  Antibiotic medicines. These may be used for severe exacerbations caused by a lung infection, such as pneumonia.  Bronchodilators. These are inhaled medicines that expand the air passages and allow increased airflow.  Steroid medicines. These act to reduce inflammation in the airways. They may be given with an inhaler, taken by mouth, or given through an IV tube inserted into one of your veins.  Supplemental oxygen therapy.  Airway clearing techniques, such as noninvasive ventilation (NIV) and positive expiratory pressure (PEP). These provide respiratory support through a mask or other noninvasive device. An example of this would be using a continuous positive airway pressure (CPAP) machine to improve delivery of oxygen into your lungs.  Follow these instructions at home: Medicines  Take over-the-counter and prescription medicines only as told by your health care provider. It is important to use correct technique with inhaled medicines.  If you were prescribed an antibiotic medicine or oral steroid, take it as told by your health care provider. Do not stop taking the medicine even if you start to feel better. Lifestyle  Eat a healthy diet.  Exercise regularly.  Get plenty of sleep.  Avoid exposure to all substances that irritate the airway, especially to tobacco smoke.  Wash your hands often with soap and water to reduce the risk of infection. If soap and water are not available, use hand sanitizer.  During flu season, avoid enclosed spaces that are crowded with people. General instructions  Drink enough fluid to keep your urine clear or pale yellow (  unless you have a medical condition that requires fluid restriction).  Use a cool mist  vaporizer. This humidifies the air and makes it easier for you to clear your chest when you cough.  If you have a home nebulizer and oxygen, continue to use them as told by your health care provider.  Keep all follow-up visits as told by your health care provider. This is important. How is this prevented?  Stay up-to-date on pneumococcal and influenza (flu) vaccines. A flu shot is recommended every year to help prevent exacerbations.  Do not use any products that contain nicotine or tobacco, such as cigarettes and e-cigarettes. Quitting smoking is very important in preventing COPD from getting worse and in preventing exacerbations from happening as often. If you need help quitting, ask your health care provider.  Follow all instructions for pulmonary rehabilitation after a recent exacerbation. This can help prevent future exacerbations.  Work with your health care provider to develop and follow an action plan. This tells you what steps to take when you experience certain symptoms. Contact a health care provider if:  You have a worsening of your regular COPD symptoms. Get help right away if:  You have worsening shortness of breath, even when resting.  You have trouble talking.  You have severe chest pain.  You cough up blood.  You have a fever.  You have weakness, vomit repeatedly, or faint.  You feel confused.  You are not able to sleep because of your symptoms.  You have trouble doing daily activities. Summary  COPD exacerbations are episodes when breathing symptoms become much worse and require extra treatment above your normal treatment.  Exacerbations can be severe and even life threatening. Frequent COPD exacerbations can cause further damage to your lungs.  COPD exacerbations are usually triggered by infections such as the flu, colds, and even pneumonia.  Treatment for this condition depends on the severity and cause of the symptoms. You may need to be admitted to a  hospital for treatment.  Quitting smoking is very important to prevent COPD from getting worse and to prevent exacerbations from happening as often. This information is not intended to replace advice given to you by your health care provider. Make sure you discuss any questions you have with your health care provider. Document Released: 10/30/2006 Document Revised: 02/07/2016 Document Reviewed: 02/07/2016 Elsevier Interactive Patient Education  2018 [employer]  

## 2017-06-08 NOTE — Addendum Note (Signed)
Addended by: Wyvonne Lenz on: 06/08/2017 04:53 PM   Modules accepted: Orders

## 2017-06-08 NOTE — Telephone Encounter (Signed)
Received oncotype results of 19/6%.  Patient is aware.  She will see Dr. Lisbeth Renshaw 5/28.  Dr. Lindi Adie will see her after XRT complete.

## 2017-06-08 NOTE — Progress Notes (Signed)
Subjective:    Patient ID: Cassandra James, female    DOB: Jan 16, 1946, 72 y.o.   MRN: [id number]  No chief complaint on file.   HPI Patient was seen today for acute concern.  Pt endorses productive cough, chest congestion, sneezing, sore throat, ear pain since yesterday.  Patient denies fever  Or sick contacts.  Pt was concerned and she is supposed to start radiation for breast cancer on Tuesday.  Patient is a former smoker.  Endorses "mild COPD", has an albuterol inhaler.  Past Medical History:  Diagnosis Date  . Cancer (HCC)    LT BREAST  . Cataract   . Colon polyps    history of  . COLONIC POLYPS, HX OF 08/09/2006   Qualifier: Diagnosis of  By: Scherrie Gerlach    . COPD (chronic obstructive pulmonary disease) (HCC)    BORDERLINE  SEES DR. Melvyn Novas PRN  . DECREASED HEARING 11/25/2008   Qualifier: Diagnosis of  By: Scherrie Gerlach    . Dyspnea    W/ EXERTION   . Elevated LFTs    obstructive disease  . 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 HIATAL HERNIA 02/05/2009   Qualifier: Diagnosis of  By: Harriet Pho    . Hyperglycemia 11/10/2013  . Hyperlipidemia   . Hypertension   . Osteoporosis    was osteopenia-last bone scan all clear   . Pernicious anemia   . Pneumonia    2018    Allergies  Allergen Reactions  . Cephalexin Anaphylaxis  . Cephalosporins Anaphylaxis  . Codeine Anaphylaxis and Other (See Comments)    Patient stated a cough medication caused nausea, itching and decreased BP  . Tessalon [Benzonatate] Nausea Only    ROS General: Denies fever, chills, night sweats, changes in weight, changes in appetite HEENT: Denies headaches, changes in vision, rhinorrhea +congestion, sore throat, ear pain, sneezing CV: Denies CP, palpitations, SOB, orthopnea Pulm: Denies SOB, wheezing  +productive cough GI: Denies abdominal pain, nausea, vomiting, diarrhea, constipation GU: Denies dysuria, hematuria, frequency, vaginal discharge Msk: Denies muscle cramps, joint  pains Neuro: Denies weakness, numbness, tingling Skin: Denies rashes, bruising Psych: Denies depression, anxiety, hallucinations     Objective:    Blood pressure 100/70, pulse 74, temperature 99.5 F (37.5 C), temperature source Oral, weight 176 lb (79.8 kg), SpO2 95 %.   Gen. Pleasant, well-nourished, in no distress, normal affect   HEENT: Wearing hearing aids.  [place]/AT, face symmetric, no scleral icterus, PERRLA, EOMI, nares patent without drainage, pharynx without erythema or exudate.  TMs normal bilaterally.  No cervical lymphadenopathy. Lungs: no accessory muscle use, scattered wheezes throughout and transmitted upper airway noises.  Improvement in wheezing after neb. Cardiovascular: RRR, no m/r/g, no peripheral edema Abdomen: BS present, soft, NT/ND Neuro:  A&Ox3, CN II-XII intact, normal gait   Wt Readings from Last 3 Encounters:  06/08/17 176 lb (79.8 kg)  05/31/17 177 lb 9.6 oz (80.6 kg)  05/22/17 177 lb 6.4 oz (80.5 kg)    Lab Results  Component Value Date   WBC 8.4 05/22/2017   HGB 14.4 05/22/2017   HCT 43.2 05/22/2017   PLT 259 05/22/2017   GLUCOSE 110 (H) 05/22/2017   CHOL 152 06/19/2016   TRIG 177.0 (H) 06/19/2016   HDL 48.90 06/19/2016   LDLDIRECT 105.8 07/27/2008   LDLCALC 68 06/19/2016   ALT 17 05/22/2017   AST 16 05/22/2017   NA 140 05/22/2017   K 4.1 05/22/2017   CL  103 05/22/2017   CREATININE 0.74 05/22/2017   BUN 16 05/22/2017   CO2 27 05/22/2017   TSH 1.74 06/19/2016   INR 0.93 05/22/2017   HGBA1C 6.3 12/19/2016    Assessment/Plan:  COPD exacerbation ([place]) -Likely started as viral illness, however given smoking history and increased sputum production will treat for COPD exacerbation. -Patient has allergy to Tessalon. -Continue to use albuterol inhaler as needed. -Patient given RTC or ED precautions. - Plan: azithromycin (ZITHROMAX) 250 MG tablet, predniSONE (DELTASONE) 20 MG tablet Follow-up PRN  Grier Mitts, MD

## 2017-06-12 ENCOUNTER — Ambulatory Visit
Admission: RE | Admit: 2017-06-12 | Discharge: 2017-06-12 | Disposition: A | Discharge status: Home / Self Care | Payer: Medicare HMO | Source: Ambulatory Visit | Attending: Radiation Oncology | Admitting: Radiation Oncology

## 2017-06-12 ENCOUNTER — Other Ambulatory Visit: Payer: Self-pay

## 2017-06-12 ENCOUNTER — Encounter: Payer: Self-pay | Admitting: Radiation Oncology

## 2017-06-12 ENCOUNTER — Encounter (HOSPITAL_COMMUNITY): Payer: Self-pay | Admitting: Hematology and Oncology

## 2017-06-12 VITALS — BP 119/61 | HR 64 | Temp 97.8°F | Resp 16 | Ht 63.5 in | Wt 176.4 lb

## 2017-06-12 DIAGNOSIS — Z79899 Other long term (current) drug therapy: Secondary | ICD-10-CM | POA: Diagnosis not present

## 2017-06-12 DIAGNOSIS — E039 Hypothyroidism, unspecified: Secondary | ICD-10-CM | POA: Insufficient documentation

## 2017-06-12 DIAGNOSIS — D510 Vitamin B12 deficiency anemia due to intrinsic factor deficiency: Secondary | ICD-10-CM | POA: Diagnosis not present

## 2017-06-12 DIAGNOSIS — C50212 Malignant neoplasm of upper-inner quadrant of left female breast: Secondary | ICD-10-CM

## 2017-06-12 DIAGNOSIS — Z800 Family history of malignant neoplasm of digestive organs: Secondary | ICD-10-CM | POA: Insufficient documentation

## 2017-06-12 DIAGNOSIS — I10 Essential (primary) hypertension: Secondary | ICD-10-CM | POA: Diagnosis not present

## 2017-06-12 DIAGNOSIS — Z9889 Other specified postprocedural states: Secondary | ICD-10-CM | POA: Diagnosis not present

## 2017-06-12 DIAGNOSIS — Z87891 Personal history of nicotine dependence: Secondary | ICD-10-CM | POA: Insufficient documentation

## 2017-06-12 DIAGNOSIS — R0602 Shortness of breath: Secondary | ICD-10-CM | POA: Insufficient documentation

## 2017-06-12 DIAGNOSIS — J449 Chronic obstructive pulmonary disease, unspecified: Secondary | ICD-10-CM | POA: Insufficient documentation

## 2017-06-12 DIAGNOSIS — E785 Hyperlipidemia, unspecified: Secondary | ICD-10-CM | POA: Insufficient documentation

## 2017-06-12 DIAGNOSIS — K449 Diaphragmatic hernia without obstruction or gangrene: Secondary | ICD-10-CM | POA: Insufficient documentation

## 2017-06-12 DIAGNOSIS — Z86010 Personal history of colonic polyps: Secondary | ICD-10-CM | POA: Insufficient documentation

## 2017-06-12 DIAGNOSIS — Z170 Estrogen receptor positive status [ER+]: Secondary | ICD-10-CM | POA: Insufficient documentation

## 2017-06-12 NOTE — Progress Notes (Signed)
Radiation Oncology         [phone]) [phone] ________________________________  Name: Cassandra James        MRN: [id number]  Date of Service: 06/12/2017 DOB: [birth year]  CC:Kim, Nickola Major, DO  Nicholas Lose, MD     REFERRING PHYSICIAN: Nicholas Lose, MD   DIAGNOSIS: The encounter diagnosis was Malignant neoplasm of upper-inner quadrant of left breast in female, estrogen receptor positive ([place]).   HISTORY OF PRESENT ILLNESS: Cassandra James is a 72 y.o. female originally seen in the multidisciplinary breast clinic for a new diagnosis of left breast cancer. The patient was noted to have screening detected mass in the left breast at 11:30 position. She had diagnostic imaging which revealed a mass at the 11:30 site measuring 4 x 4 x 3 mm. She underwent a biopsy of this site on 05/04/17 which revealed a grade 1, invasive ductal carcinoma with DCIS, ER/PR positive, HER2 negative, and her Ki 67 was 10%. She underwent lumpectomy on 05/24/17 and final pathology revealed a 18 mm, invasive ductal carcinoma, grade 1, with negative margins. No nodes were sampled. She did have oncotype performed and this was 19, and no chemotherapy will be given. She comes today to discuss adjuvant treatment.  PREVIOUS RADIATION THERAPY: No   PAST MEDICAL HISTORY:  Past Medical History:  Diagnosis Date  . Cancer (HCC)    LT BREAST  . Cataract   . Colon polyps    history of  . COLONIC POLYPS, HX OF 08/09/2006   Qualifier: Diagnosis of  By: Scherrie Gerlach    . COPD (chronic obstructive pulmonary disease) (HCC)    BORDERLINE  SEES DR. Melvyn Novas PRN  . DECREASED HEARING 11/25/2008   Qualifier: Diagnosis of  By: Scherrie Gerlach    . Dyspnea    W/ EXERTION   . Elevated LFTs    obstructive disease  . 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 HIATAL HERNIA 02/05/2009   Qualifier: Diagnosis of  By: Harriet Pho    . Hyperglycemia 11/10/2013  . Hyperlipidemia   . Hypertension   . Osteoporosis    was osteopenia-last bone scan  all clear   . Pernicious anemia   . Pneumonia    2018       PAST SURGICAL HISTORY: Past Surgical History:  Procedure Laterality Date  . APPENDECTOMY  1968   incendental  . BREAST LUMPECTOMY WITH RADIOACTIVE SEED LOCALIZATION Left 05/24/2017   Procedure: BREAST LUMPECTOMY WITH RADIOACTIVE SEED LOCALIZATION;  Surgeon: Rolm Bookbinder, MD;  Location: [facility];  Service: General;  Laterality: Left;  . CARPAL TUNNEL RELEASE Left 2016  . CATARACT EXTRACTION W/ INTRAOCULAR LENS  IMPLANT, BILATERAL     2009  . cataract surgery  2011   Bilateral  . COLONOSCOPY    . HAND TENDON SURGERY  01/16/2014   Dr Burney Gauze  . OVARIAN CYST SURGERY  1968  . RETINAL DETACHMENT SURGERY  2011   left  . TOENAIL EXCISION Right    Dr Patrick Jupiter great toenail excision  . TRAPEZIUM RESECTION Left 2016   left wrist   . TUBAL LIGATION  1979     FAMILY HISTORY:  Family History  Problem Relation Age of Onset  . Pernicious anemia Mother   . Cancer Father        lung/ bone  . Osteoporosis Father   . Hypothyroidism Father   . Colon cancer Sister   . Cancer - Colon Sister   . Pancreatic  cancer Maternal Grandfather   . Colon polyps Neg Hx   . Rectal cancer Neg Hx   . Stomach cancer Neg Hx      SOCIAL HISTORY:  reports that she quit smoking about 17 years ago. Her smoking use included cigarettes. She started smoking about 55 years ago. She smoked 1.00 pack per day. She has never used smokeless tobacco. She reports that she drinks about 2.4 - 3.0 oz of alcohol per week. She reports that she does not use drugs. The patient is married and lives in [place]. She is retired from working as a Marine scientist, her husband is a retired Doctor, general practice.   ALLERGIES: Cephalexin; Cephalosporins; Codeine; and Tessalon [benzonatate]   MEDICATIONS:  Current Outpatient Medications  Medication Sig Dispense Refill  . albuterol (PROAIR HFA) 108 (90 Base) MCG/ACT inhaler 2 puffs every 4 hours as needed only  if your can't  catch your breath (Patient taking differently: Inhale 1 puff into the lungs every 4 (four) hours as needed for wheezing or shortness of breath. ) 1 Inhaler 11  . azithromycin (ZITHROMAX) 250 MG tablet Take 2 tabs on day 1.  Then take 1 tab daily on days 2 through 5. 6 tablet 0  . cholecalciferol (VITAMIN D) 1000 units tablet Take 1,000 Units by mouth daily.    . cyanocobalamin (,VITAMIN B-12,) 1000 MCG/ML injection INJECT 1ML INTO THE MUSCLE EVERY 30 DAYS (Patient taking differently: Inject 1,000 mcg into the muscle every 30 (thirty) days. ) 12 mL 3  . hydrochlorothiazide (HYDRODIURIL) 25 MG tablet TAKE 1 TABLET BY MOUTH EVERY DAY 90 tablet 1  . naproxen sodium (ALEVE) 220 MG tablet Take 220 mg by mouth 2 (two) times daily as needed (for pain or headache).    . Omega-3 Fatty Acids (FISH OIL) 1200 MG CAPS Take 1,200 mg by mouth daily.    . potassium chloride SA (KLOR-CON M20) 20 MEQ tablet Take 1 tablet (20 mEq total) by mouth daily. 90 tablet 1  . predniSONE (DELTASONE) 20 MG tablet Take 2 tablets (40 mg total) by mouth daily with breakfast for 4 days. 8 tablet 0  . rosuvastatin (CRESTOR) 10 MG tablet Take 1 tablet (10 mg total) by mouth daily. 90 tablet 3  . telmisartan (MICARDIS) 40 MG tablet Take 1 tablet (40 mg total) by mouth daily. 90 tablet 1  . hyoscyamine (LEVSIN SL) 0.125 MG SL tablet Place 2 tablets (0.25 mg total) under the tongue every 4 (four) hours as needed for cramping. (Patient not taking: Reported on 06/12/2017) 90 tablet 0   No current facility-administered medications for this encounter.      REVIEW OF SYSTEMS: On review of systems, the patient reports that she is doing well overall. She denies any chest pain, shortness of breath, cough, fevers, chills, night sweats, unintended weight changes. She denies any bowel or bladder disturbances, and denies abdominal pain, nausea or vomiting. She denies any new musculoskeletal or joint aches or pains, new skin lesions or concerns. A  complete review of systems is obtained and is otherwise negative.     PHYSICAL EXAM:  Wt Readings from Last 3 Encounters:  06/12/17 176 lb 6.4 oz (80 kg)  06/08/17 176 lb (79.8 kg)  05/31/17 177 lb 9.6 oz (80.6 kg)   Temp Readings from Last 3 Encounters:  06/12/17 97.8 F (36.6 C) (Oral)  06/08/17 99.5 F (37.5 C) (Oral)  05/31/17 (!) 97.4 F (36.3 C) (Oral)   BP Readings from Last 3 Encounters:  06/12/17 119/61  06/08/17 100/70  05/31/17 (!) 131/91   Pulse Readings from Last 3 Encounters:  06/12/17 64  06/08/17 74  05/31/17 64     In general this is a well appearing caucasian female in no acute distress. She is alert and oriented x4 and appropriate throughout the examination. HEENT reveals that the patient is normocephalic, atraumatic. EOMs are intact.  Skin is intact without any evidence of gross lesions. Cardiopulmonary assessment is negative for acute distress and she exhibits normal effort. The left lumpectomy incision is healing well, but from 12:00-2:00 there is concern for possible seroma. This area is about 6 cm in greatest dimension. It is nontender and no erythema is noted.    ECOG = 0  0 - Asymptomatic (Fully active, able to carry on all predisease activities without restriction)  1 - Symptomatic but completely ambulatory (Restricted in physically strenuous activity but ambulatory and able to carry out work of a light or sedentary nature. For example, light housework, office work)  2 - Symptomatic, <50% in bed during the day (Ambulatory and capable of all self care but unable to carry out any work activities. Up and about more than 50% of waking hours)  3 - Symptomatic, >50% in bed, but not bedbound (Capable of only limited self-care, confined to bed or chair 50% or more of waking hours)  4 - Bedbound (Completely disabled. Cannot carry on any self-care. Totally confined to bed or chair)  5 - Death   Eustace Pen MM, Creech RH, Tormey DC, et al. [phone]). "Toxicity  and response criteria of the [facility] Group". [place] Oncol. 5 (6): 649-55    LABORATORY DATA:  Lab Results  Component Value Date   WBC 8.4 05/22/2017   HGB 14.4 05/22/2017   HCT 43.2 05/22/2017   MCV 84.0 05/22/2017   PLT 259 05/22/2017   Lab Results  Component Value Date   NA 140 05/22/2017   K 4.1 05/22/2017   CL 103 05/22/2017   CO2 27 05/22/2017   Lab Results  Component Value Date   ALT 17 05/22/2017   AST 16 05/22/2017   ALKPHOS 64 05/22/2017   BILITOT 0.6 05/22/2017      RADIOGRAPHY: Mm Breast Surgical Specimen  Result Date: 05/24/2017 CLINICAL DATA:  Post left breast lumpectomy. EXAM: SPECIMEN RADIOGRAPH OF THE LEFT BREAST COMPARISON:  Previous exam(s). FINDINGS: Status post excision of the left breast. The radioactive seed and biopsy marker clip are present, completely intact, and were marked for pathology. IMPRESSION: Specimen radiograph of the left breast. Electronically Signed   By: Everlean Alstrom M.D.   On: 05/24/2017 14:55   Mm Lt Radioactive Seed Loc Mammo Guide  Result Date: 05/22/2017 CLINICAL DATA:  Patient presents for seed localization prior to LEFT lumpectomy for invasive and in situ ductal carcinoma of the LEFT breast, 11:30 o'clock location. EXAM: MAMMOGRAPHIC GUIDED RADIOACTIVE SEED LOCALIZATION OF THE LEFT BREAST COMPARISON:  Previous exam(s). FINDINGS: Patient presents for radioactive seed localization prior to lumpectomy. I met with the patient and we discussed the procedure of seed localization including benefits and alternatives. We discussed the high likelihood of a successful procedure. We discussed the risks of the procedure including infection, bleeding, tissue injury and further surgery. We discussed the low dose of radioactivity involved in the procedure. Informed, written consent was given. The usual time-out protocol was performed immediately prior to the procedure. Using mammographic guidance, sterile technique, 1%  lidocaine and an I-125 radioactive seed, ribbon shaped clip in the UPPER  INNER QUADRANT of the LEFT breast was localized using a superior to inferior approach. The follow-up mammogram images confirm the seed in the expected location and were marked for Dr. Donne Hazel. Follow-up survey of the patient confirms presence of the radioactive seed. Order number of I-125 seed:  [id number]. Total activity:  9.702 millicuries reference Date: 04/13/2017 The patient tolerated the procedure well and was released from the [facility]. She was given instructions regarding seed removal. IMPRESSION: Radioactive seed localization left breast. No apparent complications. Electronically Signed   By: Nolon Nations M.D.   On: 05/22/2017 13:59       IMPRESSION/PLAN: 1. Stage IA, pT1cN0M0, grade 1 ER/PR positive invasive ductal carcinoma with DCIS of the left breast. Dr. Lisbeth Renshaw discusses the pathology findings rationale to proceed. We will await her evaluation due to concerns about seroma in the breast. We discussed the risks, benefits, short, and long term effects of radiotherapy, and the patient is interested in proceeding. Dr. Lisbeth Renshaw discusses the delivery and logistics of radiotherapy and he recommends a course of 4 weeks of radiotherapy with deep inspiration breath hold technique. Written consent is obtained and placed in the chart, a copy was provided to the patient. She will see Dr. Donne Hazel Thursday for postop evaluation and we will coordinate simulation once she's been seen by him.  In a visit lasting 25 minutes, greater than 50% of the time was spent face to face discussing her case, and coordinating the patient's care.   The above documentation reflects my direct findings during this shared patient visit. Please see the separate note by Dr. Lisbeth Renshaw on this date for the remainder of the patient's plan of care.    Carola Rhine, PAC

## 2017-06-18 DIAGNOSIS — L658 Other specified nonscarring hair loss: Secondary | ICD-10-CM | POA: Diagnosis not present

## 2017-06-19 ENCOUNTER — Ambulatory Visit
Admission: RE | Admit: 2017-06-19 | Discharge: 2017-06-19 | Disposition: A | Discharge status: Home / Self Care | Payer: Medicare HMO | Source: Ambulatory Visit | Attending: Radiation Oncology | Admitting: Radiation Oncology

## 2017-06-19 DIAGNOSIS — C50212 Malignant neoplasm of upper-inner quadrant of left female breast: Secondary | ICD-10-CM | POA: Diagnosis not present

## 2017-06-19 DIAGNOSIS — Z170 Estrogen receptor positive status [ER+]: Secondary | ICD-10-CM | POA: Insufficient documentation

## 2017-06-19 DIAGNOSIS — Z510 Encounter for antineoplastic radiation therapy: Secondary | ICD-10-CM | POA: Insufficient documentation

## 2017-06-21 DIAGNOSIS — Z510 Encounter for antineoplastic radiation therapy: Secondary | ICD-10-CM | POA: Diagnosis not present

## 2017-06-21 DIAGNOSIS — Z170 Estrogen receptor positive status [ER+]: Secondary | ICD-10-CM | POA: Diagnosis not present

## 2017-06-21 DIAGNOSIS — C50212 Malignant neoplasm of upper-inner quadrant of left female breast: Secondary | ICD-10-CM | POA: Diagnosis not present

## 2017-06-25 ENCOUNTER — Telehealth: Payer: Self-pay | Admitting: Hematology and Oncology

## 2017-06-25 NOTE — Telephone Encounter (Signed)
Mailed patient calendar of upcoming appointments per 6/6 sch message.

## 2017-06-26 ENCOUNTER — Ambulatory Visit
Admission: RE | Admit: 2017-06-26 | Discharge: 2017-06-26 | Disposition: A | Discharge status: Home / Self Care | Payer: Medicare HMO | Source: Ambulatory Visit | Attending: Radiation Oncology | Admitting: Radiation Oncology

## 2017-06-26 DIAGNOSIS — Z510 Encounter for antineoplastic radiation therapy: Secondary | ICD-10-CM | POA: Diagnosis not present

## 2017-06-26 DIAGNOSIS — Z170 Estrogen receptor positive status [ER+]: Secondary | ICD-10-CM | POA: Diagnosis not present

## 2017-06-26 DIAGNOSIS — C50212 Malignant neoplasm of upper-inner quadrant of left female breast: Secondary | ICD-10-CM | POA: Diagnosis not present

## 2017-06-27 ENCOUNTER — Ambulatory Visit
Admission: RE | Admit: 2017-06-27 | Discharge: 2017-06-27 | Disposition: A | Discharge status: Home / Self Care | Payer: Medicare HMO | Source: Ambulatory Visit | Attending: Radiation Oncology | Admitting: Radiation Oncology

## 2017-06-27 DIAGNOSIS — C50212 Malignant neoplasm of upper-inner quadrant of left female breast: Secondary | ICD-10-CM | POA: Diagnosis not present

## 2017-06-27 DIAGNOSIS — Z510 Encounter for antineoplastic radiation therapy: Secondary | ICD-10-CM | POA: Diagnosis not present

## 2017-06-27 DIAGNOSIS — Z170 Estrogen receptor positive status [ER+]: Secondary | ICD-10-CM | POA: Diagnosis not present

## 2017-06-27 NOTE — Progress Notes (Signed)
Pt here for patient teaching.  Pt given Radiation and You booklet, skin care instructions, Alra deodorant and Radiaplex gel.  Reviewed areas of pertinence such as fatigue, hair loss, skin changes, breast tenderness and breast swelling . Pt able to give teach back of to pat skin and use unscented/gentle soap,apply Radiaplex bid, avoid applying anything to skin within 4 hours of treatment, avoid wearing an under wire bra and to use an electric razor if they must shave. Pt demonstrated understanding, needs reinforcement, no evidence of learning, refused teaching and  of information given and will contact nursing with any questions or concerns.     Http://rtanswers.org/treatmentinformation/whattoexpect/index      

## 2017-06-28 ENCOUNTER — Ambulatory Visit
Admission: RE | Admit: 2017-06-28 | Discharge: 2017-06-28 | Disposition: A | Discharge status: Home / Self Care | Payer: Medicare HMO | Source: Ambulatory Visit | Attending: Radiation Oncology | Admitting: Radiation Oncology

## 2017-06-28 DIAGNOSIS — C50212 Malignant neoplasm of upper-inner quadrant of left female breast: Secondary | ICD-10-CM | POA: Diagnosis not present

## 2017-06-28 DIAGNOSIS — Z510 Encounter for antineoplastic radiation therapy: Secondary | ICD-10-CM | POA: Diagnosis not present

## 2017-06-28 DIAGNOSIS — Z170 Estrogen receptor positive status [ER+]: Secondary | ICD-10-CM | POA: Diagnosis not present

## 2017-06-29 ENCOUNTER — Ambulatory Visit
Admission: RE | Admit: 2017-06-29 | Discharge: 2017-06-29 | Disposition: A | Discharge status: Home / Self Care | Payer: Medicare HMO | Source: Ambulatory Visit | Attending: Radiation Oncology | Admitting: Radiation Oncology

## 2017-06-29 DIAGNOSIS — Z170 Estrogen receptor positive status [ER+]: Secondary | ICD-10-CM | POA: Diagnosis not present

## 2017-06-29 DIAGNOSIS — C50212 Malignant neoplasm of upper-inner quadrant of left female breast: Secondary | ICD-10-CM | POA: Diagnosis not present

## 2017-06-29 DIAGNOSIS — Z510 Encounter for antineoplastic radiation therapy: Secondary | ICD-10-CM | POA: Diagnosis not present

## 2017-06-29 NOTE — Progress Notes (Signed)
  Radiation Oncology         (336) [phone] ________________________________  Name: Cassandra James MRN: [id number]  Date: 06/19/2017  DOB: 01/11/1946  Optical Surface Tracking Plan:  Since intensity modulated radiotherapy (IMRT) and 3D conformal radiation treatment methods are predicated on accurate and precise positioning for treatment, intrafraction motion monitoring is medically necessary to ensure accurate and safe treatment delivery.  The ability to quantify intrafraction motion without excessive ionizing radiation dose can only be performed with optical surface tracking. Accordingly, surface imaging offers the opportunity to obtain 3D measurements of patient position throughout IMRT and 3D treatments without excessive radiation exposure.  I am ordering optical surface tracking for this patient's upcoming course of radiotherapy. ________________________________  Kyung Rudd, MD 06/29/2017 8:45 AM    Reference:   Ursula Alert, J, et al. Surface imaging-based analysis of intrafraction motion for breast radiotherapy patients.Journal of [place], n. 6, nov. 2014. ISSN [id number].   Available at: <http://www.jacmp.org/index.php/jacmp/article/view/4957>.

## 2017-06-29 NOTE — Progress Notes (Addendum)
  Radiation Oncology         (336) [phone] ________________________________  Name: Cassandra James MRN: [id number]  Date: 06/19/2017  DOB: April 17, 1945   DIAGNOSIS:     ICD-10-CM   1. Malignant neoplasm of upper-inner quadrant of left breast in female, estrogen receptor positive ([place]) C50.212    Z17.0     SIMULATION AND TREATMENT PLANNING NOTE  The patient presented for simulation prior to beginning her course of radiation treatment for her diagnosis of left-sided breast cancer. The patient was placed in a supine position on a breast board. A customized vac-lock bag was constructed and this complex treatment device will be used on a daily basis during her treatment. In this fashion, a CT scan was obtained through the chest area and an isocenter was placed near the chest wall within the breast.  The patient will be planned to receive a course of radiation initially to a dose of 42.56 Gy. This will consist of a whole breast radiotherapy technique. To accomplish this, 2 customized blocks have been designed which will correspond to medial and lateral whole breast tangent fields. This treatment will be accomplished at 2.66 Gy per fraction. A forward planning technique will also be evaluated to determine if this approach improves the plan. It is anticipated that the patient will then receive a 8 Gy boost to the seroma cavity which has been contoured. This will be accomplished at 2 Gy per fraction.   This initial treatment will consist of a 3-D conformal technique. The seroma has been contoured as the primary target structure. Additionally, dose volume histograms of both this target as well as the lungs and heart will also be evaluated. Such an approach is necessary to ensure that the target area is adequately covered while the nearby critical  normal structures are adequately spared.  Plan:  The final anticipated total dose therefore will correspond to 50.56  Gy.    _______________________________   Jodelle Gross, MD, PhD

## 2017-07-02 ENCOUNTER — Ambulatory Visit
Admission: RE | Admit: 2017-07-02 | Discharge: 2017-07-02 | Disposition: A | Discharge status: Home / Self Care | Payer: Medicare HMO | Source: Ambulatory Visit | Attending: Radiation Oncology | Admitting: Radiation Oncology

## 2017-07-02 DIAGNOSIS — C50212 Malignant neoplasm of upper-inner quadrant of left female breast: Secondary | ICD-10-CM | POA: Diagnosis not present

## 2017-07-02 DIAGNOSIS — Z510 Encounter for antineoplastic radiation therapy: Secondary | ICD-10-CM | POA: Diagnosis not present

## 2017-07-02 DIAGNOSIS — Z170 Estrogen receptor positive status [ER+]: Secondary | ICD-10-CM | POA: Diagnosis not present

## 2017-07-03 ENCOUNTER — Ambulatory Visit
Admission: RE | Admit: 2017-07-03 | Discharge: 2017-07-03 | Disposition: A | Discharge status: Home / Self Care | Payer: Medicare HMO | Source: Ambulatory Visit | Attending: Radiation Oncology | Admitting: Radiation Oncology

## 2017-07-03 DIAGNOSIS — Z510 Encounter for antineoplastic radiation therapy: Secondary | ICD-10-CM | POA: Diagnosis not present

## 2017-07-03 DIAGNOSIS — Z170 Estrogen receptor positive status [ER+]: Secondary | ICD-10-CM | POA: Diagnosis not present

## 2017-07-03 DIAGNOSIS — C50212 Malignant neoplasm of upper-inner quadrant of left female breast: Secondary | ICD-10-CM | POA: Diagnosis not present

## 2017-07-04 ENCOUNTER — Ambulatory Visit
Admission: RE | Admit: 2017-07-04 | Discharge: 2017-07-04 | Disposition: A | Discharge status: Home / Self Care | Payer: Medicare HMO | Source: Ambulatory Visit | Attending: Radiation Oncology | Admitting: Radiation Oncology

## 2017-07-04 DIAGNOSIS — Z510 Encounter for antineoplastic radiation therapy: Secondary | ICD-10-CM | POA: Diagnosis not present

## 2017-07-04 DIAGNOSIS — Z170 Estrogen receptor positive status [ER+]: Secondary | ICD-10-CM | POA: Diagnosis not present

## 2017-07-04 DIAGNOSIS — C50212 Malignant neoplasm of upper-inner quadrant of left female breast: Secondary | ICD-10-CM | POA: Diagnosis not present

## 2017-07-05 ENCOUNTER — Ambulatory Visit
Admission: RE | Admit: 2017-07-05 | Discharge: 2017-07-05 | Disposition: A | Discharge status: Home / Self Care | Payer: Medicare HMO | Source: Ambulatory Visit | Attending: Radiation Oncology | Admitting: Radiation Oncology

## 2017-07-05 DIAGNOSIS — Z170 Estrogen receptor positive status [ER+]: Secondary | ICD-10-CM | POA: Diagnosis not present

## 2017-07-05 DIAGNOSIS — Z510 Encounter for antineoplastic radiation therapy: Secondary | ICD-10-CM | POA: Diagnosis not present

## 2017-07-05 DIAGNOSIS — C50212 Malignant neoplasm of upper-inner quadrant of left female breast: Secondary | ICD-10-CM | POA: Diagnosis not present

## 2017-07-05 NOTE — Progress Notes (Signed)
HPI:  Using dictation device. Unfortunately this device frequently misinterprets words/phrases.  Cassandra James is a pleasant 72 y.o. here for follow up. Chronic medical problems summarized below were reviewed for changes and stability and were updated as needed below. These issues and their treatment remain stable for the most part. Reports she is doing great. Reports is tolerating radiation well.  Reports only issue is some PND an docc cough. Sudafed helps.  Denies CP, SOB, DOE, treatment intolerance or new symptoms.  Breast Ca: -seeing oncology for management -discovered 04/2017 on screening mammo -L breast, grade 1 IDC ER 100, PR 60, HER-2 neg; T1 aN0 stage 1 -s/p L lumpectomy 09/24/17; currently doing radiation therapy  HTN: -meds: hctz, telmisartan  Hyperglycemia: -lifetsyle recs  COPD/Asthma: -uses prn albuterol  Hair loss: -sees Dr. Mariel Kansky at  [place]: See pertinent positives and negatives per HPI.  Past Medical History:  Diagnosis Date  . Cancer (HCC)    LT BREAST  . Cataract   . Colon polyps    history of  . COLONIC POLYPS, HX OF 08/09/2006   Qualifier: Diagnosis of  By: Scherrie Gerlach    . COPD (chronic obstructive pulmonary disease) (HCC)    BORDERLINE  SEES DR. Melvyn Novas PRN  . DECREASED HEARING 11/25/2008   Qualifier: Diagnosis of  By: Scherrie Gerlach    . Dyspnea    W/ EXERTION   . Elevated LFTs    obstructive disease  . 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 HIATAL HERNIA 02/05/2009   Qualifier: Diagnosis of  By: Harriet Pho    . Hyperglycemia 11/10/2013  . Hyperlipidemia   . Hypertension   . Osteoporosis    was osteopenia-last bone scan all clear   . Pernicious anemia   . Pneumonia    2018    Past Surgical History:  Procedure Laterality Date  . APPENDECTOMY  1968   incendental  . BREAST LUMPECTOMY WITH RADIOACTIVE SEED LOCALIZATION Left 05/24/2017   Procedure: BREAST LUMPECTOMY WITH RADIOACTIVE SEED LOCALIZATION;  Surgeon: Rolm Bookbinder, MD;  Location: [facility];  Service: General;  Laterality: Left;  . CARPAL TUNNEL RELEASE Left 2016  . CATARACT EXTRACTION W/ INTRAOCULAR LENS  IMPLANT, BILATERAL     2009  . cataract surgery  2011   Bilateral  . COLONOSCOPY    . HAND TENDON SURGERY  01/16/2014   Dr Burney Gauze  . OVARIAN CYST SURGERY  1968  . RETINAL DETACHMENT SURGERY  2011   left  . TOENAIL EXCISION Right    Dr Patrick Jupiter great toenail excision  . TRAPEZIUM RESECTION Left 2016   left wrist   . TUBAL LIGATION  1979    Family History  Problem Relation Age of Onset  . Pernicious anemia Mother   . Cancer Father        lung/ bone  . Osteoporosis Father   . Hypothyroidism Father   . Colon cancer Sister   . Cancer - Colon Sister   . Pancreatic cancer Maternal Grandfather   . Colon polyps Neg Hx   . Rectal cancer Neg Hx   . Stomach cancer Neg Hx     SOCIAL HX:see hpi   Current Outpatient Medications:  .  albuterol (PROAIR HFA) 108 (90 Base) MCG/ACT inhaler, 2 puffs every 4 hours as needed only  if your can't catch your breath (Patient taking differently: Inhale 1 puff into the lungs every 4 (four) hours as needed for wheezing or  shortness of breath. ), Disp: 1 Inhaler, Rfl: 11 .  cholecalciferol (VITAMIN D) 1000 units tablet, Take 1,000 Units by mouth daily., Disp: , Rfl:  .  cyanocobalamin (,VITAMIN B-12,) 1000 MCG/ML injection, INJECT 1ML INTO THE MUSCLE EVERY 30 DAYS (Patient taking differently: Inject 1,000 mcg into the muscle every 30 (thirty) days. ), Disp: 12 mL, Rfl: 3 .  hydrochlorothiazide (HYDRODIURIL) 25 MG tablet, TAKE 1 TABLET BY MOUTH EVERY DAY, Disp: 90 tablet, Rfl: 1 .  hyoscyamine (LEVSIN SL) 0.125 MG SL tablet, Place 2 tablets (0.25 mg total) under the tongue every 4 (four) hours as needed for cramping., Disp: 90 tablet, Rfl: 0 .  naproxen sodium (ALEVE) 220 MG tablet, Take 220 mg by mouth 2 (two) times daily as needed (for pain or headache)., Disp: , Rfl:  .  Omega-3 Fatty Acids (FISH OIL)  1200 MG CAPS, Take 1,200 mg by mouth daily., Disp: , Rfl:  .  potassium chloride SA (KLOR-CON M20) 20 MEQ tablet, Take 1 tablet (20 mEq total) by mouth daily., Disp: 90 tablet, Rfl: 1 .  rosuvastatin (CRESTOR) 10 MG tablet, Take 1 tablet (10 mg total) by mouth daily., Disp: 90 tablet, Rfl: 3 .  telmisartan (MICARDIS) 40 MG tablet, Take 1 tablet (40 mg total) by mouth daily., Disp: 90 tablet, Rfl: 1  EXAM:  Vitals:   07/09/17 0851  BP: 116/70  Pulse: 69  Temp: 97.8 F (36.6 C)    Body mass index is 30.3 kg/m.  GENERAL: vitals reviewed and listed above, alert, oriented, appears well hydrated and in no acute distress  HEENT: atraumatic, conjunttiva clear, no obvious abnormalities on inspection of external nose and ears, normal appearance of ear canals and TMs, clear nasal congestion, mild post oropharyngeal erythema with PND, no tonsillar edema or exudate, no sinus TTP  NECK: no obvious masses on inspection  LUNGS: clear to auscultation bilaterally, no wheezes, rales or rhonchi, good air movement  CV: HRRR, no peripheral edema  MS: moves all extremities without noticeable abnormality  PSYCH: pleasant and cooperative, no obvious depression or anxiety  ASSESSMENT AND PLAN:  Discussed the following assessment and plan:  Essential hypertension  Malignant neoplasm of female breast, unspecified estrogen receptor status, unspecified laterality, unspecified site of breast (HCC)  Cough  Hyperglycemia - Plan: Hemoglobin A1c  -seeing oncology and radiation for treatment breast ca and reports is doing great -discussed common causes chronic cough/pnd - she plans to try trial OTC allergy regimen for a few weeks, then trial low dose antiacid for a few weeks if not resolved and see Pulm if persists -check hgba1c -BP good -lifestyle recs -follow up 3-4 months  Patient Instructions  BEFORE YOU LEAVE: -lab -follow up: 3-4 months for AWV with Manuela Schwartz if due and follow up with Dr.  Maudie Mercury  We have ordered labs or studies at this visit. It can take up to 1-2 weeks for results and processing. IF results require follow up or explanation, we will call you with instructions. Clinically stable results will be released to your [facility]. If you have not heard from [place] or cannot find your results in [facility] in 2 weeks please contact our office at [phone].  If you are not yet signed up for [facility], please consider signing up.  Try the allergy medications for the cough - zyrtec or claritin and flonase if ok with your oncology  We recommend the following healthy lifestyle for LIFE: 1) Small portions. But, make sure to get regular (at least 3 per  day), healthy meals and small healthy snacks if needed.  2) Eat a healthy clean diet.   TRY TO EAT: -at least 5-7 servings of low sugar, colorful, and nutrient rich vegetables per day (not corn, potatoes or bananas.) -berries are the best choice if you wish to eat fruit (only eat small amounts if trying to reduce weight)  -lean meets (fish, white meat of chicken or [place]) -vegan proteins for some meals - beans or tofu, whole grains, nuts and seeds -Replace bad fats with good fats - good fats include: fish, nuts and seeds, canola oil, olive oil -small amounts of low fat or non fat dairy -small amounts of100 % whole grains - check the lables -drink plenty of water  AVOID: -SUGAR, sweets, anything with added sugar, corn syrup or sweeteners - must read labels as even foods advertised as "healthy" often are loaded with sugar -if you must have a sweetener, small amounts of stevia may be best -sweetened beverages and artificially sweetened beverages -simple starches (rice, bread, potatoes, pasta, chips, etc - small amounts of 100% whole grains are ok) -red meat, pork, butter -fried foods, fast food, processed food, excessive dairy, eggs and coconut.  3)Get at least 150 minutes of sweaty aerobic exercise per week.  4)Reduce stress -  consider counseling, meditation and relaxation to balance other aspects of your life.          Lucretia Kern, DO

## 2017-07-06 ENCOUNTER — Ambulatory Visit
Admission: RE | Admit: 2017-07-06 | Discharge: 2017-07-06 | Disposition: A | Discharge status: Home / Self Care | Payer: Medicare HMO | Source: Ambulatory Visit | Attending: Radiation Oncology | Admitting: Radiation Oncology

## 2017-07-06 DIAGNOSIS — Z170 Estrogen receptor positive status [ER+]: Secondary | ICD-10-CM | POA: Diagnosis not present

## 2017-07-06 DIAGNOSIS — C50212 Malignant neoplasm of upper-inner quadrant of left female breast: Secondary | ICD-10-CM | POA: Diagnosis not present

## 2017-07-06 DIAGNOSIS — Z510 Encounter for antineoplastic radiation therapy: Secondary | ICD-10-CM | POA: Diagnosis not present

## 2017-07-09 ENCOUNTER — Ambulatory Visit
Admission: RE | Admit: 2017-07-09 | Discharge: 2017-07-09 | Disposition: A | Discharge status: Home / Self Care | Payer: Medicare HMO | Source: Ambulatory Visit | Attending: Radiation Oncology | Admitting: Radiation Oncology

## 2017-07-09 ENCOUNTER — Encounter: Payer: Self-pay | Admitting: Family Medicine

## 2017-07-09 ENCOUNTER — Ambulatory Visit (INDEPENDENT_AMBULATORY_CARE_PROVIDER_SITE_OTHER): Payer: Medicare HMO | Admitting: Family Medicine

## 2017-07-09 VITALS — BP 116/70 | HR 69 | Temp 97.8°F | Ht 63.5 in | Wt 173.8 lb

## 2017-07-09 DIAGNOSIS — I10 Essential (primary) hypertension: Secondary | ICD-10-CM

## 2017-07-09 DIAGNOSIS — C50919 Malignant neoplasm of unspecified site of unspecified female breast: Secondary | ICD-10-CM

## 2017-07-09 DIAGNOSIS — R05 Cough: Secondary | ICD-10-CM | POA: Diagnosis not present

## 2017-07-09 DIAGNOSIS — R739 Hyperglycemia, unspecified: Secondary | ICD-10-CM

## 2017-07-09 DIAGNOSIS — Z510 Encounter for antineoplastic radiation therapy: Secondary | ICD-10-CM | POA: Diagnosis not present

## 2017-07-09 DIAGNOSIS — Z170 Estrogen receptor positive status [ER+]: Secondary | ICD-10-CM | POA: Diagnosis not present

## 2017-07-09 DIAGNOSIS — C50212 Malignant neoplasm of upper-inner quadrant of left female breast: Secondary | ICD-10-CM | POA: Diagnosis not present

## 2017-07-09 DIAGNOSIS — R059 Cough, unspecified: Secondary | ICD-10-CM

## 2017-07-09 LAB — HEMOGLOBIN A1C: HEMOGLOBIN A1C: 6.4 % (ref 4.6–6.5)

## 2017-07-09 NOTE — Patient Instructions (Addendum)
BEFORE YOU LEAVE: -lab -follow up: 3-4 months for AWV with Manuela Schwartz if due and follow up with Dr. Maudie Mercury  We have ordered labs or studies at this visit. It can take up to 1-2 weeks for results and processing. IF results require follow up or explanation, we will call you with instructions. Clinically stable results will be released to your [facility]. If you have not heard from [place] or cannot find your results in [facility] in 2 weeks please contact our office at [phone].  If you are not yet signed up for [facility], please consider signing up.  Try the allergy medications for the cough - zyrtec or claritin and flonase if ok with your oncology  We recommend the following healthy lifestyle for LIFE: 1) Small portions. But, make sure to get regular (at least 3 per day), healthy meals and small healthy snacks if needed.  2) Eat a healthy clean diet.   TRY TO EAT: -at least 5-7 servings of low sugar, colorful, and nutrient rich vegetables per day (not corn, potatoes or bananas.) -berries are the best choice if you wish to eat fruit (only eat small amounts if trying to reduce weight)  -lean meets (fish, white meat of chicken or [place]) -vegan proteins for some meals - beans or tofu, whole grains, nuts and seeds -Replace bad fats with good fats - good fats include: fish, nuts and seeds, canola oil, olive oil -small amounts of low fat or non fat dairy -small amounts of100 % whole grains - check the lables -drink plenty of water  AVOID: -SUGAR, sweets, anything with added sugar, corn syrup or sweeteners - must read labels as even foods advertised as "healthy" often are loaded with sugar -if you must have a sweetener, small amounts of stevia may be best -sweetened beverages and artificially sweetened beverages -simple starches (rice, bread, potatoes, pasta, chips, etc - small amounts of 100% whole grains are ok) -red meat, pork, butter -fried foods, fast food, processed food, excessive dairy, eggs and  coconut.  3)Get at least 150 minutes of sweaty aerobic exercise per week.  4)Reduce stress - consider counseling, meditation and relaxation to balance other aspects of your life.

## 2017-07-10 ENCOUNTER — Ambulatory Visit
Admission: RE | Admit: 2017-07-10 | Discharge: 2017-07-10 | Disposition: A | Discharge status: Home / Self Care | Payer: Medicare HMO | Source: Ambulatory Visit | Attending: Radiation Oncology | Admitting: Radiation Oncology

## 2017-07-10 DIAGNOSIS — Z510 Encounter for antineoplastic radiation therapy: Secondary | ICD-10-CM | POA: Diagnosis not present

## 2017-07-10 DIAGNOSIS — Z170 Estrogen receptor positive status [ER+]: Secondary | ICD-10-CM | POA: Diagnosis not present

## 2017-07-10 DIAGNOSIS — C50212 Malignant neoplasm of upper-inner quadrant of left female breast: Secondary | ICD-10-CM | POA: Diagnosis not present

## 2017-07-11 ENCOUNTER — Ambulatory Visit
Admission: RE | Admit: 2017-07-11 | Discharge: 2017-07-11 | Disposition: A | Discharge status: Home / Self Care | Payer: Medicare HMO | Source: Ambulatory Visit | Attending: Radiation Oncology | Admitting: Radiation Oncology

## 2017-07-11 DIAGNOSIS — Z510 Encounter for antineoplastic radiation therapy: Secondary | ICD-10-CM | POA: Diagnosis not present

## 2017-07-11 DIAGNOSIS — Z170 Estrogen receptor positive status [ER+]: Secondary | ICD-10-CM | POA: Diagnosis not present

## 2017-07-11 DIAGNOSIS — C50212 Malignant neoplasm of upper-inner quadrant of left female breast: Secondary | ICD-10-CM | POA: Diagnosis not present

## 2017-07-12 ENCOUNTER — Ambulatory Visit
Admission: RE | Admit: 2017-07-12 | Discharge: 2017-07-12 | Disposition: A | Discharge status: Home / Self Care | Payer: Medicare HMO | Source: Ambulatory Visit | Attending: Radiation Oncology | Admitting: Radiation Oncology

## 2017-07-12 DIAGNOSIS — C50212 Malignant neoplasm of upper-inner quadrant of left female breast: Secondary | ICD-10-CM | POA: Diagnosis not present

## 2017-07-12 DIAGNOSIS — Z170 Estrogen receptor positive status [ER+]: Secondary | ICD-10-CM | POA: Diagnosis not present

## 2017-07-12 DIAGNOSIS — Z510 Encounter for antineoplastic radiation therapy: Secondary | ICD-10-CM | POA: Diagnosis not present

## 2017-07-13 ENCOUNTER — Ambulatory Visit: Payer: Medicare HMO | Admitting: Radiation Oncology

## 2017-07-13 ENCOUNTER — Ambulatory Visit
Admission: RE | Admit: 2017-07-13 | Discharge: 2017-07-13 | Disposition: A | Discharge status: Home / Self Care | Payer: Medicare HMO | Source: Ambulatory Visit | Attending: Radiation Oncology | Admitting: Radiation Oncology

## 2017-07-13 DIAGNOSIS — Z510 Encounter for antineoplastic radiation therapy: Secondary | ICD-10-CM | POA: Diagnosis not present

## 2017-07-13 DIAGNOSIS — Z170 Estrogen receptor positive status [ER+]: Secondary | ICD-10-CM | POA: Diagnosis not present

## 2017-07-13 DIAGNOSIS — C50212 Malignant neoplasm of upper-inner quadrant of left female breast: Secondary | ICD-10-CM | POA: Diagnosis not present

## 2017-07-16 ENCOUNTER — Ambulatory Visit
Admission: RE | Admit: 2017-07-16 | Discharge: 2017-07-16 | Disposition: A | Discharge status: Home / Self Care | Payer: Medicare HMO | Source: Ambulatory Visit | Attending: Radiation Oncology | Admitting: Radiation Oncology

## 2017-07-16 DIAGNOSIS — C50212 Malignant neoplasm of upper-inner quadrant of left female breast: Secondary | ICD-10-CM | POA: Diagnosis not present

## 2017-07-16 DIAGNOSIS — Z170 Estrogen receptor positive status [ER+]: Secondary | ICD-10-CM | POA: Insufficient documentation

## 2017-07-16 DIAGNOSIS — Z510 Encounter for antineoplastic radiation therapy: Secondary | ICD-10-CM | POA: Insufficient documentation

## 2017-07-17 ENCOUNTER — Ambulatory Visit
Admission: RE | Admit: 2017-07-17 | Discharge: 2017-07-17 | Disposition: A | Discharge status: Home / Self Care | Payer: Medicare HMO | Source: Ambulatory Visit | Attending: Radiation Oncology | Admitting: Radiation Oncology

## 2017-07-17 DIAGNOSIS — Z510 Encounter for antineoplastic radiation therapy: Secondary | ICD-10-CM | POA: Diagnosis not present

## 2017-07-17 DIAGNOSIS — C50212 Malignant neoplasm of upper-inner quadrant of left female breast: Secondary | ICD-10-CM | POA: Diagnosis not present

## 2017-07-18 ENCOUNTER — Ambulatory Visit
Admission: RE | Admit: 2017-07-18 | Discharge: 2017-07-18 | Disposition: A | Discharge status: Home / Self Care | Payer: Medicare HMO | Source: Ambulatory Visit | Attending: Radiation Oncology | Admitting: Radiation Oncology

## 2017-07-18 DIAGNOSIS — C50212 Malignant neoplasm of upper-inner quadrant of left female breast: Secondary | ICD-10-CM | POA: Diagnosis not present

## 2017-07-18 DIAGNOSIS — Z510 Encounter for antineoplastic radiation therapy: Secondary | ICD-10-CM | POA: Diagnosis not present

## 2017-07-20 ENCOUNTER — Ambulatory Visit
Admission: RE | Admit: 2017-07-20 | Discharge: 2017-07-20 | Disposition: A | Discharge status: Home / Self Care | Payer: Medicare HMO | Source: Ambulatory Visit | Attending: Radiation Oncology | Admitting: Radiation Oncology

## 2017-07-20 DIAGNOSIS — Z510 Encounter for antineoplastic radiation therapy: Secondary | ICD-10-CM | POA: Diagnosis not present

## 2017-07-20 DIAGNOSIS — C50212 Malignant neoplasm of upper-inner quadrant of left female breast: Secondary | ICD-10-CM | POA: Diagnosis not present

## 2017-07-23 ENCOUNTER — Ambulatory Visit
Admission: RE | Admit: 2017-07-23 | Discharge: 2017-07-23 | Disposition: A | Discharge status: Home / Self Care | Payer: Medicare HMO | Source: Ambulatory Visit | Attending: Radiation Oncology | Admitting: Radiation Oncology

## 2017-07-23 ENCOUNTER — Inpatient Hospital Stay: Payer: Medicare HMO | Attending: Hematology and Oncology | Admitting: Hematology and Oncology

## 2017-07-23 ENCOUNTER — Telehealth: Payer: Self-pay | Admitting: Hematology and Oncology

## 2017-07-23 DIAGNOSIS — Z510 Encounter for antineoplastic radiation therapy: Secondary | ICD-10-CM | POA: Diagnosis not present

## 2017-07-23 DIAGNOSIS — Z170 Estrogen receptor positive status [ER+]: Secondary | ICD-10-CM

## 2017-07-23 DIAGNOSIS — C50212 Malignant neoplasm of upper-inner quadrant of left female breast: Secondary | ICD-10-CM | POA: Diagnosis not present

## 2017-07-23 MED ORDER — LETROZOLE 2.5 MG PO TABS: 2.5000 mg | ORAL_TABLET | Freq: Every day | ORAL | 3 refills | Status: AC

## 2017-07-23 NOTE — Assessment & Plan Note (Signed)
05/24/2017: Left lumpectomy: IDC grade 1, 1.8 cm, resection margins negative, ER 100%, PR 60%, Ki-67 10%, HER-2 negative ratio 1.4, T1c N0 stage I a  Oncotype DX score 19: Risk of recurrence 6% at 9 years Adjuvant radiation therapy 06/26/2017 to 07/23/2017  Treatment plan: Adjuvant antiestrogen therapy with letrozole 2.5 mg daily x5 to 7 years  Letrozole counseling: We discussed the risks and benefits of anti-estrogen therapy with aromatase inhibitors. These include but not limited to insomnia, hot flashes, mood changes, vaginal dryness, bone density loss, and weight gain. We strongly believe that the benefits far outweigh the risks. Patient understands these risks and consented to starting treatment. Planned treatment duration is 5-7 years.   Return to clinic in 3 months for survivorship care plan visit

## 2017-07-23 NOTE — Progress Notes (Signed)
Patient Care Team: Lucretia Kern, DO as PCP - General (Family Medicine) Rolm Bookbinder, MD as Consulting Physician (General Surgery) Nicholas Lose, MD as Consulting Physician (Hematology and Oncology) Kyung Rudd, MD as Consulting Physician (Radiation Oncology)  DIAGNOSIS:  Encounter Diagnosis  Name Primary?  . Malignant neoplasm of upper-inner quadrant of left breast in female, estrogen receptor positive ([place])     SUMMARY OF ONCOLOGIC HISTORY:   Malignant neoplasm of upper-inner quadrant of left breast in female, estrogen receptor positive ([place])   05/04/2017 Initial Diagnosis    Screening detected left breast mass at 11:30 position: 0.4 cm, axilla negative, biopsy revealed grade 1 IDC ER 100%, PR 60%, Ki-67 10%, HER-2 negative ratio 1.4, T1 a N0 stage I a clinical stage      05/24/2017 Surgery    Left lumpectomy: IDC grade 1, 1.8 cm, resection margins negative, ER 100%, PR 60%, Ki-67 10%, HER-2 negative ratio 1.4, T1c N0 stage I a      06/07/2017 Oncotype testing    Recurrence score: 19, distant recurrence risk at 9 years is at 6%      06/27/2017 - 07/23/2017 Radiation Therapy    Adjuvant radiation therapy       CHIEF COMPLIANT: Follow-up of breast cancer to its end of radiation  INTERVAL HISTORY: Cassandra James is a 72 year old with above-mentioned history of left breast cancer is currently on adjuvant radiation therapy.  She finishes radiation this Wednesday.  She has not had any problems from radiation therapy.  Very mild redness.  REVIEW OF SYSTEMS:   Constitutional: Denies fevers, chills or abnormal weight loss Eyes: Denies blurriness of vision Ears, nose, mouth, throat, and face: Denies mucositis or sore throat Respiratory: Denies cough, dyspnea or wheezes Cardiovascular: Denies palpitation, chest discomfort Gastrointestinal:  Denies nausea, heartburn or change in bowel habits Skin: Denies abnormal skin rashes Lymphatics: Denies new lymphadenopathy or easy  bruising Neurological:Denies numbness, tingling or new weaknesses Behavioral/Psych: Mood is stable, no new changes  Extremities: No lower extremity edema Breast: Very mild radiation dermatitis All other systems were reviewed with the patient and are negative.  I have reviewed the past medical history, past surgical history, social history and family history with the patient and they are unchanged from previous note.  ALLERGIES:  is allergic to cephalexin; cephalosporins; codeine; and tessalon [benzonatate].  MEDICATIONS:  Current Outpatient Medications  Medication Sig Dispense Refill  . albuterol (PROAIR HFA) 108 (90 Base) MCG/ACT inhaler 2 puffs every 4 hours as needed only  if your can't catch your breath (Patient taking differently: Inhale 1 puff into the lungs every 4 (four) hours as needed for wheezing or shortness of breath. ) 1 Inhaler 11  . cholecalciferol (VITAMIN D) 1000 units tablet Take 1,000 Units by mouth daily.    . cyanocobalamin (,VITAMIN B-12,) 1000 MCG/ML injection INJECT 1ML INTO THE MUSCLE EVERY 30 DAYS (Patient taking differently: Inject 1,000 mcg into the muscle every 30 (thirty) days. ) 12 mL 3  . hydrochlorothiazide (HYDRODIURIL) 25 MG tablet TAKE 1 TABLET BY MOUTH EVERY DAY 90 tablet 1  . hyoscyamine (LEVSIN SL) 0.125 MG SL tablet Place 2 tablets (0.25 mg total) under the tongue every 4 (four) hours as needed for cramping. 90 tablet 0  . naproxen sodium (ALEVE) 220 MG tablet Take 220 mg by mouth 2 (two) times daily as needed (for pain or headache).    . Omega-3 Fatty Acids (FISH OIL) 1200 MG CAPS Take 1,200 mg by mouth daily.    Marland Kitchen  potassium chloride SA (KLOR-CON M20) 20 MEQ tablet Take 1 tablet (20 mEq total) by mouth daily. 90 tablet 1  . rosuvastatin (CRESTOR) 10 MG tablet Take 1 tablet (10 mg total) by mouth daily. 90 tablet 3  . telmisartan (MICARDIS) 40 MG tablet Take 1 tablet (40 mg total) by mouth daily. 90 tablet 1   No current facility-administered  medications for this visit.     PHYSICAL EXAMINATION: ECOG PERFORMANCE STATUS: 1 - Symptomatic but completely ambulatory  Vitals:   07/23/17 0914  BP: (!) 123/53  Pulse: 71  Resp: 17  Temp: 98.1 F (36.7 C)  SpO2: 94%   Filed Weights   07/23/17 0914  Weight: 177 lb 1.6 oz (80.3 kg)    GENERAL:alert, no distress and comfortable SKIN: skin color, texture, turgor are normal, no rashes or significant lesions EYES: normal, Conjunctiva are pink and non-injected, sclera clear OROPHARYNX:no exudate, no erythema and lips, buccal mucosa, and tongue normal  NECK: supple, thyroid normal size, non-tender, without nodularity LYMPH:  no palpable lymphadenopathy in the cervical, axillary or inguinal LUNGS: clear to auscultation and percussion with normal breathing effort HEART: regular rate & rhythm and no murmurs and no lower extremity edema ABDOMEN:abdomen soft, non-tender and normal bowel sounds MUSCULOSKELETAL:no cyanosis of digits and no clubbing  NEURO: alert & oriented x 3 with fluent speech, no focal motor/sensory deficits EXTREMITIES: No lower extremity edema    LABORATORY DATA:  I have reviewed the data as listed CMP Latest Ref Rng & Units 05/22/2017 03/27/2017 12/19/2016  Glucose 65 - 99 mg/dL 110(H) 93 122(H)  BUN 6 - 20 mg/dL 16 17 17   Creatinine 0.44 - 1.00 mg/dL 0.74 0.65 0.76  Sodium 135 - 145 mmol/L 140 140 140  Potassium 3.5 - 5.1 mmol/L 4.1 4.2 4.4  Chloride 101 - 111 mmol/L 103 99 101  CO2 22 - 32 mmol/L 27 36(H) 31  Calcium 8.9 - 10.3 mg/dL 9.4 9.9 9.3  Total Protein 6.5 - 8.1 g/dL 6.6 - -  Total Bilirubin 0.3 - 1.2 mg/dL 0.6 - -  Alkaline Phos 38 - 126 U/L 64 - -  AST 15 - 41 U/L 16 - -  ALT 14 - 54 U/L 17 - -    Lab Results  Component Value Date   WBC 8.4 05/22/2017   HGB 14.4 05/22/2017   HCT 43.2 05/22/2017   MCV 84.0 05/22/2017   PLT 259 05/22/2017   NEUTROABS 3.1 01/14/2016    ASSESSMENT & PLAN:  Malignant neoplasm of upper-inner quadrant of left  breast in female, estrogen receptor positive ([place]) 05/24/2017: Left lumpectomy: IDC grade 1, 1.8 cm, resection margins negative, ER 100%, PR 60%, Ki-67 10%, HER-2 negative ratio 1.4, T1c N0 stage I a  Oncotype DX score 19: Risk of recurrence 6% at 9 years Adjuvant radiation therapy 06/26/2017 to 07/23/2017  Treatment plan: Adjuvant antiestrogen therapy with letrozole 2.5 mg daily x5 to 7 years  Letrozole counseling: We discussed the risks and benefits of anti-estrogen therapy with aromatase inhibitors. These include but not limited to insomnia, hot flashes, mood changes, vaginal dryness, bone density loss, and weight gain. We strongly believe that the benefits far outweigh the risks. Patient understands these risks and consented to starting treatment. Planned treatment duration is 5-7 years.   Return to clinic in 3 months for survivorship care plan visit  No orders of the defined types were placed in this encounter.  The patient has a good understanding of the overall plan. she agrees with  it. she will call with any problems that may develop before the next visit here.   Harriette Ohara, MD 07/23/17

## 2017-07-23 NOTE — Telephone Encounter (Signed)
Gave patient avs and calendar of upcoming October appts.

## 2017-07-24 ENCOUNTER — Ambulatory Visit
Admission: RE | Admit: 2017-07-24 | Discharge: 2017-07-24 | Disposition: A | Discharge status: Home / Self Care | Payer: Medicare HMO | Source: Ambulatory Visit | Attending: Radiation Oncology | Admitting: Radiation Oncology

## 2017-07-24 DIAGNOSIS — Z510 Encounter for antineoplastic radiation therapy: Secondary | ICD-10-CM | POA: Diagnosis not present

## 2017-07-24 DIAGNOSIS — C50212 Malignant neoplasm of upper-inner quadrant of left female breast: Secondary | ICD-10-CM | POA: Diagnosis not present

## 2017-07-25 ENCOUNTER — Encounter: Payer: Self-pay | Admitting: Radiation Oncology

## 2017-07-25 ENCOUNTER — Ambulatory Visit
Admission: RE | Admit: 2017-07-25 | Discharge: 2017-07-25 | Disposition: A | Discharge status: Home / Self Care | Payer: Medicare HMO | Source: Ambulatory Visit | Attending: Radiation Oncology | Admitting: Radiation Oncology

## 2017-07-25 DIAGNOSIS — Z510 Encounter for antineoplastic radiation therapy: Secondary | ICD-10-CM | POA: Diagnosis not present

## 2017-07-25 DIAGNOSIS — C50212 Malignant neoplasm of upper-inner quadrant of left female breast: Secondary | ICD-10-CM | POA: Diagnosis not present

## 2017-07-30 DIAGNOSIS — R69 Illness, unspecified: Secondary | ICD-10-CM | POA: Diagnosis not present

## 2017-08-06 DIAGNOSIS — N958 Other specified menopausal and perimenopausal disorders: Secondary | ICD-10-CM | POA: Diagnosis not present

## 2017-08-06 DIAGNOSIS — M8588 Other specified disorders of bone density and structure, other site: Secondary | ICD-10-CM | POA: Diagnosis not present

## 2017-08-08 NOTE — Progress Notes (Signed)
  Radiation Oncology         [phone]) [phone] ________________________________  Name: Cassandra James MRN: [id number]  Date: 07/25/2017  DOB: 1945/12/22  End of Treatment Note  Diagnosis:   72 y.o. 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 This was delivered using a 3-D conformal technique. The patient then received a boost to the seroma. This delivered an additional 8 Gy in 4 fractions using a 3 field photon technique due to the depth of the seroma. The total dose was 50.56 Gy.  Narrative: The patient tolerated radiation treatment relatively well.  The patient had some expected skin irritation as she progressed during treatment. Moist desquamation was not present at the end of treatment.  Plan: The patient has completed radiation treatment. The patient will return to radiation oncology clinic for routine followup in one month. I advised the patient to call or return sooner if they have any questions or concerns related to their recovery or treatment. ________________________________  Jodelle Gross, MD, PhD  This document serves as a record of services personally performed by Kyung Rudd, MD. It was created on his behalf by Rae Lips, a trained medical scribe. The creation of this record is based on the scribe's personal observations and the provider's statements to them. This document has been checked and approved by the attending provider.

## 2017-08-25 ENCOUNTER — Other Ambulatory Visit: Payer: Self-pay | Admitting: Family Medicine

## 2017-08-29 ENCOUNTER — Other Ambulatory Visit: Payer: Self-pay

## 2017-08-29 ENCOUNTER — Encounter: Payer: Self-pay | Admitting: Radiation Oncology

## 2017-08-29 ENCOUNTER — Ambulatory Visit
Admission: RE | Admit: 2017-08-29 | Discharge: 2017-08-29 | Disposition: A | Discharge status: Home / Self Care | Payer: Medicare HMO | Source: Ambulatory Visit | Attending: Radiation Oncology | Admitting: Radiation Oncology

## 2017-08-29 VITALS — BP 133/68 | HR 64 | Temp 98.3°F | Resp 18 | Ht 63.5 in | Wt 174.8 lb

## 2017-08-29 DIAGNOSIS — Z79899 Other long term (current) drug therapy: Secondary | ICD-10-CM | POA: Insufficient documentation

## 2017-08-29 DIAGNOSIS — Z170 Estrogen receptor positive status [ER+]: Secondary | ICD-10-CM | POA: Diagnosis not present

## 2017-08-29 DIAGNOSIS — D0512 Intraductal carcinoma in situ of left breast: Secondary | ICD-10-CM | POA: Diagnosis not present

## 2017-08-29 DIAGNOSIS — Z881 Allergy status to other antibiotic agents status: Secondary | ICD-10-CM | POA: Diagnosis not present

## 2017-08-29 DIAGNOSIS — Z885 Allergy status to narcotic agent status: Secondary | ICD-10-CM | POA: Diagnosis not present

## 2017-08-29 DIAGNOSIS — C50212 Malignant neoplasm of upper-inner quadrant of left female breast: Secondary | ICD-10-CM

## 2017-08-29 NOTE — Progress Notes (Signed)
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 This was delivered using a 3-D conformal technique. The patient then received a boost to the seroma. This delivered an additional 8 Gy in 4 fractions using a 3 field photon technique due to the depth of the seroma. The total dose was 50.56 Gy.  Narrative:  The patient returns today for routine follow-up. During treatment she did very well with radiotherapy and did not have significant desquamation.                             On review of systems, the patient states she is doing well and reports her skin is almost completely back to normal complexion. She denies any concerns with her antiestrogen therapy she recently started.  ALLERGIES:  is allergic to cephalexin; cephalosporins; codeine; and tessalon [benzonatate].  Meds: Current Outpatient Medications  Medication Sig Dispense Refill  . albuterol (PROAIR HFA) 108 (90 Base) MCG/ACT inhaler 2 puffs every 4 hours as needed only  if your can't catch your breath (Patient taking differently: Inhale 1 puff into the lungs every 4 (four) hours as needed for wheezing or shortness of breath. ) 1 Inhaler 11  . cholecalciferol (VITAMIN D) 1000 units tablet Take 1,000 Units by mouth daily.    . cyanocobalamin (,VITAMIN B-12,) 1000 MCG/ML injection INJECT 1ML INTO THE MUSCLE EVERY 30 DAYS (Patient taking differently: Inject 1,000 mcg into the muscle every 30 (thirty) days. ) 12 mL 3  . hydrochlorothiazide (HYDRODIURIL) 25 MG tablet  TAKE 1 TABLET BY MOUTH EVERY DAY 90 tablet 1  . hyoscyamine (LEVSIN SL) 0.125 MG SL tablet Place 2 tablets (0.25 mg total) under the tongue every 4 (four) hours as needed for cramping. 90 tablet 0  . letrozole (FEMARA) 2.5 MG tablet Take 1 tablet (2.5 mg total) by mouth daily. 90 tablet 3  . naproxen sodium (ALEVE) 220 MG tablet Take 220 mg by mouth 2 (two) times daily as needed (for pain or headache).    . Omega-3 Fatty Acids (FISH OIL) 1200 MG CAPS Take 1,200 mg by mouth daily.    . potassium chloride SA (KLOR-CON M20) 20 MEQ tablet Take 1 tablet (20 mEq total) by mouth daily. 90 tablet 1  . rosuvastatin (CRESTOR) 10 MG tablet Take 1 tablet (10 mg total) by mouth daily. 90 tablet 3  . telmisartan (MICARDIS) 40 MG tablet TAKE 1 TABLET BY MOUTH EVERY DAY 90 tablet 1   No current facility-administered medications for this encounter.     Physical Findings:  height is 5' 3.5" (1.613 m) and weight is 174 lb 12.8 oz (79.3 kg). Her temperature is 98.3 F (36.8 C). Her blood pressure is 133/68 and her pulse is 64. Her respiration is 18 and oxygen saturation is 96%.  Pain Assessment Pain Score: 0-No pain/10 In general this is a well appearing Caucasian female in no acute distress. She's alert and oriented x4 and appropriate throughout the examination. Cardiopulmonary  assessment is negative for acute distress and she exhibits normal effort. The left breast was examined and reveals mild hyperpigmentation without desquamation.   Lab Findings: Lab Results  Component Value Date   WBC 8.4 05/22/2017   HGB 14.4 05/22/2017   HCT 43.2 05/22/2017   MCV 84.0 05/22/2017   PLT 259 05/22/2017     Radiographic Findings: No results found.  Impression/Plan: 1. 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 The patient has been doing well since completion of radiotherapy. We discussed that we would be happy to continue to follow her as needed, but she will  also continue to follow up with Dr. Lindi Adie in medical oncology. She will be relocating to [place] soon as well and welcomes Dr. Geralyn Flash recommendation for a provider near [facility] she could be followed by long term She was counseled on skin care as well as measures to avoid sun exposure to this area.  2. Survivorship. We discussed the importance of survivorship evaluation and she is currently scheduled for this in the near future. She was also given the monthly calendar for access to resources offered within the cancer center.     Carola Rhine, PAC

## 2017-09-17 ENCOUNTER — Encounter: Payer: Self-pay | Admitting: Family Medicine

## 2017-09-18 MED ORDER — HYOSCYAMINE SULFATE 0.125 MG SL SUBL: 0.2500 mg | SUBLINGUAL_TABLET | SUBLINGUAL | 0 refills | Status: AC | PRN

## 2017-09-20 ENCOUNTER — Ambulatory Visit: Payer: Self-pay | Admitting: Radiation Oncology

## 2017-10-09 ENCOUNTER — Ambulatory Visit: Payer: Medicare HMO | Admitting: Family Medicine

## 2017-10-16 ENCOUNTER — Ambulatory Visit: Payer: Medicare HMO

## 2017-10-17 ENCOUNTER — Telehealth: Payer: Self-pay

## 2017-10-17 NOTE — Telephone Encounter (Signed)
Spoke with pt reminding of SCP visit with NP on 10/23/17 at 10 am.  Pt says she will come to appt.

## 2017-10-23 ENCOUNTER — Ambulatory Visit: Payer: Medicare HMO | Admitting: Family Medicine

## 2017-10-23 ENCOUNTER — Telehealth: Payer: Self-pay | Admitting: Hematology and Oncology

## 2017-10-23 ENCOUNTER — Inpatient Hospital Stay: Payer: Medicare HMO | Attending: Hematology and Oncology | Admitting: Adult Health

## 2017-10-23 ENCOUNTER — Encounter: Payer: Self-pay | Admitting: Adult Health

## 2017-10-23 ENCOUNTER — Telehealth: Payer: Self-pay | Admitting: Adult Health

## 2017-10-23 ENCOUNTER — Ambulatory Visit (INDEPENDENT_AMBULATORY_CARE_PROVIDER_SITE_OTHER): Payer: Medicare HMO | Admitting: Family Medicine

## 2017-10-23 ENCOUNTER — Encounter: Payer: Self-pay | Admitting: Family Medicine

## 2017-10-23 VITALS — BP 146/61 | HR 64 | Temp 97.7°F | Resp 18 | Ht 63.5 in | Wt 179.9 lb

## 2017-10-23 VITALS — BP 112/80 | HR 64 | Temp 97.9°F | Ht 63.5 in | Wt 179.5 lb

## 2017-10-23 DIAGNOSIS — Z79811 Long term (current) use of aromatase inhibitors: Secondary | ICD-10-CM | POA: Diagnosis not present

## 2017-10-23 DIAGNOSIS — E785 Hyperlipidemia, unspecified: Secondary | ICD-10-CM

## 2017-10-23 DIAGNOSIS — Z87891 Personal history of nicotine dependence: Secondary | ICD-10-CM | POA: Insufficient documentation

## 2017-10-23 DIAGNOSIS — M8580 Other specified disorders of bone density and structure, unspecified site: Secondary | ICD-10-CM | POA: Insufficient documentation

## 2017-10-23 DIAGNOSIS — Z23 Encounter for immunization: Secondary | ICD-10-CM

## 2017-10-23 DIAGNOSIS — R739 Hyperglycemia, unspecified: Secondary | ICD-10-CM

## 2017-10-23 DIAGNOSIS — I10 Essential (primary) hypertension: Secondary | ICD-10-CM

## 2017-10-23 DIAGNOSIS — Z170 Estrogen receptor positive status [ER+]: Secondary | ICD-10-CM | POA: Diagnosis not present

## 2017-10-23 DIAGNOSIS — Z923 Personal history of irradiation: Secondary | ICD-10-CM | POA: Diagnosis not present

## 2017-10-23 DIAGNOSIS — C50212 Malignant neoplasm of upper-inner quadrant of left female breast: Secondary | ICD-10-CM

## 2017-10-23 LAB — BASIC METABOLIC PANEL
BUN: 17 mg/dL (ref 6–23)
CHLORIDE: 99 meq/L (ref 96–112)
CO2: 32 meq/L (ref 19–32)
Calcium: 9.7 mg/dL (ref 8.4–10.5)
Creatinine, Ser: 0.76 mg/dL (ref 0.40–1.20)
GFR: 79.03 mL/min (ref >60.00)
Glucose, Bld: 104 mg/dL — ABNORMAL HIGH (ref 70–99)
Potassium: 4.6 mEq/L (ref 3.5–5.1)
Sodium: 140 mEq/L (ref 135–145)

## 2017-10-23 LAB — CBC
HEMATOCRIT: 43.2 % (ref 36.0–46.0)
HEMOGLOBIN: 14.3 g/dL (ref 12.0–15.0)
MCHC: 33.2 g/dL (ref 30.0–36.0)
MCV: 84.6 fl (ref 78.0–100.0)
Platelets: 253 10*3/uL (ref 150.0–400.0)
RBC: 5.1 Mil/uL (ref 3.87–5.11)
RDW: 13.4 % (ref 11.5–15.5)
WBC: 7.9 10*3/uL (ref 4.0–10.5)

## 2017-10-23 MED ORDER — TELMISARTAN 40 MG PO TABS: 40.0000 mg | ORAL_TABLET | Freq: Every day | ORAL | 1 refills | Status: AC

## 2017-10-23 MED ORDER — ALBUTEROL SULFATE HFA 108 (90 BASE) MCG/ACT IN AERS: INHALATION_SPRAY | RESPIRATORY_TRACT | 1 refills | Status: DC

## 2017-10-23 NOTE — Progress Notes (Signed)
CLINIC:  Survivorship   REASON FOR VISIT:  Routine follow-up post-treatment for a recent history of breast cancer.  BRIEF ONCOLOGIC HISTORY:    Malignant neoplasm of upper-inner quadrant of left breast in female, estrogen receptor positive ([place])   05/04/2017 Initial Diagnosis    Screening detected left breast mass at 11:30 position: 0.4 cm, axilla negative, biopsy revealed grade 1 IDC ER 100%, PR 60%, Ki-67 10%, HER-2 negative ratio 1.4, T1 a N0 stage I a clinical stage    05/24/2017 Surgery    Left lumpectomy: IDC grade 1, 1.8 cm, resection margins negative, ER 100%, PR 60%, Ki-67 10%, HER-2 negative ratio 1.4, T1c N0 stage I a    06/07/2017 Oncotype testing    Recurrence score: 19, distant recurrence risk at 9 years is at 6%    06/27/2017 - 07/23/2017 Radiation Therapy    Adjuvant radiation therapy    08/2017 -  Anti-estrogen oral therapy    Letrozole daily     INTERVAL HISTORY:  Cassandra James presents to the [facility] today for our initial meeting to review her survivorship care plan detailing her treatment course for breast cancer, as well as monitoring long-term side effects of that treatment, education regarding health maintenance, screening, and overall wellness and health promotion.     Overall, Cassandra James reports feeling quite well.  Cassandra James is taking Letrozole daily.  She is tolerating this well. She denies hot flashes, arthralgias, vaginal dryness.  Cassandra James notes that she is moving out of town in the next month and wants to know what to do as far as transferring records, when she should be seen by oncology, etc.      REVIEW OF SYSTEMS:  Review of Systems  Constitutional: Negative for appetite change, chills, fatigue, fever and unexpected weight change.  HENT:   Negative for hearing loss, lump/mass and trouble swallowing.   Eyes: Negative for eye problems and icterus.  Respiratory: Negative for chest tightness, cough and shortness of breath.   Cardiovascular:  Negative for chest pain, leg swelling and palpitations.  Gastrointestinal: Negative for abdominal distention, abdominal pain, constipation, diarrhea, nausea and vomiting.  Endocrine: Negative for hot flashes.  Musculoskeletal: Negative for arthralgias.  Skin: Negative for itching and rash.  Neurological: Negative for dizziness, extremity weakness and numbness.  Hematological: Negative for adenopathy. Does not bruise/bleed easily.  Psychiatric/Behavioral: Negative for depression. The patient is not nervous/anxious.   Breast: Denies any new nodularity, masses, tenderness, nipple changes, or nipple discharge.      ONCOLOGY TREATMENT TEAM:  1. Surgeon:  Dr. Donne Hazel at [facility] 2. Medical Oncologist: Dr. Lindi Adie  3. Radiation Oncologist: Dr. Lisbeth Renshaw    PAST MEDICAL/SURGICAL HISTORY:  Past Medical History:  Diagnosis Date  . Cancer (HCC)    LT BREAST  . Cataract   . Colon polyps    history of  . COLONIC POLYPS, HX OF 08/09/2006   Qualifier: Diagnosis of  By: Scherrie Gerlach    . COPD (chronic obstructive pulmonary disease) (HCC)    BORDERLINE  SEES DR. Melvyn Novas PRN  . DECREASED HEARING 11/25/2008   Qualifier: Diagnosis of  By: Scherrie Gerlach    . Dyspnea    W/ EXERTION   . Elevated LFTs    obstructive disease  . 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 HIATAL HERNIA 02/05/2009   Qualifier: Diagnosis of  By: Harriet Pho    . Hyperglycemia 11/10/2013  . Hyperlipidemia   .  Hypertension   . Osteoporosis    was osteopenia-last bone scan all clear   . Pernicious anemia   . Pneumonia    2018   Past Surgical History:  Procedure Laterality Date  . APPENDECTOMY  1968   incendental  . BREAST LUMPECTOMY WITH RADIOACTIVE SEED LOCALIZATION Left 05/24/2017   Procedure: BREAST LUMPECTOMY WITH RADIOACTIVE SEED LOCALIZATION;  Surgeon: Rolm Bookbinder, MD;  Location: [place];  Service: General;  Laterality: Left;  . CARPAL TUNNEL RELEASE Left 2016  . CATARACT EXTRACTION W/  INTRAOCULAR LENS  IMPLANT, BILATERAL     2009  . cataract surgery  2011   Bilateral  . COLONOSCOPY    . HAND TENDON SURGERY  01/16/2014   Dr Burney Gauze  . OVARIAN CYST SURGERY  1968  . RETINAL DETACHMENT SURGERY  2011   left  . TOENAIL EXCISION Right    Dr Patrick Jupiter great toenail excision  . TRAPEZIUM RESECTION Left 2016   left wrist   . TUBAL LIGATION  1979     ALLERGIES:  Allergies  Allergen Reactions  . Cephalexin Anaphylaxis  . Cephalosporins Anaphylaxis  . Codeine Anaphylaxis and Other (See Comments)    Patient stated a cough medication caused nausea, itching and decreased BP  . Tessalon [Benzonatate] Nausea Only     CURRENT MEDICATIONS:  Outpatient Encounter Medications as of 10/23/2017  Medication Sig  . albuterol (PROAIR HFA) 108 (90 Base) MCG/ACT inhaler 2 puffs every 4 hours as needed only  if your can't catch your breath (Patient taking differently: Inhale 1 puff into the lungs every 4 (four) hours as needed for wheezing or shortness of breath. )  . cholecalciferol (VITAMIN D) 1000 units tablet Take 1,000 Units by mouth daily.  . cyanocobalamin (,VITAMIN B-12,) 1000 MCG/ML injection INJECT 1ML INTO THE MUSCLE EVERY 30 DAYS (Patient taking differently: Inject 1,000 mcg into the muscle every 30 (thirty) days. )  . hydrochlorothiazide (HYDRODIURIL) 25 MG tablet TAKE 1 TABLET BY MOUTH EVERY DAY  . hyoscyamine (LEVSIN SL) 0.125 MG SL tablet Place 2 tablets (0.25 mg total) under the tongue every 4 (four) hours as needed for cramping.  Marland Kitchen letrozole (FEMARA) 2.5 MG tablet Take 1 tablet (2.5 mg total) by mouth daily.  . naproxen sodium (ALEVE) 220 MG tablet Take 220 mg by mouth 2 (two) times daily as needed (for pain or headache).  . Omega-3 Fatty Acids (FISH OIL) 1200 MG CAPS Take 1,200 mg by mouth daily.  . potassium chloride SA (KLOR-CON M20) 20 MEQ tablet Take 1 tablet (20 mEq total) by mouth daily.  . rosuvastatin (CRESTOR) 10 MG tablet Take 1 tablet (10 mg total) by mouth  daily.  Marland Kitchen telmisartan (MICARDIS) 40 MG tablet TAKE 1 TABLET BY MOUTH EVERY DAY   No facility-administered encounter medications on file as of 10/23/2017.      ONCOLOGIC FAMILY HISTORY:  Family History  Problem Relation Age of Onset  . Pernicious anemia Mother   . Cancer Father        lung/ bone  . Osteoporosis Father   . Hypothyroidism Father   . Colon cancer Sister   . Cancer - Colon Sister   . Pancreatic cancer Maternal Grandfather   . Colon polyps Neg Hx   . Rectal cancer Neg Hx   . Stomach cancer Neg Hx      GENETIC COUNSELING/TESTING: Not at this time  SOCIAL HISTORY:  Social History   Socioeconomic History  . Marital status: Married    Spouse name: Not  on file  . Number of children: Not on file  . Years of education: Not on file  . Highest education level: Not on file  Occupational History  . Not on file  Social Needs  . Financial resource strain: Not on file  . Food insecurity:    Worry: Not on file    Inability: Not on file  . Transportation needs:    Medical: Not on file    Non-medical: Not on file  Tobacco Use  . Smoking status: Former Smoker    Packs/day: 1.00    Types: Cigarettes    Start date: 01/16/1962    Last attempt to quit: 01/17/2000    Years since quitting: 17.7  . Smokeless tobacco: Never Used  . Tobacco comment: Started in 1964 (college)  Substance and Sexual Activity  . Alcohol use: Yes    Alcohol/week: 4.0 - 5.0 standard drinks    Types: 4 - 5 Standard drinks or equivalent per week    Comment: Started in 1964 (college)  . Drug use: No  . Sexual activity: Not on file  Lifestyle  . Physical activity:    Days per week: Not on file    Minutes per session: Not on file  . Stress: Not on file  Relationships  . Social connections:    Talks on phone: Not on file    Gets together: Not on file    Attends religious service: Not on file    Active member of club or organization: Not on file    Attends meetings of clubs or organizations:  Not on file    Relationship status: Not on file  . Intimate partner violence:    Fear of current or ex partner: Not on file    Emotionally abused: Not on file    Physically abused: Not on file    Forced sexual activity: Not on file  Other Topics Concern  . 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  She is accompanied by her husband today.   HEENT: Head is normocephalic.  Pupils equal and reactive to light. Conjunctivae clear without exudate.  Sclerae anicteric. Oral mucosa is pink, moist.  Oropharynx is pink without lesions or erythema.  Lymph: No cervical, supraclavicular, or infraclavicular lymphadenopathy noted on palpation.  Cardiovascular: Regular rate and rhythm.Marland Kitchen Respiratory: Clear to auscultation bilaterally. Chest expansion symmetric; breathing non-labored.  Breasts: right breast benign, left breast s/p lumpectomy and radiation, no sign of recurrence GI: Abdomen soft and round; non-tender, non-distended. Bowel sounds normoactive.  GU: Deferred.  Neuro: No focal deficits. Steady gait.  Psych: Mood and affect normal and appropriate for situation.  Extremities: No edema. MSK: No focal spinal tenderness to palpation.  Full range of motion in bilateral upper extremities Skin: Warm and dry.  LABORATORY DATA:  None for this visit.  DIAGNOSTIC IMAGING:  None for this visit.      ASSESSMENT AND PLAN:  Cassandra James is a pleasant 72 y.o. female with  Stage IA left breast invasive ductal carcinoma, ER+/PR+/HER2-, diagnosed in 04/2017, treated with lumpectomy, adjuvant radiation therapy, and anti-estrogen therapy with Letrozole beginning in 08/2017.  She presents to the Survivorship Clinic for our initial meeting and routine follow-up post-completion of treatment for breast cancer.    1. Stage IA left breast cancer:  Cassandra James is continuing to recover from definitive treatment for breast cancer.  Cassandra James is tolerating her Letrozole well.  She will be due for f/u in 6 months.  She is moving, however, and I recommended she get a referral to oncology once she moves down to [place].  We have assisted her with getting a copy of her records to have for the move.  Today, a comprehensive survivorship care plan and treatment summary was reviewed with the patient today detailing her breast cancer diagnosis, treatment course, potential late/long-term effects of treatment, appropriate follow-up care with recommendations for the future, and patient education resources.  A copy of this summary, along with a letter will be sent to the patient's primary care provider via mail/fax/In Basket message after today's visit.    2. Bone health:  Given Cassandra James's age/history of breast cancer and her current treatment regimen including anti-estrogen therapy with Letrozole, she is at risk for bone demineralization.  Her last DEXA scan was 08/06/2017, which showed osteopenia with a T score of -1.2 in her left and right femur.  In the meantime, she was encouraged to increase her consumption of foods rich in calcium, as well as increase her weight-bearing activities.  She was given education on specific activities to promote bone health.  3. Cancer screening:  Due to Cassandra James's history and her age, she should receive screening for skin cancers, colon cancer.  She is a former smoker, however quit greater than 15 years ago, so therefore does not meet lung cancer screening  criteria.  The information and recommendations are listed on the patient's comprehensive care plan/treatment summary and were reviewed in detail with the patient.    4. Health maintenance and wellness promotion: Cassandra James was encouraged to consume 5-7 servings of fruits and vegetables per day. We reviewed the "Nutrition Rainbow" handout, as well as the handout "Take Control of Your Health and Reduce Your Cancer Risk" from the [employer].  She was also encouraged to engage in moderate to vigorous exercise for 30 minutes per day most days of the week. We discussed the [employer] fitness program, which is designed for cancer survivors to help them become more physically fit after cancer treatments.  She was instructed to limit her alcohol consumption and continue to abstain from tobacco use.     5. Support services/counseling: It is not uncommon for this period of the patient's cancer care trajectory to be one of many emotions and stressors.  We discussed an opportunity for her to participate in the next session of [facility] ("Finding Your New Normal") support group series designed for patients after they have completed treatment.   Cassandra James was encouraged to take advantage of our many other support services programs, support groups, and/or counseling in coping with her new life as a cancer survivor after completing anti-cancer treatment.  She was offered support today through active listening and expressive supportive counseling.  She was given information regarding our available services and encouraged to  contact me with any questions or for help enrolling in any of our support group/programs.    Dispo:   -Return to see oncology in 6 months -Mammogram due in 04/2018 -Bone density 07/2019 -She is welcome to return back to the Survivorship Clinic at any time; no additional follow-up needed at this time.  -Consider referral back to survivorship as a long-term survivor for continued  surveillance  A total of (30) minutes of face-to-face time was spent with this patient with greater than 50% of that time in counseling and care-coordination.   Gardenia Phlegm, [place] [phone]   Note: PRIMARY CARE PROVIDER Lucretia Kern, [place] [phone]

## 2017-10-23 NOTE — Patient Instructions (Signed)
BEFORE YOU LEAVE: -flu shot -labs -follow up: with your new doctor in [place] and as needed here in the interim  We will miss you!  We have ordered labs or studies at this visit. It can take up to 1-2 weeks for results and processing. IF results require follow up or explanation, we will call you with instructions. Clinically stable results will be released to your [facility]. If you have not heard from [place] or cannot find your results in [facility] in 2 weeks please contact our office at [phone].  If you are not yet signed up for [facility], please consider signing up.

## 2017-10-23 NOTE — Progress Notes (Signed)
HPI:  Using dictation device. Unfortunately this device frequently misinterprets words/phrases.  Cassandra James is a pleasant 72 y.o. here for follow up. Chronic medical problems summarized below were reviewed for changes and stability and were updated as needed below. These issues and their treatment remain stable for the most part. Denies CP, SOB, DOE, treatment intolerance or new symptoms. Due for flu shot, eye exam, cbc/bmp  Breast Ca: -seeing oncology for management -discovered 04/2017 on screening mammo -L breast, grade 1 IDC ER 100, PR 60, HER-2 neg; T1 aN0 stage 1 -s/p L lumpectomy 09/24/17; s/p radiation, currently on Letrozole  HTN: -meds: hctz, telmisartan  Hyperglycemia: -lifetsyle recs  COPD/Asthma: -uses prn albuterol  Hair loss: -sees Dr. Mariel Kansky at  [place]: See pertinent positives and negatives per HPI.  Past Medical History:  Diagnosis Date  . Cancer (HCC)    LT BREAST  . Cataract   . Colon polyps    history of  . COLONIC POLYPS, HX OF 08/09/2006   Qualifier: Diagnosis of  By: Scherrie Gerlach    . COPD (chronic obstructive pulmonary disease) (HCC)    BORDERLINE  SEES DR. Melvyn Novas PRN  . DECREASED HEARING 11/25/2008   Qualifier: Diagnosis of  By: Scherrie Gerlach    . Dyspnea    W/ EXERTION   . Elevated LFTs    obstructive disease  . 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 HIATAL HERNIA 02/05/2009   Qualifier: Diagnosis of  By: Harriet Pho    . Hyperglycemia 11/10/2013  . Hyperlipidemia   . Hypertension   . Osteoporosis    was osteopenia-last bone scan all clear   . Pernicious anemia   . Pneumonia    2018    Past Surgical History:  Procedure Laterality Date  . APPENDECTOMY  1968   incendental  . BREAST LUMPECTOMY WITH RADIOACTIVE SEED LOCALIZATION Left 05/24/2017   Procedure: BREAST LUMPECTOMY WITH RADIOACTIVE SEED LOCALIZATION;  Surgeon: Rolm Bookbinder, MD;  Location: [place];  Service: General;  Laterality: Left;  . CARPAL TUNNEL  RELEASE Left 2016  . CATARACT EXTRACTION W/ INTRAOCULAR LENS  IMPLANT, BILATERAL     2009  . cataract surgery  2011   Bilateral  . COLONOSCOPY    . HAND TENDON SURGERY  01/16/2014   Dr Burney Gauze  . OVARIAN CYST SURGERY  1968  . RETINAL DETACHMENT SURGERY  2011   left  . TOENAIL EXCISION Right    Dr Patrick Jupiter great toenail excision  . TRAPEZIUM RESECTION Left 2016   left wrist   . TUBAL LIGATION  1979    Family History  Problem Relation Age of Onset  . Pernicious anemia Mother   . Cancer Father        lung/ bone  . Osteoporosis Father   . Hypothyroidism Father   . Colon cancer Sister   . Cancer - Colon Sister   . Pancreatic cancer Maternal Grandfather   . Colon polyps Neg Hx   . Rectal cancer Neg Hx   . Stomach cancer Neg Hx     SOCIAL HX: see hpi   Current Outpatient Medications:  .  albuterol (PROAIR HFA) 108 (90 Base) MCG/ACT inhaler, 2 puffs every 4 hours as needed, Disp: 1 Inhaler, Rfl: 1 .  cholecalciferol (VITAMIN D) 1000 units tablet, Take 1,000 Units by mouth daily., Disp: , Rfl:  .  cyanocobalamin (,VITAMIN B-12,) 1000 MCG/ML injection, INJECT 1ML INTO THE MUSCLE EVERY 30 DAYS (Patient  taking differently: Inject 1,000 mcg into the muscle every 30 (thirty) days. ), Disp: 12 mL, Rfl: 3 .  hydrochlorothiazide (HYDRODIURIL) 25 MG tablet, TAKE 1 TABLET BY MOUTH EVERY DAY, Disp: 90 tablet, Rfl: 1 .  hyoscyamine (LEVSIN SL) 0.125 MG SL tablet, Place 2 tablets (0.25 mg total) under the tongue every 4 (four) hours as needed for cramping., Disp: 90 tablet, Rfl: 0 .  letrozole (FEMARA) 2.5 MG tablet, Take 1 tablet (2.5 mg total) by mouth daily., Disp: 90 tablet, Rfl: 3 .  naproxen sodium (ALEVE) 220 MG tablet, Take 220 mg by mouth 2 (two) times daily as needed (for pain or headache)., Disp: , Rfl:  .  Omega-3 Fatty Acids (FISH OIL) 1200 MG CAPS, Take 1,200 mg by mouth daily., Disp: , Rfl:  .  potassium chloride SA (KLOR-CON M20) 20 MEQ tablet, Take 1 tablet (20 mEq total) by  mouth daily., Disp: 90 tablet, Rfl: 1 .  rosuvastatin (CRESTOR) 10 MG tablet, Take 1 tablet (10 mg total) by mouth daily., Disp: 90 tablet, Rfl: 3 .  telmisartan (MICARDIS) 40 MG tablet, Take 1 tablet (40 mg total) by mouth daily., Disp: 90 tablet, Rfl: 1  EXAM:  Vitals:   10/23/17 1315  BP: 112/80  Pulse: 64  Temp: 97.9 F (36.6 C)    Body mass index is 31.3 kg/m.  GENERAL: vitals reviewed and listed above, alert, oriented, appears well hydrated and in no acute distress  HEENT: atraumatic, conjunttiva clear, no obvious abnormalities on inspection of external nose and ears  NECK: no obvious masses on inspection  LUNGS: clear to auscultation bilaterally, no wheezes, rales or rhonchi, good air movement  CV: HRRR, no peripheral edema  MS: moves all extremities without noticeable abnormality  PSYCH: pleasant and cooperative, no obvious depression or anxiety  ASSESSMENT AND PLAN:  Discussed the following assessment and plan:  Essential hypertension - Plan: Basic metabolic panel, CBC  Hyperlipidemia, unspecified hyperlipidemia type  Hyperglycemia  Malignant neoplasm of upper-inner quadrant of left breast in female, estrogen receptor positive (HCC)  Need for immunization against influenza - Plan: Flu vaccine HIGH DOSE PF (Fluzone High dose)  -labs -flu shot -refills -she is moving and will transition in December, plans to transition to PCP/oncologist in [place] - will provide 90 days refills to get to new PCP and of course she can follow up here until transition if any concerns - will miss them -lifestyle recs -Patient advised to return or notify a doctor immediately if symptoms worsen or persist or new concerns arise.  Patient Instructions  BEFORE YOU LEAVE: -flu shot -labs -follow up: with your new doctor in [place] and as needed here in the interim  We will miss you!  We have ordered labs or studies at this visit. It can take up to 1-2 weeks for results and  processing. IF results require follow up or explanation, we will call you with instructions. Clinically stable results will be released to your [facility]. If you have not heard from [place] or cannot find your results in [facility] in 2 weeks please contact our office at [phone].  If you are not yet signed up for [facility], please consider signing up.           Lucretia Kern, DO

## 2017-10-23 NOTE — Telephone Encounter (Signed)
Printed medical records per patient's request and left a vm @ [phone] asking her to call me back to let her know that her records are ready.

## 2017-10-23 NOTE — Telephone Encounter (Signed)
Per 10/8 los, no orders.

## 2017-10-24 DIAGNOSIS — R69 Illness, unspecified: Secondary | ICD-10-CM | POA: Diagnosis not present

## 2017-11-09 ENCOUNTER — Encounter: Payer: Self-pay | Admitting: Family Medicine

## 2017-11-22 ENCOUNTER — Other Ambulatory Visit: Payer: Self-pay | Admitting: Family Medicine

## 2017-11-25 ENCOUNTER — Other Ambulatory Visit: Payer: Self-pay | Admitting: Family Medicine

## 2017-11-27 ENCOUNTER — Other Ambulatory Visit: Payer: Self-pay | Admitting: *Deleted

## 2017-11-27 MED ORDER — POTASSIUM CHLORIDE CRYS ER 20 MEQ PO TBCR
20.0000 meq | EXTENDED_RELEASE_TABLET | Freq: Every day | ORAL | 1 refills | Status: DC
Start: 2017-11-27 — End: 2018-05-31

## 2017-11-27 NOTE — Telephone Encounter (Signed)
Rx done. 

## 2018-02-05 ENCOUNTER — Telehealth: Payer: Self-pay | Admitting: *Deleted

## 2018-02-05 NOTE — Telephone Encounter (Signed)
Medical records faxed to [facility]; [phone]

## 2018-02-23 ENCOUNTER — Telehealth: Payer: Self-pay | Admitting: Family Medicine

## 2018-03-08 NOTE — Telephone Encounter (Signed)
Luther Redo with [employer] calling regarding PA for Albuterol. Needing clinical information.   Needing DX for the inhaler. Has pt trialed and failed ProAir inhaler? Case # [id number]  Call back # [phone]

## 2018-03-11 NOTE — Telephone Encounter (Signed)
Forms from [facility] were completed and faxed to [phone].

## 2018-03-17 ENCOUNTER — Other Ambulatory Visit: Payer: Self-pay | Admitting: Family Medicine

## 2018-03-19 ENCOUNTER — Other Ambulatory Visit: Payer: Self-pay | Admitting: Family Medicine

## 2018-03-20 NOTE — Telephone Encounter (Signed)
Fax received from [facility] stating the request was approved from 03/08/18-01/16/19.  I called [facility] in [place] and left a detailed message on the voicemail with this info.

## 2018-03-23 ENCOUNTER — Other Ambulatory Visit: Payer: Self-pay | Admitting: Family Medicine

## 2018-04-16 ENCOUNTER — Telehealth: Payer: Self-pay

## 2018-04-16 NOTE — Telephone Encounter (Signed)
Author phoned pt. to assess interest in scheduling virtual awv. No answer, author left detailed VM asking for return call.   

## 2018-05-31 ENCOUNTER — Other Ambulatory Visit: Payer: Self-pay | Admitting: Family Medicine

## 2018-06-14 ENCOUNTER — Other Ambulatory Visit: Payer: Self-pay | Admitting: Family Medicine

## 2018-08-25 ENCOUNTER — Other Ambulatory Visit: Payer: Self-pay | Admitting: Family Medicine

## 2018-09-09 ENCOUNTER — Other Ambulatory Visit: Payer: Self-pay | Admitting: Family Medicine

## 2018-09-15 ENCOUNTER — Other Ambulatory Visit: Payer: Self-pay | Admitting: Family Medicine

## 2018-09-21 ENCOUNTER — Other Ambulatory Visit: Payer: Self-pay | Admitting: Family Medicine

## 2018-10-21 ENCOUNTER — Other Ambulatory Visit: Payer: Self-pay | Admitting: Family Medicine

## 2018-11-17 ENCOUNTER — Other Ambulatory Visit: Payer: Self-pay | Admitting: Family Medicine

## 2018-12-26 ENCOUNTER — Other Ambulatory Visit: Payer: Self-pay | Admitting: Family Medicine

## 2018-12-31 ENCOUNTER — Other Ambulatory Visit: Payer: Self-pay | Admitting: Family Medicine

## 2019-03-18 ENCOUNTER — Other Ambulatory Visit: Payer: Self-pay | Admitting: Family Medicine

## 2022-08-08 ENCOUNTER — Encounter (HOSPITAL_COMMUNITY): Payer: Self-pay
